# Patient Record
Sex: Male | Born: 1940 | Race: Black or African American | Hispanic: No | Smoking: Former smoker
Health system: Southern US, Community
[De-identification: ages and names within clinical notes are randomized; demographics above are authoritative.]

## PROBLEM LIST (undated history)

## (undated) DIAGNOSIS — H409 Unspecified glaucoma: Secondary | ICD-10-CM

## (undated) DIAGNOSIS — G629 Polyneuropathy, unspecified: Secondary | ICD-10-CM

## (undated) DIAGNOSIS — Z8679 Personal history of other diseases of the circulatory system: Secondary | ICD-10-CM

## (undated) DIAGNOSIS — D649 Anemia, unspecified: Secondary | ICD-10-CM

## (undated) DIAGNOSIS — I252 Old myocardial infarction: Secondary | ICD-10-CM

## (undated) DIAGNOSIS — E119 Type 2 diabetes mellitus without complications: Secondary | ICD-10-CM

## (undated) DIAGNOSIS — E785 Hyperlipidemia, unspecified: Secondary | ICD-10-CM

## (undated) DIAGNOSIS — I10 Essential (primary) hypertension: Secondary | ICD-10-CM

## (undated) DIAGNOSIS — I739 Peripheral vascular disease, unspecified: Secondary | ICD-10-CM

## (undated) DIAGNOSIS — N529 Male erectile dysfunction, unspecified: Secondary | ICD-10-CM

## (undated) DIAGNOSIS — D126 Benign neoplasm of colon, unspecified: Secondary | ICD-10-CM

## (undated) DIAGNOSIS — Z89511 Acquired absence of right leg below knee: Secondary | ICD-10-CM

## (undated) DIAGNOSIS — I679 Cerebrovascular disease, unspecified: Secondary | ICD-10-CM

## (undated) HISTORY — DX: Old myocardial infarction: I25.2

## (undated) HISTORY — DX: Male erectile dysfunction, unspecified: N52.9

## (undated) HISTORY — DX: Anemia, unspecified: D64.9

## (undated) HISTORY — DX: Essential (primary) hypertension: I10

## (undated) HISTORY — DX: Type 2 diabetes mellitus without complications: E11.9

## (undated) HISTORY — DX: Hyperlipidemia, unspecified: E78.5

## (undated) HISTORY — DX: Peripheral vascular disease, unspecified: I73.9

## (undated) HISTORY — DX: Personal history of other diseases of the circulatory system: Z86.79

## (undated) HISTORY — DX: Cerebrovascular disease, unspecified: I67.9

## (undated) HISTORY — DX: Unspecified glaucoma: H40.9

## (undated) HISTORY — PX: CHOLECYSTECTOMY: SHX55

## (undated) HISTORY — DX: Benign neoplasm of colon, unspecified: D12.6

## (undated) HISTORY — DX: Polyneuropathy, unspecified: G62.9

---

## 2000-12-16 HISTORY — PX: CORONARY ARTERY BYPASS GRAFT: SHX141

## 2001-03-11 ENCOUNTER — Encounter: Payer: Self-pay | Admitting: Cardiology

## 2001-03-11 ENCOUNTER — Inpatient Hospital Stay (HOSPITAL_COMMUNITY): Admission: RE | Admit: 2001-03-11 | Discharge: 2001-03-24 | Payer: Self-pay | Admitting: Cardiology

## 2001-03-14 ENCOUNTER — Encounter: Payer: Self-pay | Admitting: Cardiology

## 2001-03-16 ENCOUNTER — Encounter: Payer: Self-pay | Admitting: Thoracic Surgery (Cardiothoracic Vascular Surgery)

## 2001-03-17 ENCOUNTER — Encounter: Payer: Self-pay | Admitting: Thoracic Surgery (Cardiothoracic Vascular Surgery)

## 2001-03-18 ENCOUNTER — Encounter: Payer: Self-pay | Admitting: Surgery

## 2001-04-03 ENCOUNTER — Encounter: Payer: Self-pay | Admitting: Surgery

## 2001-04-03 ENCOUNTER — Inpatient Hospital Stay (HOSPITAL_COMMUNITY): Admission: AD | Admit: 2001-04-03 | Discharge: 2001-04-20 | Payer: Self-pay | Admitting: Surgery

## 2001-04-05 ENCOUNTER — Encounter: Payer: Self-pay | Admitting: Surgery

## 2001-04-06 ENCOUNTER — Encounter: Payer: Self-pay | Admitting: Surgery

## 2001-04-07 ENCOUNTER — Encounter: Payer: Self-pay | Admitting: Surgery

## 2001-04-08 ENCOUNTER — Encounter: Payer: Self-pay | Admitting: Surgery

## 2001-04-09 ENCOUNTER — Encounter: Payer: Self-pay | Admitting: Surgery

## 2001-04-10 ENCOUNTER — Encounter: Payer: Self-pay | Admitting: Surgery

## 2001-04-11 ENCOUNTER — Encounter: Payer: Self-pay | Admitting: Surgery

## 2001-04-12 ENCOUNTER — Encounter: Payer: Self-pay | Admitting: Thoracic Surgery (Cardiothoracic Vascular Surgery)

## 2001-04-13 ENCOUNTER — Encounter: Payer: Self-pay | Admitting: Thoracic Surgery (Cardiothoracic Vascular Surgery)

## 2001-04-13 ENCOUNTER — Encounter: Payer: Self-pay | Admitting: Surgery

## 2001-04-14 ENCOUNTER — Encounter: Payer: Self-pay | Admitting: Surgery

## 2001-04-15 ENCOUNTER — Encounter: Payer: Self-pay | Admitting: Surgery

## 2001-04-16 ENCOUNTER — Encounter: Payer: Self-pay | Admitting: Surgery

## 2001-04-28 ENCOUNTER — Encounter: Payer: Self-pay | Admitting: Surgery

## 2001-04-28 ENCOUNTER — Encounter: Admission: RE | Admit: 2001-04-28 | Discharge: 2001-04-28 | Payer: Self-pay | Admitting: Surgery

## 2001-05-05 ENCOUNTER — Encounter (HOSPITAL_COMMUNITY): Admission: RE | Admit: 2001-05-05 | Discharge: 2001-06-04 | Payer: Self-pay | Admitting: Cardiology

## 2001-05-31 ENCOUNTER — Encounter: Payer: Self-pay | Admitting: Internal Medicine

## 2001-05-31 ENCOUNTER — Emergency Department (HOSPITAL_COMMUNITY): Admission: EM | Admit: 2001-05-31 | Discharge: 2001-05-31 | Payer: Self-pay | Admitting: Internal Medicine

## 2001-06-05 ENCOUNTER — Encounter: Payer: Self-pay | Admitting: Surgery

## 2001-06-05 ENCOUNTER — Inpatient Hospital Stay (HOSPITAL_COMMUNITY): Admission: AD | Admit: 2001-06-05 | Discharge: 2001-06-14 | Payer: Self-pay | Admitting: Surgery

## 2002-12-16 HISTORY — PX: INGUINAL HERNIA REPAIR: SUR1180

## 2003-08-29 ENCOUNTER — Encounter: Payer: Self-pay | Admitting: Cardiology

## 2003-09-14 ENCOUNTER — Ambulatory Visit (HOSPITAL_COMMUNITY): Admission: RE | Admit: 2003-09-14 | Discharge: 2003-09-14 | Payer: Self-pay | Admitting: General Surgery

## 2003-11-17 ENCOUNTER — Ambulatory Visit (HOSPITAL_COMMUNITY): Admission: RE | Admit: 2003-11-17 | Discharge: 2003-11-18 | Payer: Self-pay | Admitting: Ophthalmology

## 2004-10-25 ENCOUNTER — Ambulatory Visit: Payer: Self-pay | Admitting: Cardiology

## 2005-01-09 ENCOUNTER — Ambulatory Visit: Payer: Self-pay | Admitting: Cardiology

## 2005-01-21 ENCOUNTER — Ambulatory Visit: Payer: Self-pay | Admitting: Cardiology

## 2005-01-24 ENCOUNTER — Ambulatory Visit: Payer: Self-pay

## 2005-01-29 ENCOUNTER — Ambulatory Visit: Payer: Self-pay | Admitting: Cardiology

## 2005-07-10 ENCOUNTER — Emergency Department (HOSPITAL_COMMUNITY): Admission: EM | Admit: 2005-07-10 | Discharge: 2005-07-10 | Payer: Self-pay | Admitting: Emergency Medicine

## 2005-09-17 ENCOUNTER — Ambulatory Visit: Payer: Self-pay | Admitting: Cardiology

## 2005-10-02 ENCOUNTER — Ambulatory Visit: Payer: Self-pay | Admitting: *Deleted

## 2005-10-28 ENCOUNTER — Ambulatory Visit: Payer: Self-pay | Admitting: Cardiology

## 2006-02-03 ENCOUNTER — Ambulatory Visit: Payer: Self-pay | Admitting: Cardiology

## 2006-04-10 ENCOUNTER — Ambulatory Visit: Payer: Self-pay | Admitting: Cardiology

## 2006-04-11 ENCOUNTER — Ambulatory Visit (HOSPITAL_COMMUNITY): Admission: RE | Admit: 2006-04-11 | Discharge: 2006-04-11 | Payer: Self-pay | Admitting: Cardiology

## 2006-04-14 ENCOUNTER — Ambulatory Visit: Payer: Self-pay | Admitting: Family Medicine

## 2006-04-28 ENCOUNTER — Ambulatory Visit: Payer: Self-pay | Admitting: Family Medicine

## 2006-05-13 ENCOUNTER — Ambulatory Visit: Payer: Self-pay | Admitting: Family Medicine

## 2006-06-10 ENCOUNTER — Ambulatory Visit: Payer: Self-pay | Admitting: Family Medicine

## 2006-06-12 ENCOUNTER — Ambulatory Visit (HOSPITAL_COMMUNITY): Admission: RE | Admit: 2006-06-12 | Discharge: 2006-06-12 | Payer: Self-pay | Admitting: General Surgery

## 2006-06-12 ENCOUNTER — Encounter (INDEPENDENT_AMBULATORY_CARE_PROVIDER_SITE_OTHER): Payer: Self-pay | Admitting: *Deleted

## 2006-07-15 ENCOUNTER — Ambulatory Visit: Payer: Self-pay | Admitting: Family Medicine

## 2006-07-31 ENCOUNTER — Ambulatory Visit: Payer: Self-pay | Admitting: Cardiology

## 2006-08-15 ENCOUNTER — Ambulatory Visit: Payer: Self-pay | Admitting: Family Medicine

## 2006-09-01 ENCOUNTER — Ambulatory Visit: Payer: Self-pay | Admitting: Cardiology

## 2006-09-12 ENCOUNTER — Ambulatory Visit: Payer: Self-pay | Admitting: Family Medicine

## 2006-12-19 DIAGNOSIS — H409 Unspecified glaucoma: Secondary | ICD-10-CM | POA: Insufficient documentation

## 2006-12-19 DIAGNOSIS — G609 Hereditary and idiopathic neuropathy, unspecified: Secondary | ICD-10-CM | POA: Insufficient documentation

## 2006-12-19 DIAGNOSIS — I10 Essential (primary) hypertension: Secondary | ICD-10-CM | POA: Insufficient documentation

## 2006-12-19 DIAGNOSIS — E785 Hyperlipidemia, unspecified: Secondary | ICD-10-CM

## 2006-12-19 DIAGNOSIS — I252 Old myocardial infarction: Secondary | ICD-10-CM

## 2006-12-19 DIAGNOSIS — I739 Peripheral vascular disease, unspecified: Secondary | ICD-10-CM

## 2007-01-12 ENCOUNTER — Ambulatory Visit: Payer: Self-pay | Admitting: Family Medicine

## 2007-02-02 ENCOUNTER — Encounter (INDEPENDENT_AMBULATORY_CARE_PROVIDER_SITE_OTHER): Payer: Self-pay | Admitting: Family Medicine

## 2007-02-03 ENCOUNTER — Telehealth (INDEPENDENT_AMBULATORY_CARE_PROVIDER_SITE_OTHER): Payer: Self-pay | Admitting: Family Medicine

## 2007-02-03 LAB — CONVERTED CEMR LAB
ALT: 15 units/L (ref 0–53)
Basophils Absolute: 0 10*3/uL (ref 0.0–0.1)
Basophils Relative: 0 % (ref 0–1)
Calcium: 9.6 mg/dL (ref 8.4–10.5)
Chloride: 103 meq/L (ref 96–112)
Cholesterol: 133 mg/dL (ref 0–200)
Creatinine, Ser: 1.08 mg/dL (ref 0.40–1.50)
Glucose, Bld: 75 mg/dL (ref 70–99)
HCT: 42.2 % (ref 39.0–52.0)
Hemoglobin: 13.2 g/dL (ref 13.0–17.0)
LDL Cholesterol: 75 mg/dL (ref 0–99)
Lymphocytes Relative: 34 % (ref 12–46)
Lymphs Abs: 2.1 10*3/uL (ref 0.7–3.3)
MCHC: 31.3 g/dL (ref 30.0–36.0)
MCV: 98.4 fL (ref 78.0–100.0)
Monocytes Absolute: 0.4 10*3/uL (ref 0.2–0.7)
Neutrophils Relative %: 53 % (ref 43–77)
Platelets: 193 10*3/uL (ref 150–400)
Total Bilirubin: 1.4 mg/dL — ABNORMAL HIGH (ref 0.3–1.2)
Triglycerides: 54 mg/dL (ref <150)
VLDL: 11 mg/dL (ref 0–40)

## 2007-02-09 ENCOUNTER — Ambulatory Visit: Payer: Self-pay | Admitting: Family Medicine

## 2007-02-19 ENCOUNTER — Encounter (INDEPENDENT_AMBULATORY_CARE_PROVIDER_SITE_OTHER): Payer: Self-pay | Admitting: Family Medicine

## 2007-03-20 ENCOUNTER — Encounter (INDEPENDENT_AMBULATORY_CARE_PROVIDER_SITE_OTHER): Payer: Self-pay | Admitting: Family Medicine

## 2007-05-20 ENCOUNTER — Encounter (INDEPENDENT_AMBULATORY_CARE_PROVIDER_SITE_OTHER): Payer: Self-pay | Admitting: Family Medicine

## 2007-06-18 ENCOUNTER — Encounter (INDEPENDENT_AMBULATORY_CARE_PROVIDER_SITE_OTHER): Payer: Self-pay | Admitting: Family Medicine

## 2007-06-22 ENCOUNTER — Encounter (INDEPENDENT_AMBULATORY_CARE_PROVIDER_SITE_OTHER): Payer: Self-pay | Admitting: Family Medicine

## 2007-08-14 ENCOUNTER — Encounter (INDEPENDENT_AMBULATORY_CARE_PROVIDER_SITE_OTHER): Payer: Self-pay | Admitting: Family Medicine

## 2007-09-17 ENCOUNTER — Encounter (INDEPENDENT_AMBULATORY_CARE_PROVIDER_SITE_OTHER): Payer: Self-pay | Admitting: Family Medicine

## 2007-09-18 ENCOUNTER — Ambulatory Visit: Payer: Self-pay | Admitting: Family Medicine

## 2007-09-18 LAB — CONVERTED CEMR LAB
Glucose, Bld: 195 mg/dL
Hgb A1c MFr Bld: 6.6 %

## 2007-09-19 ENCOUNTER — Encounter (INDEPENDENT_AMBULATORY_CARE_PROVIDER_SITE_OTHER): Payer: Self-pay | Admitting: Family Medicine

## 2007-09-21 ENCOUNTER — Encounter (INDEPENDENT_AMBULATORY_CARE_PROVIDER_SITE_OTHER): Payer: Self-pay | Admitting: Family Medicine

## 2007-09-21 LAB — CONVERTED CEMR LAB
ALT: 17 units/L (ref 0–53)
AST: 21 units/L (ref 0–37)
Albumin: 4 g/dL (ref 3.5–5.2)
BUN: 11 mg/dL (ref 6–23)
Basophils Absolute: 0 10*3/uL (ref 0.0–0.1)
Basophils Relative: 1 % (ref 0–1)
Calcium: 8.4 mg/dL (ref 8.4–10.5)
Creatinine, Ser: 1.07 mg/dL (ref 0.40–1.50)
Eosinophils Absolute: 0.4 10*3/uL (ref 0.0–0.7)
Glucose, Bld: 194 mg/dL — ABNORMAL HIGH (ref 70–99)
MCV: 94.6 fL (ref 78.0–100.0)
Microalb Creat Ratio: 3.1 mg/g (ref 0.0–30.0)
Microalb, Ur: 0.21 mg/dL (ref 0.00–1.89)
Potassium: 4.3 meq/L (ref 3.5–5.3)
RBC: 4.11 M/uL — ABNORMAL LOW (ref 4.22–5.81)
Total Bilirubin: 1 mg/dL (ref 0.3–1.2)
WBC: 6 10*3/uL (ref 4.0–10.5)

## 2007-10-09 ENCOUNTER — Ambulatory Visit: Payer: Self-pay | Admitting: Family Medicine

## 2007-10-09 DIAGNOSIS — D649 Anemia, unspecified: Secondary | ICD-10-CM | POA: Insufficient documentation

## 2007-10-13 ENCOUNTER — Encounter (INDEPENDENT_AMBULATORY_CARE_PROVIDER_SITE_OTHER): Payer: Self-pay | Admitting: Family Medicine

## 2007-10-13 ENCOUNTER — Telehealth (INDEPENDENT_AMBULATORY_CARE_PROVIDER_SITE_OTHER): Payer: Self-pay | Admitting: *Deleted

## 2007-10-16 ENCOUNTER — Ambulatory Visit (HOSPITAL_COMMUNITY): Admission: RE | Admit: 2007-10-16 | Discharge: 2007-10-16 | Payer: Self-pay | Admitting: Family Medicine

## 2007-10-19 ENCOUNTER — Telehealth (INDEPENDENT_AMBULATORY_CARE_PROVIDER_SITE_OTHER): Payer: Self-pay | Admitting: *Deleted

## 2007-10-19 ENCOUNTER — Ambulatory Visit: Payer: Self-pay | Admitting: Family Medicine

## 2007-10-19 ENCOUNTER — Ambulatory Visit (HOSPITAL_COMMUNITY): Admission: RE | Admit: 2007-10-19 | Discharge: 2007-10-19 | Payer: Self-pay | Admitting: Family Medicine

## 2007-10-19 DIAGNOSIS — K4090 Unilateral inguinal hernia, without obstruction or gangrene, not specified as recurrent: Secondary | ICD-10-CM | POA: Insufficient documentation

## 2007-10-20 ENCOUNTER — Telehealth (INDEPENDENT_AMBULATORY_CARE_PROVIDER_SITE_OTHER): Payer: Self-pay | Admitting: *Deleted

## 2007-11-02 ENCOUNTER — Ambulatory Visit: Payer: Self-pay | Admitting: Family Medicine

## 2007-11-16 ENCOUNTER — Ambulatory Visit: Payer: Self-pay | Admitting: Surgery

## 2007-11-16 ENCOUNTER — Encounter (INDEPENDENT_AMBULATORY_CARE_PROVIDER_SITE_OTHER): Payer: Self-pay | Admitting: Family Medicine

## 2008-01-15 ENCOUNTER — Encounter (INDEPENDENT_AMBULATORY_CARE_PROVIDER_SITE_OTHER): Payer: Self-pay | Admitting: Family Medicine

## 2008-01-21 ENCOUNTER — Encounter (INDEPENDENT_AMBULATORY_CARE_PROVIDER_SITE_OTHER): Payer: Self-pay | Admitting: Family Medicine

## 2008-02-02 ENCOUNTER — Encounter (INDEPENDENT_AMBULATORY_CARE_PROVIDER_SITE_OTHER): Payer: Self-pay | Admitting: Family Medicine

## 2008-02-16 ENCOUNTER — Encounter (INDEPENDENT_AMBULATORY_CARE_PROVIDER_SITE_OTHER): Payer: Self-pay | Admitting: Family Medicine

## 2008-03-13 ENCOUNTER — Emergency Department (HOSPITAL_COMMUNITY): Admission: EM | Admit: 2008-03-13 | Discharge: 2008-03-13 | Payer: Self-pay | Admitting: Emergency Medicine

## 2008-03-16 ENCOUNTER — Encounter (INDEPENDENT_AMBULATORY_CARE_PROVIDER_SITE_OTHER): Payer: Self-pay | Admitting: Family Medicine

## 2008-04-15 ENCOUNTER — Ambulatory Visit: Payer: Self-pay | Admitting: Family Medicine

## 2008-04-15 LAB — CONVERTED CEMR LAB
Blood in Urine, dipstick: NEGATIVE
Hgb A1c MFr Bld: 8 %
WBC Urine, dipstick: NEGATIVE

## 2008-04-25 ENCOUNTER — Encounter (INDEPENDENT_AMBULATORY_CARE_PROVIDER_SITE_OTHER): Payer: Self-pay | Admitting: Family Medicine

## 2008-04-26 LAB — CONVERTED CEMR LAB
ALT: 14 units/L (ref 0–53)
AST: 18 units/L (ref 0–37)
Basophils Relative: 0 % (ref 0–1)
CO2: 26 meq/L (ref 19–32)
Cholesterol: 120 mg/dL (ref 0–200)
Creatinine, Ser: 1.03 mg/dL (ref 0.40–1.50)
Creatinine, Urine: 247.9 mg/dL
Eosinophils Relative: 6 % — ABNORMAL HIGH (ref 0–5)
HCT: 37.4 % — ABNORMAL LOW (ref 39.0–52.0)
Lymphocytes Relative: 36 % (ref 12–46)
Lymphs Abs: 1.9 10*3/uL (ref 0.7–4.0)
MCHC: 31.8 g/dL (ref 30.0–36.0)
MCV: 94.4 fL (ref 78.0–100.0)
Microalb Creat Ratio: 4.2 mg/g (ref 0.0–30.0)
Monocytes Absolute: 0.5 10*3/uL (ref 0.1–1.0)
Monocytes Relative: 9 % (ref 3–12)
Neutro Abs: 2.6 10*3/uL (ref 1.7–7.7)
Neutrophils Relative %: 49 % (ref 43–77)
Platelets: 179 10*3/uL (ref 150–400)
RDW: 13 % (ref 11.5–15.5)
Total CHOL/HDL Ratio: 3
Total Protein: 6.3 g/dL (ref 6.0–8.3)
VLDL: 13 mg/dL (ref 0–40)

## 2008-04-29 ENCOUNTER — Ambulatory Visit: Payer: Self-pay | Admitting: Family Medicine

## 2008-04-29 LAB — CONVERTED CEMR LAB: Glucose, Bld: 216 mg/dL

## 2008-05-27 ENCOUNTER — Ambulatory Visit: Payer: Self-pay | Admitting: Family Medicine

## 2008-05-27 DIAGNOSIS — F528 Other sexual dysfunction not due to a substance or known physiological condition: Secondary | ICD-10-CM | POA: Insufficient documentation

## 2008-05-31 LAB — CONVERTED CEMR LAB
Eosinophils Absolute: 0.2 10*3/uL (ref 0.0–0.7)
Eosinophils Relative: 4 % (ref 0–5)
Hemoglobin: 13.1 g/dL (ref 13.0–17.0)
Lymphs Abs: 1.8 10*3/uL (ref 0.7–4.0)
MCV: 94.4 fL (ref 78.0–100.0)
Monocytes Absolute: 0.5 10*3/uL (ref 0.1–1.0)
Neutrophils Relative %: 57 % (ref 43–77)
Platelets: 197 10*3/uL (ref 150–400)
Retic Ct Pct: 1.5 % (ref 0.4–3.1)
Saturation Ratios: 30 % (ref 20–55)
WBC: 5.7 10*3/uL (ref 4.0–10.5)

## 2008-06-01 ENCOUNTER — Encounter (INDEPENDENT_AMBULATORY_CARE_PROVIDER_SITE_OTHER): Payer: Self-pay | Admitting: Family Medicine

## 2008-08-02 ENCOUNTER — Ambulatory Visit: Payer: Self-pay | Admitting: Family Medicine

## 2008-08-16 ENCOUNTER — Ambulatory Visit: Payer: Self-pay | Admitting: Family Medicine

## 2008-08-30 ENCOUNTER — Encounter (INDEPENDENT_AMBULATORY_CARE_PROVIDER_SITE_OTHER): Payer: Self-pay | Admitting: Family Medicine

## 2008-09-09 ENCOUNTER — Encounter (INDEPENDENT_AMBULATORY_CARE_PROVIDER_SITE_OTHER): Payer: Self-pay | Admitting: Family Medicine

## 2008-11-22 ENCOUNTER — Ambulatory Visit: Payer: Self-pay | Admitting: Family Medicine

## 2008-11-23 ENCOUNTER — Encounter (INDEPENDENT_AMBULATORY_CARE_PROVIDER_SITE_OTHER): Payer: Self-pay | Admitting: Family Medicine

## 2008-11-24 LAB — CONVERTED CEMR LAB
ALT: 53 units/L (ref 0–53)
AST: 46 units/L — ABNORMAL HIGH (ref 0–37)
Albumin: 4.1 g/dL (ref 3.5–5.2)
Alkaline Phosphatase: 94 units/L (ref 39–117)
CO2: 24 meq/L (ref 19–32)
Creatinine, Ser: 0.95 mg/dL (ref 0.40–1.50)
Glucose, Bld: 132 mg/dL — ABNORMAL HIGH (ref 70–99)
PSA: 0.74 ng/mL (ref 0.10–4.00)
Total Bilirubin: 0.9 mg/dL (ref 0.3–1.2)

## 2009-01-11 ENCOUNTER — Encounter (INDEPENDENT_AMBULATORY_CARE_PROVIDER_SITE_OTHER): Payer: Self-pay | Admitting: Family Medicine

## 2009-03-22 ENCOUNTER — Encounter (INDEPENDENT_AMBULATORY_CARE_PROVIDER_SITE_OTHER): Payer: Self-pay | Admitting: Family Medicine

## 2009-03-23 ENCOUNTER — Ambulatory Visit: Payer: Self-pay | Admitting: Family Medicine

## 2009-03-23 LAB — CONVERTED CEMR LAB
Glucose, Bld: 271 mg/dL
Hgb A1c MFr Bld: 7 %

## 2009-04-04 ENCOUNTER — Encounter (INDEPENDENT_AMBULATORY_CARE_PROVIDER_SITE_OTHER): Payer: Self-pay | Admitting: Family Medicine

## 2009-04-05 ENCOUNTER — Encounter (INDEPENDENT_AMBULATORY_CARE_PROVIDER_SITE_OTHER): Payer: Self-pay | Admitting: *Deleted

## 2009-04-05 LAB — CONVERTED CEMR LAB
ALT: 12 units/L (ref 0–53)
AST: 19 units/L (ref 0–37)
BUN: 13 mg/dL (ref 6–23)
CO2: 29 meq/L (ref 19–32)
Calcium: 9.4 mg/dL (ref 8.4–10.5)
Chloride: 102 meq/L (ref 96–112)
Cholesterol: 133 mg/dL (ref 0–200)
Glucose, Bld: 53 mg/dL — ABNORMAL LOW (ref 70–99)
HCT: 40.8 % (ref 39.0–52.0)
HDL: 42 mg/dL (ref >39)
Lymphs Abs: 2.7 10*3/uL (ref 0.7–4.0)
Microalb, Ur: 1.71 mg/dL (ref 0.00–1.89)
Monocytes Absolute: 0.6 10*3/uL (ref 0.1–1.0)
RBC: 4.48 M/uL (ref 4.22–5.81)
RDW: 13.6 % (ref 11.5–15.5)
TSH: 5.253 microintl units/mL — ABNORMAL HIGH (ref 0.350–4.500)
Total CHOL/HDL Ratio: 3.2
VLDL: 12 mg/dL (ref 0–40)
WBC: 6.8 10*3/uL (ref 4.0–10.5)

## 2009-05-02 ENCOUNTER — Encounter (INDEPENDENT_AMBULATORY_CARE_PROVIDER_SITE_OTHER): Payer: Self-pay | Admitting: *Deleted

## 2009-06-23 ENCOUNTER — Ambulatory Visit: Payer: Self-pay | Admitting: Family Medicine

## 2009-06-23 DIAGNOSIS — D126 Benign neoplasm of colon, unspecified: Secondary | ICD-10-CM

## 2009-06-23 LAB — CONVERTED CEMR LAB: Hgb A1c MFr Bld: 6.9 %

## 2009-06-26 LAB — CONVERTED CEMR LAB
Albumin: 4.1 g/dL (ref 3.5–5.2)
Basophils Relative: 0 % (ref 0–1)
CO2: 25 meq/L (ref 19–32)
Chloride: 102 meq/L (ref 96–112)
Eosinophils Absolute: 0.3 10*3/uL (ref 0.0–0.7)
Eosinophils Relative: 4 % (ref 0–5)
Glucose, Bld: 240 mg/dL — ABNORMAL HIGH (ref 70–99)
HCT: 36.9 % — ABNORMAL LOW (ref 39.0–52.0)
Hemoglobin: 12.2 g/dL — ABNORMAL LOW (ref 13.0–17.0)
Monocytes Absolute: 0.5 10*3/uL (ref 0.1–1.0)
Neutrophils Relative %: 62 % (ref 43–77)
RBC: 4.09 M/uL — ABNORMAL LOW (ref 4.22–5.81)
RDW: 12.8 % (ref 11.5–15.5)
Total Bilirubin: 0.9 mg/dL (ref 0.3–1.2)
Total Protein: 6.6 g/dL (ref 6.0–8.3)

## 2009-07-18 ENCOUNTER — Encounter (INDEPENDENT_AMBULATORY_CARE_PROVIDER_SITE_OTHER): Payer: Self-pay | Admitting: Family Medicine

## 2009-07-22 DIAGNOSIS — E119 Type 2 diabetes mellitus without complications: Secondary | ICD-10-CM

## 2009-07-22 DIAGNOSIS — I2510 Atherosclerotic heart disease of native coronary artery without angina pectoris: Secondary | ICD-10-CM | POA: Insufficient documentation

## 2009-08-22 ENCOUNTER — Ambulatory Visit: Payer: Self-pay | Admitting: Cardiology

## 2009-08-22 ENCOUNTER — Encounter (INDEPENDENT_AMBULATORY_CARE_PROVIDER_SITE_OTHER): Payer: Self-pay | Admitting: *Deleted

## 2009-08-22 DIAGNOSIS — I059 Rheumatic mitral valve disease, unspecified: Secondary | ICD-10-CM | POA: Insufficient documentation

## 2009-08-28 ENCOUNTER — Encounter: Payer: Self-pay | Admitting: Cardiology

## 2009-08-28 ENCOUNTER — Ambulatory Visit: Payer: Self-pay | Admitting: Cardiology

## 2009-08-28 ENCOUNTER — Ambulatory Visit (HOSPITAL_COMMUNITY): Admission: RE | Admit: 2009-08-28 | Discharge: 2009-08-28 | Payer: Self-pay | Admitting: Cardiology

## 2009-09-15 ENCOUNTER — Encounter: Payer: Self-pay | Admitting: Cardiology

## 2009-10-04 LAB — CONVERTED CEMR LAB
Basophils Absolute: 0 10*3/uL (ref 0.0–0.1)
Calcium: 9.1 mg/dL (ref 8.4–10.5)
Chloride: 103 meq/L (ref 96–112)
Cholesterol: 186 mg/dL (ref 0–200)
Eosinophils Absolute: 0.4 10*3/uL (ref 0.0–0.7)
Eosinophils Relative: 6 % — ABNORMAL HIGH (ref 0–5)
Glucose, Bld: 50 mg/dL — ABNORMAL LOW (ref 70–99)
HDL: 38 mg/dL — ABNORMAL LOW (ref >39)
Hemoglobin: 12.5 g/dL — ABNORMAL LOW (ref 13.0–17.0)
LDL Cholesterol: 132 mg/dL — ABNORMAL HIGH (ref 0–99)
Lymphocytes Relative: 37 % (ref 12–46)
Lymphs Abs: 2.6 10*3/uL (ref 0.7–4.0)
MCHC: 31.3 g/dL (ref 30.0–36.0)
Neutro Abs: 3.5 10*3/uL (ref 1.7–7.7)
Neutrophils Relative %: 49 % (ref 43–77)
Platelets: 202 10*3/uL (ref 150–400)
Potassium: 3.9 meq/L (ref 3.5–5.3)
Sodium: 142 meq/L (ref 135–145)
Total CHOL/HDL Ratio: 4.9
Triglycerides: 78 mg/dL (ref <150)

## 2009-10-24 ENCOUNTER — Encounter (INDEPENDENT_AMBULATORY_CARE_PROVIDER_SITE_OTHER): Payer: Self-pay | Admitting: *Deleted

## 2009-10-24 ENCOUNTER — Ambulatory Visit: Payer: Self-pay | Admitting: Cardiology

## 2009-11-14 ENCOUNTER — Encounter (INDEPENDENT_AMBULATORY_CARE_PROVIDER_SITE_OTHER): Payer: Self-pay | Admitting: *Deleted

## 2009-11-14 LAB — CONVERTED CEMR LAB
CO2: 28 meq/L
CO2: 28 meq/L (ref 19–32)
Calcium: 9.1 mg/dL (ref 8.4–10.5)
Chloride: 97 meq/L
Chloride: 97 meq/L (ref 96–112)
Glucose, Bld: 302 mg/dL
Potassium: 4.8 meq/L (ref 3.5–5.3)
Sodium: 137 meq/L (ref 135–145)

## 2009-12-20 ENCOUNTER — Ambulatory Visit: Payer: Self-pay | Admitting: Cardiology

## 2009-12-20 ENCOUNTER — Encounter (INDEPENDENT_AMBULATORY_CARE_PROVIDER_SITE_OTHER): Payer: Self-pay | Admitting: Family Medicine

## 2009-12-20 ENCOUNTER — Ambulatory Visit (HOSPITAL_COMMUNITY): Admission: RE | Admit: 2009-12-20 | Discharge: 2009-12-20 | Payer: Self-pay | Admitting: Family Medicine

## 2010-02-02 ENCOUNTER — Encounter (INDEPENDENT_AMBULATORY_CARE_PROVIDER_SITE_OTHER): Payer: Self-pay | Admitting: *Deleted

## 2010-02-02 LAB — CONVERTED CEMR LAB
AST: 18 units/L
Alkaline Phosphatase: 62 units/L
BUN: 13 mg/dL
Bilirubin, Direct: 0.1 mg/dL
Chloride: 103 meq/L
Cholesterol: 141 mg/dL
Creatinine, Ser: 0.89 mg/dL
Hemoglobin: 12.6 g/dL
Sodium: 142 meq/L
Triglycerides: 55 mg/dL

## 2010-02-05 ENCOUNTER — Encounter (INDEPENDENT_AMBULATORY_CARE_PROVIDER_SITE_OTHER): Payer: Self-pay | Admitting: *Deleted

## 2010-06-12 ENCOUNTER — Encounter (INDEPENDENT_AMBULATORY_CARE_PROVIDER_SITE_OTHER): Payer: Self-pay | Admitting: *Deleted

## 2010-07-11 ENCOUNTER — Encounter (INDEPENDENT_AMBULATORY_CARE_PROVIDER_SITE_OTHER): Payer: Self-pay | Admitting: *Deleted

## 2010-07-16 ENCOUNTER — Ambulatory Visit: Payer: Self-pay | Admitting: Cardiology

## 2010-07-16 DIAGNOSIS — R0989 Other specified symptoms and signs involving the circulatory and respiratory systems: Secondary | ICD-10-CM | POA: Insufficient documentation

## 2010-07-19 ENCOUNTER — Ambulatory Visit (HOSPITAL_COMMUNITY): Admission: RE | Admit: 2010-07-19 | Discharge: 2010-07-19 | Payer: Self-pay | Admitting: Cardiology

## 2010-07-19 ENCOUNTER — Encounter: Payer: Self-pay | Admitting: Cardiology

## 2010-07-19 ENCOUNTER — Ambulatory Visit: Payer: Self-pay | Admitting: Cardiology

## 2010-07-23 ENCOUNTER — Ambulatory Visit: Payer: Self-pay | Admitting: Cardiology

## 2010-07-30 ENCOUNTER — Encounter (INDEPENDENT_AMBULATORY_CARE_PROVIDER_SITE_OTHER): Payer: Self-pay | Admitting: *Deleted

## 2010-08-22 LAB — CONVERTED CEMR LAB
HDL: 43 mg/dL (ref >39)
LDL Cholesterol: 82 mg/dL (ref 0–99)

## 2010-08-27 ENCOUNTER — Encounter (INDEPENDENT_AMBULATORY_CARE_PROVIDER_SITE_OTHER): Payer: Self-pay | Admitting: *Deleted

## 2011-01-17 NOTE — Miscellaneous (Signed)
Summary: cbc,cmp,liver 02/02/2010  Clinical Lists Changes  Observations: Added new observation of CALCIUM: 9.3 mg/dL (40/98/1191 47:82) Added new observation of ALBUMIN: 4.1 g/dL (95/62/1308 65:78) Added new observation of PROTEIN, TOT: 6.5 g/dL (46/96/2952 84:13) Added new observation of SGPT (ALT): 9 units/L (02/02/2010 13:38) Added new observation of SGOT (AST): 18 units/L (02/02/2010 13:38) Added new observation of ALK PHOS: 62 units/L (02/02/2010 13:38) Added new observation of BILI DIRECT: 0.1 mg/dL (24/40/1027 25:36) Added new observation of CREATININE: 0.89 mg/dL (64/40/3474 25:95) Added new observation of BUN: 13 mg/dL (63/87/5643 32:95) Added new observation of BG RANDOM: 49 mg/dL (18/84/1660 63:01) Added new observation of CO2 PLSM/SER: 28 meq/L (02/02/2010 13:38) Added new observation of CL SERUM: 103 meq/L (02/02/2010 13:38) Added new observation of K SERUM: 3.8 meq/L (02/02/2010 13:38) Added new observation of NA: 142 meq/L (02/02/2010 13:38) Added new observation of LDL: 82 mg/dL (60/09/9322 55:73) Added new observation of HDL: 48 mg/dL (22/01/5426 06:23) Added new observation of TRIGLYC TOT: 55 mg/dL (76/28/3151 76:16) Added new observation of CHOLESTEROL: 141 mg/dL (07/37/1062 69:48) Added new observation of PLATELETK/UL: 217 K/uL (02/02/2010 13:38) Added new observation of MCV: 94.8 fL (02/02/2010 13:38) Added new observation of HCT: 40.3 % (02/02/2010 13:38) Added new observation of HGB: 12.6 g/dL (54/62/7035 00:93) Added new observation of WBC COUNT: 6.2 10*3/microliter (02/02/2010 13:38) Added new observation of HGBA1C: 6.8 % (02/02/2010 13:38)

## 2011-01-17 NOTE — Letter (Signed)
Summary: Appointment - Missed  [facility] [place]  [address] [place] [address]   Phone: [phone]  Fax: [phone]     July 30, 2010 MRN: [id number]   Marcus Hendrix [address] [place], [place]  [address]   Dear Mr. Spangler,  Our records indicate you missed your appointment on          07/30/10 NURSE VISIT                            It is very important that we reach you to reschedule this appointment. We look forward to participating in your health care needs. Please contact us at the number listed above at your earliest convenience to reschedule this appointment.     Sincerely,    Glass blower/designer

## 2011-01-17 NOTE — Miscellaneous (Signed)
Summary: LABS BMP 11/14/2009  Clinical Lists Changes  Observations: Added new observation of CALCIUM: 9.1 mg/dL (21/30/8657 84:69) Added new observation of CREATININE: 1.00 mg/dL (62/95/2841 32:44) Added new observation of BUN: 14 mg/dL (12/18/7251 66:44) Added new observation of BG RANDOM: 302 mg/dL (03/47/4259 56:38) Added new observation of CO2 PLSM/SER: 28 meq/L (11/14/2009 17:37) Added new observation of CL SERUM: 97 meq/L (11/14/2009 17:37) Added new observation of K SERUM: 4.8 meq/L (11/14/2009 17:37) Added new observation of NA: 137 meq/L (11/14/2009 17:37)

## 2011-01-17 NOTE — Letter (Signed)
Summary: [place] [facility] Work Engineer, agricultural at [facility]  [address] [place] [address]   Phone: [phone]  Fax: [phone]     July 30, 2010 MRN: [id number]   Marcus Hendrix [address] [place], [place]  [address]      YOUR LAB WORK IS DUE   August 30, 2010  Please go to [facility], located across the street from [facility] on the second floor.  Hours are Monday - Friday 7am until 7:30pm         Saturday 8am until 12noon    _X_  DO NOT EAT OR DRINK AFTER MIDNIGHT EVENING PRIOR TO LABWORK  __ YOUR LABWORK IS NOT FASTING --YOU MAY EAT PRIOR TO LABWORK

## 2011-01-17 NOTE — Letter (Signed)
Summary: Appointment - Reminder 2  [facility] [place]. [address] [place] [address]   Phone: [phone]  Fax: [phone]     June 12, 2010 MRN: [id number]   Marcus Hendrix [address] [place], [place]  [address]   Dear Mr. Mode,  Our records indicate that it is time to schedule a follow-up appointment.  Dr.  Dietrich Pates        recommended that you follow up with [place] in  MAY 2011          . It is very important that we reach you to schedule this appointment. We look forward to participating in your health care needs. Please contact us at the number listed above at your earliest convenience to schedule your appointment.  If you are unable to make an appointment at this time, give [place] a call so we can update our records.     Sincerely,   Glass blower/designer

## 2011-01-17 NOTE — Assessment & Plan Note (Signed)
Summary: Reassessment of Htn  Nurse Visit   Vital Signs:  Patient profile:   70 year old male Height:      69 inches Weight:      199 pounds O2 Sat:      95 % on Room air Temp:     97.1 degrees F oral Pulse rate:   66 / minute Resp:     66 per minute BP sitting:   141 / 66  (left arm)  Vitals Entered By: Teressa Lower RN (July 23, 2010 8:30am  O2 Flow:  Room air   Visit Type:  1 week nurse visit Primary Malcolm Hetz:  [name]   History of Present Illness: S: 1 week nurse visit B: ov 07/16/10, started doxazosin2mg  will increase to 4mg  07/24/10, had a little weakness the first day but no further problems, beware that pt is on simvastatin 80mg  and amlodipine 10mg  daily A: no c/o R:  07/29/10  Continue plan re: HTN. D/C simvastatin. Pravastatin 80 mg once daily FLP in 1 month.  [place] Bing, M.D.  I discussed medication changes with pt, and labwork. Teressa Lower RN  July 30, 2010 11:52 AM   Current Medications (verified): 1)  Lantus 100 Unit/ml  Soln (Insulin Glargine) .... 35 Units in Am and 35 Units At Bedtime 2)  Lasix 40 Mg Tabs (Furosemide) .[name].. 1 By Mouth Once Daily 3)  Baby Aspirin 81 Mg Chew (Aspirin) .[name].. 1 By Mouth Once Daily 4)  Lopressor 100 Mg Tabs (Metoprolol Tartrate) .[name].. 1 By Mouth Two Times A Day 5)  Simvastatin 80 Mg Tabs (Simvastatin) .... Take One Tablet By Mouth Daily At Bedtime 6)  Klor-Con 20 Meq Pack (Potassium Chloride) .[name].. 1 By Mouth Once Daily 7)  Timoptic 0.25 % Soln (Timolol Maleate) .... Once Daily 8)  Norvasc 10 Mg Tabs (Amlodipine Besylate) .[name].. 1 By Mouth Once Daily 9)  Metformin Hcl 1000 Mg  Tabs (Metformin Hcl) .... Two Times A Day 10)  Neurontin 300 Mg Caps (Gabapentin) .... One By Mouth Twice Daily 11)  Dm Shoes .... Pvd and Dm 12)  One Touch Ultra 2 .... Pt Has Glucometer and Needs Testing Supplies Including Needles, Etoh Swabs, Test Strips. Dx: 250.7. Check Fsbs Once Daily 13)  Lisinopril 20 Mg Tabs (Lisinopril) ....  Take One Tablet By Mouth Daily 14)  Doxazosin Mesylate 4 Mg Tabs (Doxazosin Mesylate) .... Take 1 Tablet By Mouth Once A Day  Allergies (verified): 1)  ! * Clonidine  Orders Added: 1)  T-Lipid Profile [[id number]] Prescriptions: PRAVASTATIN SODIUM 80 MG TABS (PRAVASTATIN SODIUM) Take one tablet by mouth daily at bedtime  #30 x 6   Entered by:   Teressa Lower RN   Authorized by:   Kathlen Brunswick, MD, [facility]   Signed by:   Teressa Lower RN on 07/30/2010   Method used:   Electronically to        [facility] (retail)       [address]       [place], [place]  [address]       Ph: [phone]       Fax: [phone]   RxID:   [id number]

## 2011-01-17 NOTE — Letter (Signed)
Summary: [place] Results Engineer, agricultural at [facility]  [address], [place] [address]   Phone: [phone]  Fax: [phone]      February 05, 2010 MRN: [id number]   Marcus Hendrix [address] [place], [place]  [address]   Dear Mr. Lozoya,  Your test ordered by Selena Batten has been reviewed by your physician (or physician assistant) and was found to be normal or stable. Your physician (or physician assistant) felt no changes were needed at this time.  __x__ Echocardiogram  ____ Cardiac Stress Test  ____ Lab Work  ____ Peripheral vascular study of arms, legs or neck  ____ CT scan or X-ray  ____ Lung or Breathing test  ____ Other:  No change in medical treatment at this time, per [name].   Thank you, Tasheika Kitzmiller Allyne Gee RN    [place] Bing, MD, Lenise Arena.C.Gaylord Shih, MD, F.A.C.C Lewayne Bunting, MD, F.A.C.C Nona Dell, MD, F.A.C.C Charlton Haws, MD, Lenise Arena.C.C

## 2011-01-17 NOTE — Letter (Signed)
Summary: [place] Results Engineer, agricultural at [facility]  [address], [place] [address]   Phone: [phone]  Fax: [phone]      August 27, 2010 MRN: [id number]   Marcus Hendrix [address] [place], [place]  [address]   Dear Mr. Goldwater,  Your test ordered by Selena Batten has been reviewed by your physician (or physician assistant) and was found to be normal or stable. Your physician (or physician assistant) felt no changes were needed at this time.  __x__ Echocardiogram  ____ Cardiac Stress Test  _x___ Lab Work  __x__ Peripheral vascular study of arms, legs or neck  ____ CT scan or X-ray  ____ Lung or Breathing test  ____ Other:  No change in medical treatment at this time, per [name].  Enclosed is a copy of your labwork for your records.  Thank you, Tammy Allyne Gee RN    [place] Bing, MD, Lenise Arena.C.Gaylord Shih, MD, F.A.C.C Lewayne Bunting, MD, F.A.C.C Nona Dell, MD, F.A.C.C Charlton Haws, MD, Lenise Arena.C.C

## 2011-01-17 NOTE — Assessment & Plan Note (Signed)
Summary: past due for 6 mth f/u per pt phone call/tg   Primary Provider:  [name]   History of Present Illness: Marcus Hendrix has done well since his last visit 9 months ago.  He denies chest discomfort, dyspnea on exertion, orthopnea, or pedal edema; however, he reports symptoms compatible with PND.  On occasion, he awakens at night with a sense of panic, palpitations and a subjective tachycardia and dyspnea without impressive air hunger.  He sits up and relaxes for a few minutes resulting in relief of symptoms.  Episodes typically occur once every month or 2.  Patient reports that he has developed fluid retention in the past when he has not taken furosemide on a daily basis.  Current Medications (verified): 1)  Lantus 100 Unit/ml  Soln (Insulin Glargine) .... 35 Units in Am and 35 Units At Bedtime 2)  Lasix 40 Mg Tabs (Furosemide) .[name].. 1 By Mouth Once Daily 3)  Baby Aspirin 81 Mg Chew (Aspirin) .[name].. 1 By Mouth Once Daily 4)  Lopressor 100 Mg Tabs (Metoprolol Tartrate) .[name].. 1 By Mouth Two Times A Day 5)  Simvastatin 80 Mg Tabs (Simvastatin) .... Take One Tablet By Mouth Daily At Bedtime 6)  Klor-Con 20 Meq Pack (Potassium Chloride) .[name].. 1 By Mouth Once Daily 7)  Timoptic 0.25 % Soln (Timolol Maleate) .... Once Daily 8)  Norvasc 10 Mg Tabs (Amlodipine Besylate) .[name].. 1 By Mouth Once Daily 9)  Metformin Hcl 1000 Mg  Tabs (Metformin Hcl) .... Two Times A Day 10)  Neurontin 300 Mg Caps (Gabapentin) .... One By Mouth Twice Daily 11)  Dm Shoes .... Pvd and Dm 12)  One Touch Ultra 2 .... Pt Has Glucometer and Needs Testing Supplies Including Needles, Etoh Swabs, Test Strips. Dx: 250.7. Check Fsbs Once Daily 13)  Lisinopril 20 Mg Tabs (Lisinopril) .... Take One Tablet By Mouth Daily 14)  Doxazosin Mesylate 4 Mg Tabs (Doxazosin Mesylate) .... Take 1/2 Tablet Daily X1 Week Then Increase To 1 Tablet Daily  Allergies (verified): 1)  ! * Clonidine  Past History:  PMH, FH, and Social  History reviewed and updated.  Review of Systems       See history of present illness.  Vital Signs:  Patient profile:   70 year old male Height:      69 inches Weight:      195 pounds BMI:     28.90 O2 Sat:      97 % on Room air Pulse rate:   69 / minute BP sitting:   172 / 78  (left arm)  Vitals Entered By: Dreama Saa, CNA (July 16, 2010 2:19 PM)  O2 Flow:  Room air  Physical Exam  General:  Proportionate weight and height; well-developed; no acute distress;    Neck: No JVD; bilateral carotid bruits;  Lungs: No tachypnea, clear without rales, rhonchi or wheezes;  Cardiovascular: normal PMI; normal S1 and S2; grade 3/6 holosystolic murmur at the left sternal border and apex; no systolic click appreciated Abdomen: BS normal; soft and non-tender without masses or organomegaly; no bruits Musculoskeletal: No deformities, no cyanosis or clubbing;    Neurologic: Normal cranial nerves; symmetric strength and tone;  Skin:  Warm, no significant lesions;  Extremities: No edema; 1-2+ distal pulses.     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  Problem # 2:  MITRAL VALVE PROLAPSE (ICD-424.0) Murmur is prominent.  Cardiac structure and function has not been evaluated since echocardiography in 2007.  A repeat study will be performed.  Problem # 3:  ATHEROSCLEROTIC CARDIOVASCULAR DISEASE (ICD-429.2) Mr. Situ has done very well since recovering from postoperative complications following CABG including a sternal infection.  None of his current symptoms really suggest recurrent myocardial ischemia.  Problem # 4:  HYPERTENSION (ICD-401.9) Blood pressure control is not good despite use of 5 antihypertensive agents.  Doxazosin will be introduced at low dose and titrated upwards until blood pressure is controlled or maximal dose reached.  Patient will followup with  the cardiology nurses for adjustment of these medications.  I will see him again in one year.  Other Orders: 2-D Echocardiogram (2D Echo) Carotid Duplex (Carotid Duplex)  Patient Instructions: 1)  Your physician recommends that you schedule a follow-up appointment in: 6 months 2)  Your physician has recommended you make the following change in your medication: doxazosin 4mg   1/2 tablet by mouth daily x 1 week then increase to 1 tablet daily 3)  You have been referred to nurse visit in 1 week 4)  Your physician has requested that you have a carotid duplex. This test is an ultrasound of the carotid arteries in your neck. It looks at blood flow through these arteries that supply the brain with blood. Allow one hour for this exam. There are no restrictions or special instructions. 5)  Your physician has requested that you have an echocardiogram.  Echocardiography is a painless test that uses sound waves to create images of your heart. It provides your doctor with information about the size and shape of your heart and how well your heart's chambers and valves are working.  This procedure takes approximately one hour. There are no restrictions for this procedure. Prescriptions: DOXAZOSIN MESYLATE 4 MG TABS (DOXAZOSIN MESYLATE) take 1/2 tablet daily x1 week then increase to 1 tablet daily  #30 x 6   Entered by:   Teressa Lower RN   Authorized by:   Kathlen Brunswick, MD, [facility]   Signed by:   Teressa Lower RN on 07/16/2010   Method used:   Electronically to        [facility] (retail)       [address]       [place], [place]  [address]       Ph: [phone]       Fax: [phone]   RxID:   [id number]    Prevention & Chronic Care Immunizations   Influenza vaccine: Advised HD as we are out  (11/22/2008)   Influenza vaccine due: 11/2009    Tetanus booster: Not documented    Pneumococcal vaccine: Hx of  (09/15/2006)    H. zoster vaccine: Not  documented  Colorectal Screening   Hemoccult: Not documented    Colonoscopy: abnormal - 3 polyps  (05/29/2006)   Colonoscopy due: 05/2009  Other Screening   PSA: 0.74  (11/23/2008)   Smoking status: quit  (03/23/2009)    Screening comments: quit in 1987  Diabetes Mellitus   HgbA1C: 6.8  (02/02/2010)    Eye exam: Advised  (08/02/2008)    Foot exam: yes  (06/23/2009)   High risk foot: Not documented   Foot care education: Not documented    Urine microalbumin/creatinine ratio: 16.6  (04/04/2009)  Lipids   Total Cholesterol: 141  (02/02/2010)   LDL: 82  (02/02/2010)   LDL Direct: Not documented   HDL: 48  (  02/02/2010)   Triglycerides: 55  (02/02/2010)    SGOT (AST): 18  (02/02/2010)   SGPT (ALT): 9  (02/02/2010)   Alkaline phosphatase: 62  (02/02/2010)   Total bilirubin: 0.7  (10/04/2009)  Hypertension   Last Blood Pressure: 172 / 78  (07/16/2010)   Serum creatinine: 0.89  (02/02/2010)   Serum potassium 3.8  (02/02/2010)  Self-Management Support :    Diabetes self-management support: Not documented    Hypertension self-management support: Not documented    Lipid self-management support: Not documented

## 2011-03-14 ENCOUNTER — Other Ambulatory Visit: Payer: Self-pay | Admitting: Cardiology

## 2011-04-13 ENCOUNTER — Other Ambulatory Visit: Payer: Self-pay | Admitting: Cardiology

## 2011-04-15 ENCOUNTER — Other Ambulatory Visit: Payer: Self-pay

## 2011-04-15 MED ORDER — PRAVASTATIN SODIUM 80 MG PO TABS: 80.0000 mg | ORAL_TABLET | Freq: Every day | ORAL | Status: DC

## 2011-04-30 NOTE — Assessment & Plan Note (Signed)
OFFICE VISIT   Marcus Hendrix, Marcus Hendrix  DOB:  02/07/1940                                       11/16/2007  CHART#:[id number]   REASON FOR CONSULT:  Bilateral claudication.   HISTORY:  This is a 70 year old gentleman I am seeing at the request of  Dr. Erby Pian for evaluation of bilateral lower extremity claudication.  The patient states that he experiences cramping pain and a feeling of  his legs giving out and in his buttocks ranging down to his calf  beginning at about 100 feet.  He denies having any rest pain.  He denies  having any ulcers.  He has had a scab on his left leg that has been  there for approximately 1 month but has slowly been healing.  Patient  also has a significant cardiac history.  He had a myocardial infarction  approximately 5 years ago and at that time underwent coronary artery  bypass graft x6.  Since that time, he has not had any episodes of chest  pain.  Patient is also diabetic, taking Metformin as well as Humulin.  He says that his glucoses are under good control, but when further  questioned he says that he does not check because he is afraid of what  they might show.  However, he says that he notes he becomes symptomatic  when they get high or low and he has not had any episodes.   REVIEW OF SYSTEMS:  GENERAL:  No weight gain.  No weight loss.  CARDIAC:  Negative.  PULMONARY:  Negative.  GI:  Negative.  GU:  Negative.  VASCULAR:  As above.  NEURO:  Negative.  ORTHO:  Negative.  PSYCHE:  Negative.  ENT:  Negative.  HEMATOLOGIC:  Negative.   PAST MEDICAL HISTORY:  1. Diabetes x25 years.  2. Hypertension.  3. History of MI.  4. Hypercholesterolemia.  5. History of pneumonia.   PAST SURGICAL HISTORY:  1. CABG x6.  2. Cholecystectomy.   FAMILY HISTORY:  History of MI in his mother.   SOCIAL HISTORY:  He is married with children.  He does not currently  smoke.  He has a history of smoking but quit 10-15 years ago.  He  does  not drink alcohol on a regular basis.   MEDICATIONS:  Include Metoprolol, Amlodipine, Gabapentin, Lasix,  Lipitor, Metformin, Klor-Con, Humulin and Catapres.   ALLERGIES:  None.   PHYSICAL EXAMINATION:  Vital Signs:  Heart rate 66.  Blood pressure  169/83.  Oxygen saturation 99%.  General:  He is well-appearing, in no  acute distress.  HEENT:  He is normocephalic, atraumatic.  His sclerae  are anicteric.  Neck:  There is no JVD.  There are no carotid bruits.  There are no masses.  Cardiovascular:  Regular rate and rhythm with a  systolic ejection murmur.  Pulmonary:  His lungs are clear to  auscultation bilaterally.  There are no crackles and no wheezes.  Abdomen:  Soft, nontender.  No hepatosplenomegaly.  No pulsatile mass.  Incisions are well-healed.  Extremities:  Warm and well-perfused.  There  is no clubbing, cyanosis or edema.  He has a 2+ right femoral and 1+  left femoral pulse.  No pulses were palpable in his popliteal or pedal  vessels.  Psyche:  He is alert and oriented x3.  Neuro:  Cranial nerves  II-XII are grossly intact.  Skin:  There are no rashes.   DIAGNOSTIC STUDIES:  I reviewed the patient's CT angiogram today.  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  Duplex study was performed today which shows ankobrachial  indices of 0.49 on the right and 0.67 on the left.   ASSESSMENT:  Bilateral claudication.   PLAN:  I had an extensive conversation with the patient outlining our  treatment options at this time.  We discussed surgical versus  percutaneous options as well as the potential risks and benefits.  At  this point in time, patient wishes to proceed with an intervention,  given the significant lifestyle changes he is having from his  claudication.  We have decided to proceed with lower extremity angiogram  with a possibility of intervening.  On CT scan, the patient has  bilateral in-flow of disease in addition to  his bilateral SFA  occlusions.  There is a severe narrowing at the origin of the left  common iliac as well as stenosis in the right common iliac with a small  focal dissection.  Bilateral and superficial femoral arteries are  occluded.  My plan at this time is to proceed with correction of his in-  flow vessels.  If feasible, access will be from the left side given the  dissection on the right.  He will likely need bilateral access to  correct his in-flow.  This will be scheduled within the next 2-3 weeks.   Jorge Ny, MD  Electronically Signed   VWB/MEDQ  D:  11/16/2007  T:  11/17/2007  Job:  [id number]   cc:   Franchot Heidelberg, M.D.

## 2011-05-03 NOTE — Op Note (Signed)
[place]  Patient:    Marcus Hendrix, Marcus Hendrix                    MRN: [id number] Adm. Date:  [id number] Attending:  Cleatrice Burke                           Operative Report  PREOPERATIVE DIAGNOSIS:  Sternal wound infection.  POSTOPERATIVE DIAGNOSIS:  Sternal wound infection.  PROCEDURE:  Debridement of sternal wound infection.  SURGEON:  Alleen Borne, M.D.  ANESTHESIA:  General endotracheal.  CLINICAL HISTORY:  The patient is a 70 year old diabetic gentleman who is approximately three weeks status post coronary artery bypass graft surgery. The patient presented with drainage of purulent-appearing fluid from the lower portion of his chest incision.  He had a several-day history of chills and sweating prior to this.  He was noted to have a white blood cell count of 19,000 on admission.  CT scan of the chest was obtained, which did not show any evidence of subcutaneous or peristernal fluid collections or gas.  The sternum appeared intact.  There were no pleural or pericardial effusions.  A Gram stain of the wound drainage showed gram-positive cocci.  The patient was treated with intravenous vancomycin and Cipro, and wound dressings were started.  Within 24 hours, the drainage continued and the wound started to open up more.  I was concerned that there may be a deeper infection despite the CT scan findings.  I felt the best treatment was to take the patient to the operating room for further exploration of the chest wound under general anesthesia.  I discussed the operative procedure with the patient and his wife and son, including possibility of completely opening the chest wound and possible sternal debridement depending on the operative findings.  I discussed alternatives, benefits, and risks, including bleeding, possible blood transfusion, nonhealing of the wound, and they understood and agreed to proceed.  DESCRIPTION OF PROCEDURE:  The  patient was taken to the operating room and placed on the table in supine position.  After induction of general endotracheal anesthesia, the neck, chest, and abdomen were prepped with Betadine soap and solution and draped in the usual sterile manner.  The chest incision was opened along its entire extent.  There was a large amount of pus present in the subcutaneous tissues just above the sternum.  The superficial subcutaneous tissue appeared to be healing.  The sternum was intact.  The bone appeared viable.  There was some pus coming from above the sternal notch, and therefore the sternal wires were removed.  The sternal edges were slightly separated when the wires were removed.  There was pus underneath the sternum also.  This was completely removed.  The wound was then irrigated with 3 L of saline solution using the Hydrojet irrigator.  The tissues all appeared viable.  The wound was packed with normal saline wet-to-dry Kerlix gauze and covered with a dry dressing.  The sponge, needle, and instrument counts were correct according to the scrub nurse.  The patient was then transported to the postanesthesia care unit intubated, in satisfactory and stable condition. DD:  04/06/01 TD:  04/07/01 Job: [id number]

## 2011-05-03 NOTE — H&P (Signed)
NAME:  Marcus Hendrix, Marcus Hendrix                       ACCOUNT NO.:  [id number]   MEDICAL RECORD NO.:  [id number]                   PATIENT TYPE:  AMB   LOCATION:  DAY                                  FACILITY:  [facility]   PHYSICIAN:  Dalia Heading, M.D.               DATE OF BIRTH:  02/07/1940   DATE OF ADMISSION:  DATE OF DISCHARGE:                                HISTORY & PHYSICAL   CHIEF COMPLAINT:  Left inguinal hernia.   HISTORY OF PRESENT ILLNESS:  The patient is a 70 year old black male who was  referred for evaluation and treatment of a left inguinal hernia.  It has  been present for many months, but recently has increased in size and is  causing him discomfort.  No nausea or vomiting have been noted.  It is made  worse with straining.   PAST MEDICAL HISTORY:  1. Coronary artery disease.  2. Hypertension.  3. Insulin-dependent diabetes mellitus.   PAST SURGICAL HISTORY:  CABG two years ago.   CURRENT MEDICATIONS:  1. Norvasc 10 mg p.o. daily.  2. Lipitor 10 mg p.o. daily.  3. __________ 3 mg p.o. daily.  4. A fluid pill 20 mg p.o. daily.  5. Potassium supplements 10 mEq p.o. daily.  6. Insulin 40 units.  7. Humulin L daily.   ALLERGIES:  No known drug allergies.   REVIEW OF SYSTEMS:  The patient does have a history of smoking.  He denies  any recent chest pain, MI, shortness of breath, CVA, or bleeding disorder.   PHYSICAL EXAMINATION:  GENERAL:  The patient is a well-developed, well-  nourished, black male in no acute distress.  VITAL SIGNS:  He is afebrile and vital signs are stable.  LUNGS:  Clear to auscultation with equal breath sounds bilaterally.  HEART:  A regular rate and rhythm with a 2/6 systolic ejection murmur.  No  S3 or S4 are noted.  ABDOMEN:  Soft, nontender, and nondistended.  No hepatosplenomegaly or  masses are noted.  A reducible left inguinal hernia is noted.  No right  inguinal hernia is noted.  GENITOURINARY:  Within normal limits.   IMPRESSION:  Left inguinal hernia.   PLAN:  The patient is scheduled for left inguinal herniorrhaphy on September 14, 2003.  The risks and benefits of the procedure, including bleeding,  infection, and the recurrence of the hernia, were fully explained to the  patient who gave informed consent.  Vicodin has been prescribed  preoperatively.     ___________________________________________                                         Dalia Heading, M.D.   MAJ/MEDQ  D:  09/13/2003  T:  09/13/2003  Job:  [id number]   cc:   Ramon Dredge L. Juanetta Gosling, M.D.  [phone]  [address]  [place]  [place] [address]  Fax: [phone]

## 2011-05-03 NOTE — Consult Note (Signed)
[place]. [facility]  Patient:    KASEM, MOZER                    MRN: [id number] Proc. Date: 04/06/01 Adm. Date:  [id number] Attending:  Cleatrice Burke CC:         Alleen Borne, M.D.   Consultation Report  CHIEF COMPLAINT:  Sternal wound dehiscence and infection.  HISTORY OF PRESENT ILLNESS:  This 70 year old man underwent a CABG a month ago.  He apparently has a history of diabetes type 1, insulin dependent, with a significant smoking history.  He had CABG x 6 and this was secondary to severe three-vessel coronary artery disease.  He did well initially but presented at the second office visit with sternal tenderness and appeared to have some drainage.  He was hospitalized and started on IV vancomycin and underwent a debridement yesterday.  There was a significant amount of purulence found within the wound and the bone has separated.  He has had fevers both preoperatively and postoperatively but apparently those have improved today.  Dressing changes also started today.  PAST MEDICAL HISTORY:  Positive for coronary artery disease, as stated, also diabetes mellitus type 1.  No other history available and he is unable to provide any further history, since he is intubated and sedated today.  MEDICATIONS:  1. Digoxin.  2. Lopressor.  3. Lasix.  4. K-Dur.  5. Aspirin.  6. Mavik.  7. Norvasc.  8. Insulin.  9. Humulin. 10. Timolol.  PAST SURGICAL HISTORY:  CABG x 6, also had a cholecystectomy.  Apparently, this was laparoscopic.  PHYSICAL EXAMINATION:  CHEST:  Physical exam limited to chest.  There is an open wound on the sternum.  The sternum is separated.  The wound appears to be fairly clean.  No evidence of any purulence.  RECOMMENDATIONS:  The recommendation is for a sternal debridement and bilateral pectoralis muscle flaps.  Today, I have talked to his wife extensively over the phone.  We have gone over the nature of the  procedure, the risks, and the possible complications including death, loss of the muscle flaps, wound healing problems, infection, among others.  She understands this is a high-risk operation and that it may not be successful.  She wishes to proceed with the surgery, which will be scheduled in coordination with Dr. Garen Grams schedule for two days from now. DD:  04/06/01 TD:  04/07/01 Job: [id number]

## 2011-05-03 NOTE — Op Note (Signed)
NAME:  Marcus Hendrix, Marcus Hendrix NO.:  [id number]   MEDICAL RECORD NO.:  [id number]                   PATIENT TYPE:  OIB   LOCATION:  5714                                 FACILITY:  [facility]   PHYSICIAN:  Beulah Gandy. Ashley Royalty, M.D.              DATE OF BIRTH:  02/07/1940   DATE OF PROCEDURE:  11/17/2003  DATE OF DISCHARGE:  11/18/2003                                 OPERATIVE REPORT   PREOPERATIVE DIAGNOSES:  Proliferative diabetic retinopathy, vitreous  hemorrhage left eye and glaucoma, left eye.   POSTOPERATIVE DIAGNOSIS:  Proliferative diabetic retinopathy, vitreous  hemorrhage left eye and glaucoma, left eye.   SURGEON:  Beulah Gandy. Ashley Royalty, M.D.   PROCEDURES:  1. Pars plana vitrectomy, left eye.  2. Retinal photocoagulation, left eye.  3. Membrane peel, left eye.   SURGEON:  Beulah Gandy. Ashley Royalty, M.D.   ASSISTANT:  Merian Capron, M.A.   ANESTHESIA:  General.   DESCRIPTION OF PROCEDURE:  Usual prep and drape. Peritomies at 10, 2 and 4  o'clock. The 4-mm angled infusion port was anchored in place at 4 o'clock.  The lighted pick and the cutter were placed at 10 and 2 o'clock  respectively. The __________ was moved into place and Provisc was placed on  the corneal surface.   The pars plana vitrectomy was begun just behind the crystalline lens. A 2+  nuclear sclerotic cataract was seen. Blood and vitreous were prominent in  the vitreous cavity. Old white blood and very little fresh blood was seen.  The old white blood was attached to the macular region and the disk with the  vitreous membranes. The lighted pick was used to engage this ring of  membranes and lift it free from its attachments to the disk and the macular  region. It came away cleanly and clearly.   The vitrectomy was carried to the arcades and then to the equator without  complications. Scleral depression was used to remove all blood down to the  vitreous base for 360 degrees. Once this was  accomplished, the endolaser was  positioned in the eye and 1188 burns  were placed around the retinal  periphery. The power was 460 milliwatts, 1000 microns each and 0.1seconds  each. A washout procedure was performed with a [place] ophthalmics brush and  blood was vacuumed from the retinal surface.   The instruments were removed from the eye and 9-0 nylon was used to close  the sclerotomy sites. They were tested and found to be tight with a Wecksell  sponge test. The conjunctiva was closed with wet field cautery. Polymixin  and gentamicin were irrigated in the tenon space. Atropine solution was  applied. Decadron 10 mg was injected into the lower subconjunctival space.  The closing tensio was 15 with a Behr keratometer.   There were no complications. Duration of the procedure was 1 hour. The  patient was awakened and taken to the recovery  room in satisfactory  condition.                                               Beulah Gandy. Ashley Royalty, M.D.    JDM/MEDQ  D:  11/17/2003  T:  11/18/2003  Job:  [id number]

## 2011-05-03 NOTE — Op Note (Signed)
NAME:  Marcus Hendrix, Marcus Hendrix                       ACCOUNT NO.:  [id number]   MEDICAL RECORD NO.:  [id number]                   PATIENT TYPE:  AMB   LOCATION:  DAY                                  FACILITY:  [facility]   PHYSICIAN:  Dalia Heading, M.D.               DATE OF BIRTH:  02/07/1940   DATE OF PROCEDURE:  09/14/2003  DATE OF DISCHARGE:                                 OPERATIVE REPORT   PREOPERATIVE DIAGNOSIS:  Left inguinal hernia.   POSTOPERATIVE DIAGNOSIS:  Left inguinal hernia, indirect.   PROCEDURE:  Left inguinal herniorrhaphy.   SURGEON:  Dalia Heading, M.D.   ANESTHESIA:  Spinal.   INDICATIONS FOR PROCEDURE:  The patient is a 70 year old black male who  presents with symptomatic left inguinal hernia.  The risks and benefits of  the procedure, including bleeding, infection, and recurrence of the hernia  were fully explained to the patient who gave informed consent.   DESCRIPTION OF PROCEDURE:  The patient was placed in the supine position  after spinal anesthesia was administered.  The left groin region was prepped  and draped using the usual sterile technique with Betadine.  Surgical site  confirmation was performed.   A transverse incision was made in the left groin region down to the external  oblique aponeurosis.  The aponeurosis was incised to the external ring.  The  ilioinguinal nerve was identified and retracted inferiorly from the  operative field.  A Penrose drain was placed around the spermatic cord.  An  indirect hernia sac was found.  This was freed away from the spermatic cord  and inverted.  A medium-size Marlex mesh plug was then placed in this  region.  Next, an onlay mesh patch was then placed along the floor of the  inguinal canal and secured superiorly to the conjoined tendon and inferiorly  to the shelving edge of Poupart's ligament using a 2-0 Novofil interrupted  suture.  The internal ring was recreated using a 2-0 Novofil suture.  The  external oblique aponeurosis was reapproximated using a 2-0 Vicryl running  suture.  The subcutaneous layer was reapproximated using a 3-0 Vicryl  interrupted suture.  The skin was closed using a 4-0 Vicryl subcuticular  suture.  Sensorcaine 0.5% was instilled into the surrounding wound, and the  wound was covered with collodion.   All tape and needle counts were correct at the end of the procedure.  The  patient was transferred to the PACU in stable condition.   COMPLICATIONS:  None.    SPECIMENS:  None.   ESTIMATED BLOOD LOSS:  Minimal.      ___________________________________________                                            Dalia Heading, M.D.   MAJ/MEDQ  D:  09/14/2003  T:  09/14/2003  Job:  [id number]   cc:   Ramon Dredge L. Juanetta Gosling, M.D.  [address]  [place]  [place] [address]  Fax: [phone]

## 2011-05-03 NOTE — Op Note (Signed)
[place]. [facility]  Patient:    Marcus Hendrix, Marcus Hendrix                    MRN: [id number] Proc. Date: 06/13/01 Adm. Date:  [id number] Attending:  Cleatrice Burke                           Operative Report  PREOPERATIVE DIAGNOSIS: Sternal wound infection, status post sternal debridement and muscle flap closure.  POSTOPERATIVE DIAGNOSIS: Sternal wound infection, status post sternal debridement and muscle flap closure.  OPERATION/PROCEDURE: Sternal debridement.  SURGEON: Teena Irani. Odis Luster, M.D.  ANESTHESIA: General.  ESTIMATED BLOOD LOSS: Minimal.  INDICATIONS FOR PROCEDURE: This 69 year old man had a coronary artery bypass graft procedure and subsequently developed a sternal wound infection that required sternal debridement and underwent muscle flap closure.  He has done well for almost two months but approximately one week ago he developed some redness and swelling at the inferior third of his sternal incision.  This had a small abscess and this was drained by Dr. Donata Clay.  CT scan was negative. He still has a tiny bit of drainage from the area and was felt this is probably tracking in the direction of a residual costal cartilage, and it is felt that this area should be explored and debrided as needed.  This was discussed with him in detail including the possibility of further surgeries, and he understood and wished to proceed.  DESCRIPTION OF PROCEDURE: The patient was taken to the operating room and placed supine.  After successful induction of general anesthesia he was prepped with Betadine and draped with sterile drapes.  A small opening was then lengthened for a couple of cm inferiorly and superiorly.  Dissection was carried down to the muscle flap.  A Q-tip was then passed and the direction of the sinus tract was identified.  Dissection was then continued in that direction, which was tracking over toward the right hand side.  Underlying costal  cartilage was identified.  It appeared to be fairly healthy but since the tract extended down to the costal cartilage it was felt that this area should be removed.  It was removed with a rongeur and a curet, with great care being taken to avoid damage to any underlying pleural space or sternal space. Thorough irrigation with saline.  Good hemostasis was noted.  All the cartilage that was visible within this area was removed.  This was then inspected with Dr. Laneta Simmers, who came to the operating room to take a look as well.  It was felt that this was all that was needed in terms of the debridement at this time.  Good hemostasis had been confirmed and the wound was packed with a saline moistened one inch plain gauze and covered with a dry sterile dressing.  He was transported to the recovery room. DD:  06/13/01 TD:  06/13/01 Job: [id number]

## 2011-05-03 NOTE — H&P (Signed)
[place]. [facility]  Patient:    UCHENNA, RAPPAPORT                    MRN: [id number] Adm. Date:  [id number] Attending:  Cleatrice Burke Dictator:   Loura Pardon, P.A.                         History and Physical  DATE OF BIRTH:  February 07, 1940  MEDICATIONS:  1. Mavik 2 mg daily.  2. Lopressor one half tab p.o. in a.m., one half tab p.o. in p.m.  3. Humulin N 40 units subcu in the morning, and Humulin N 10 units subcu     in the evening.  4. Lipitor 10 mg p.o. q.h.s.  5. Norvasc 5 mg daily.  6. Darvocet-N 100.  7. Colace 100 mg daily.  8. Enteric-coated aspirin 325 mg daily.  9. Digoxin 0.25 mg daily. 10. Lasix 40 mg daily. 11. Potassium chloride 20 mEq daily. DD:  06/05/01 TD:  06/07/01 Job: [id number]

## 2011-05-03 NOTE — Discharge Summary (Signed)
[place]. [facility]  Patient:    Marcus Hendrix, Marcus Hendrix                    MRN: [id number] Adm. Date:  [id number] Disc. Date: 04/20/01 Attending:  Cleatrice Burke Dictator:   Adair Patter, P.A. CC:         Alleen Borne, M.D.  Teena Irani. Odis Luster, M.D.  Arturo Morton. Riley Kill, M.D. [facility]   Discharge Summary  DATE OF BIRTH:  February 07, 1940  ADMITTING DIAGNOSIS:  Sternal wound infection.  SECONDARY DIAGNOSES: 1. Insulin-dependent diabetes mellitus. 2. Pneumonia. 3. Mediastinitis. 4. Pleural effusion.  DISCHARGE DIAGNOSIS:  Sternal wound infection.  WOUND PROCEDURES: 1. Debridement of sternal wound. 2. Second sternal debridement with left pectoralis muscle flap and right    chest fascial cutaneous flap.  HOSPITAL COURSE:  [name] was admitted to [facility] on April 03, 2001 secondary to sternal wound infection.  Because of this Dr. Laneta Simmers took patient to operating room for debridement of sternal wound. Patient was left intubated for several days so that dressing changes could be performed on patients open sternal wound.  Despite aggressive wound care and antibiotics patient had persistent fever.  Infectious disease consult was obtained and antibiotics were switched appropriately.  Aggressive wound was continued.  The patients hospital course was complicated by pneumonia, which again was treated by antibiotics; and, he also had a hospital course complicated by pleural effusion, which was drained with a chest tube.  After the patients infections started to clear he was taken back to the OR for a second sternal debridement and muscle flap as stated in the hospital procedures.   After the second sternal debridement the patient began to progress well postoperatively.  He had several drains in his mediastinal wound, two of which were discontinued before the time of discharge.  The patient continued to make good progress and was discharged home  in stable condition on Apr 20, 2001.  MEDICATIONS AT THE TIME OF DISCHARGE:  1. Tylox 2 tablets every 4-6 hours as needed for pain.  2. Keflex 500 mg 1 tablet every six hours for five days.  3. Timoptic 1 drop each eye daily.  4. Mavik 1 mg daily.  5. Norvasc 5 mg 1 tablet daily.  6. Lasix 40 mg 1 tablet daily.  7. Digoxin 0.25 mg 1 tablet daily.  8. Lopressor 50 mg 1 tablet every 12 hours.  9. Potassium chloride 20 mEq 1 tablet daily. 10. Humulin N insulin 10 units daily.  DISCHARGE ACTIVITY:  Patient told to avoid driving, strenuous activity and no lifting objects over 10 pounds.  He was also told not to raise his arms above his head.  DISCHARGE DIET:  Eighteen-calorie ADA diet.  WOUND CARE:  Patient was told to keep his incision dry and not to shower until approved by Dr. Odis Luster.  DISPOSITION:  Home.  SPECIAL INSTRUCTIONS:  Patient was told to use his incentive spirometer five times daily while at home.  He was told to empty the remaining drains in his sternal incision two to three times a day and record the amounts.  He will have visiting nurse once a day for drain care.  FOLLOW-UP:  Patient told to follow up with Dr. Laneta Simmers in two weeks.  He awas told to go to the [facility] one hour before his appointment with Dr. Laneta Simmers for chest x-ray.  He was also told to call Dr. Odis Luster office for an  appointment on Thursday.  He was given Dr. Odis Luster office number. DD:  04/19/01 TD:  04/20/01 Job: [id number]

## 2011-05-03 NOTE — Cardiovascular Report (Signed)
[place]. [facility]  Patient:    Marcus Hendrix, Marcus Hendrix                    MRN: [id number] Proc. Date: 03/11/01 Adm. Date:  [id number] Attending:  Ronaldo Miyamoto CC:         Kari Baars, M.D.  Thomas C. Wall, M.D. [facility]  CV Laboratory   Cardiac Catheterization  INDICATIONS:  Mr. Strojny is an elderly-appearing, 70 year old male, who has a recent history of unstable angina.  He was seen by Dr. Juanetta Gosling and subsequently referred for further diagnostic catheterization.  PROCEDURES: 1. Left heart catheterization. 2. Selective coronary arteriography. 3. Selective left ventriculography. 4. Subclavian angiography.  DESCRIPTION OF PROCEDURE:  The procedure was performed from the right femoral artery using 6 French catheters.  He tolerated the procedure without complication.  He was taken to the holding area in satisfactory clinical condition.  HEMODYNAMICS:  The central aortic pressure was 177/91. LV pressure 164/12. There was about a 5 mm gradient on pullback across the aortic valve.  ANGIOGRAPHIC DATA:  On plain fluoroscopy there was extensive calcification of the aortic root.  There was also subclavian calcification and coronary calcification.  The left main coronary artery was short and free of critical disease, although heavily calcified.  The LAD demonstrates an approximate 80% proximal stenosis.  This is best seen in terms of grading on an LAO caudle view.  In the mid vessel there is 80 and 90% narrowing over a segmental area.  The first diagonal has a 50% proximal and 80% mid stenosis and the second diagonal has about an 80% stenosis.  The distal vessel in the LAD is suitable for grafting.  The circumflex has about 80% narrowing in the proximal segmental area, followed by the origin of a large marginal branch that has at least three sub-branches.  There is diffuse disease of 80 and 70% across the first two sub-branches and subtotal  occlusion leading into the distal branches.  The right coronary artery is subtotally occluded with slow flow distally and fills by retrograde collaterals.  The subclavian is diseased as well.  There does not appear to be flow-limiting obstruction but there appears to be a dissection of the main lumen.  There is also some suggestion of narrowing of the mammary at the ostium of about 50%.  VENTRICULOGRAPHY:  Ventriculography done in the RAO projection reveals hypokinesis of the inferobasal segment.  Overall LV function is reduced and will be estimated at 45%.  CONCLUSIONS: 1. Moderate reduction in global left ventricular function with an inferobasal    wall motion abnormality. 2. Severe three-vessel coronary artery disease. 3. Moderate disease of the subclavian.  DISPOSITION:  Surgical consult will be obtained. DD:  03/11/01 TD:  03/12/01 Job: [id number]

## 2011-05-03 NOTE — H&P (Signed)
NAME:  Marcus Hendrix, KOTLYAR             ACCOUNT NO.:  [id number]   MEDICAL RECORD NO.:  [id number]          PATIENT TYPE:  AMB   LOCATION:  DAY                           FACILITY:  [facility]   PHYSICIAN:  Leroy C. Katrinka Blazing, M.D.   DATE OF BIRTH:  02/07/1940   DATE OF ADMISSION:  DATE OF DISCHARGE:  LH                                HISTORY & PHYSICAL   HISTORY OF PRESENT ILLNESS:  This is a 70 year old male referred by Dr.  Pryor Montes for evaluation for colonoscopy.  The patient has a history of  constipation.  He denies abdominal pain.  He has no nausea and vomiting, no  known family history of colon cancer.  He has had occasional blood with  bowel movements.  He will have a screening colonoscopy.   PAST MEDICAL HISTORY:  He has hypertension, diabetes mellitus, peripheral  vascular disease, and chronic anemia.   PAST SURGICAL HISTORY:  Six vessel coronary artery bypass graft 4 years ago.  Laparoscopic cholecystectomy.   MEDICATIONS:  1.  Klor-Con 20 one daily.  2.  Furosemide 40 mg daily.  3.  Cilostazol 100 mg twice daily.  4.  Metoprolol 50 mg twice daily.  5.  Metformin 500 mg twice daily.  6.  Norvasc 10 mg daily.  7.  Lipitor 40 mg daily.  8.  Aspirin 81 mg daily.   IMPRESSION:  1.  Guaiac positive stool with history of rectal bleeding.  2.  Diabetes mellitus.  3.  Hypertension.   PLAN:  The patient will have a screening colonoscopy.      Dirk Dress. Katrinka Blazing, M.D.  Electronically Signed     LCS/MEDQ  D:  06/11/2006  T:  06/12/2006  Job:  [id number]

## 2011-05-03 NOTE — Op Note (Signed)
[place]. [facility]  Patient:    Marcus Hendrix, Marcus Hendrix                    MRN: [id number] Proc. Date: 04/15/01 Adm. Date:  [id number] Attending:  Cleatrice Burke                           Operative Report  PREOPERATIVE DIAGNOSIS:  Sternal dehiscence, status post sternal infection.  POSTOPERATIVE DIAGNOSIS:  Sternal dehiscence, status post sternal infection.  OPERATION:  1. Sternal debridement. 2. Left pectoralis muscle flap. 3. Right chest fasciocutaneous flap.  SURGEON:  Teena Irani. Odis Luster, M.D.  ASSISTANT:  Alfredia Ferguson, M.D.  SECOND ASSISTANT:  Alleen Borne, M.D.  ANESTHESIA:  General  ESTIMATED BLOOD LOSS:  750 cc  DRAINS:  Four Blakes left.  CLINICAL NOTE:  This 70 year old, man underwent CABG a little over a month ago.  He subsequently developed a drain from his sternum and he was noted to have a sternal wound infection.  This was opened up and cleaned out by Dr. Laneta Simmers over a week ago.  He did develop a pneumonia before he could have a definitive debridement and muscle flap coverage.  Now that he was recovered from his pneumonia, he was cleared for surgery and the nature of the procedure and the risks and possible complications were discussed with him in great detail.  He understood the nature of the procedure and wished to proceed.  DESCRIPTION OF PROCEDURE:  The patient was taken to the operating room and placed supine.  After successful induction of general anesthesia, he was prepped with Betadine and draped with sterile drapes.  The debridement was performed debriding the bone back to good bleeding all around the periphery with great care being taken to avoid damage to the heart.  The wound edges were then excised including skin and subcutaneous tissues so that there was entirely fresh wound edge from skin edge down to bone.  The base of the wound was curetted.  The wound was irrigated with the pulse irrigation, a total of 3 liters  of saline.  Meticulous hemostasis was achieved using the electrocautery and the wound was packed with moist saline laps.  Left chest skin was then elevated off the underlying pectoralis muscle.  This was continued down laterally to the axilla.  The pectoralis muscle was then mobilized in the deltoid pectoral groove and the pectoralis muscle divided from its insertion using electrocautery under direct vision.  The dissection was then carried deep to the pectoralis muscle and it was elevated up off of the ribs.  The muscle was freed from the clavicular head.  The thoracoacromial vessels were identified under direct vision so that they would be preserved. The pectoralis muscle was then dissected free from its origin distally and raising the pectoralis muscle the thoracoacromial vessels were kept under direct vision.  Minor accessory vessels to the lateral portal of the pectoralis muscle were divided between Ligaclips.  The muscle was found to reach very nicely to the wound having been thus mobilized.  The muscle appeared to fill the wound very nicely by itself from superior aspect to the inferior aspect without the need for the right pectoralis turn over flap.  In order to close the skin a right fasciocutaneous chest flap was elevated. This facilitated the closure of the wound over the muscle flap.  Thorough irrigation with saline and meticulous hemostasis with electrocautery.  The  pectoralis muscle was then in set using 3-0 Vicryl simple interrupted sutures and interrupted horizontal mattress sutures, imbricating the muscle end down deep into the wound.  This obliterated the space.  This was performed after a Blake drain had been positioned in the depth of the wound and brought out through a separate stab wound inferiorly.  Once the muscle in set had been completed, the muscle was noted to have excellent color and was certainly viable.  The thorough irrigation again with saline and  meticulous hemostasis again with electrocautery.  Drains were positioned, three more drains.  One in right chest, one in left chest and one on top of the muscle flap and brought out through separate stab wounds inferiorly and secured with 3-0 Prolene sutures.  The closure of the skin edges with 2-0 Vicryl interrupted inverted deep sutures and skin staples.  He was transported back to the intensive care unit in stable condition, having tolerated the procedure well. DD:  04/15/01 TD:  04/16/01 Job: [id number]

## 2011-05-03 NOTE — Letter (Signed)
July 31, 2006     Dr. Genevie Ann  [address]  [place], [place]  [address]   RE:  HELMUTH, RECUPERO  MRN:  [id number]  /  DOB:  02/07/1940   Dear Remi Haggard:   Mr. Goldammer returns to the office for continued assessment and treatment of  cardiovascular disease, cerebrovascular disease and multiple vascular risk  factors.  Since I last saw him two months ago, he has felt quite well.  He  brings in medications which are different from the medications he was taking  when last I evaluated him.  He does not have Zetia with him, but claims to  be taking it.  He suggested you changed his lisinopril/HCTZ to lisinopril  alone.   EXAM:  A pleasant gentleman in no acute distress.  The weight is 172, 6  pounds more than in April.  Blood pressure 155/70, heart rate 85 and  regular, respirations 16.  NECK:  Right carotid bruit.  LUNGS:  Clear.  CARDIAC:  Normal first and second heart sounds; prominent fourth heart  sound; grade 3/6 holosystolic murmur at the left sternal border and apex.  ABDOMEN:  Soft and nontender; no masses.  EXTREMITIES:  No edema.   Recent laboratory is good with recovery of his hemoglobin to 12.1, continued  normal electrolytes and renal function, and markedly decreased lipids with a  total cholesterol now less than 100 and an LDL of 35.   IMPRESSION:  Mr. Begley is doing generally well.  Blood pressure control  remains suboptimal.  We will attempt to clarify his medications with your  office.  If I cannot do so, I  believe modification of his antihypertensive regimen to your discretion.  I  will reassess this nice gentleman again in nine months.  Vaccinations are up  to date.  He is reminded to receive influenza vaccine in your office when  available.    Sincerely,      Gerrit Friends. Dietrich Pates, MD, [facility]   RMR/MedQ  DD:  07/31/2006  DT:  07/31/2006  Job #:  [id number]

## 2011-05-03 NOTE — Letter (Signed)
July 31, 2006     Genevie Ann, M.D.  [address]  [place], [place]  [address]   RE:  DOMANIK, RAINVILLE  MRN:  [id number]  /  DOB:  02/07/1940   Dear Remi Haggard:   It was kind of your office to provide me with your recent notes.  I see that  Mr. Aldea has required ever increasing amounts of antihypertensive  medication but still does not have optimal control.  I have suggested to him  that he start transdermal clonidine at level 2.  He will collect additional  blood pressure values and return to see my nurse in one month for  reassessment of control.  I will plan to see him again in nine months as  previously scheduled.  Thanks so much for allowing me to share in the care  of this very nice gentleman.    Sincerely,      Gerrit Friends. Dietrich Pates, MD, [facility]   RMR/MedQ  DD:  07/31/2006  DT:  07/31/2006  Job #:  [id number]

## 2011-05-03 NOTE — H&P (Signed)
[place]. [facility]  Patient:    ISABELLA, IDA                    MRN: [id number] Adm. Date:  [id number] Attending:  Cleatrice Burke Dictator:   Durenda Age, P.A.-C. CC:         Arturo Morton. Riley Kill, M.D. [facility]   History and Physical  DATE OF BIRTH:  February 07, 1940  CHIEF COMPLAINT:  Sternal wound infection.  HISTORY OF PRESENT ILLNESS:  Mr. Thede is a pleasant 70 year old black male, with a recent admission to the hospital secondary to CABG x 6 on March 16, 2001, by Dr. Laneta Simmers, for severe three-vessel CAD, unstable angina and left ventricular dysfunction with an ejection fraction of 29%.  Upon discharge, the patient was having a stable recovery.  However, overnight, the patient woke up with fever and chills, nausea and diaphoresis.  Because of the patient was scheduled for staple removal on April 02, 2001, he presented to [facility], where he was evaluated by Dr. Donata Clay.  The left lower extremity was healing accordingly.  However, the sternal wound incision, showed an area of drainage, with surrounding erythema, in the lower portion of the sternal incision.  This area appeared to be infected, and because of the patients symptoms, and the signs of infection, complaining of sternal tenderness, it was recommended that the patient be admitted to [facility] for IV vancomycin treatment, dressing changes, and a CT of the chest with contrast, in order to rule out infection.  Dr. Laneta Simmers has been informed of the patients admission, and his condition will be followed up.  PAST MEDICAL HISTORY:  1. CAD, status post CABG.  2. Previous history of unstable angina, resolved.  3. Decreased left ventricular function.  4. Hypercholesterolemia.  5. Hypertension.  6. Strong family history of cardiac disease.  7. PVOD - claudication.  8. History of tobacco abuse.  9. History of right renal disease. 10. Previous history of ______ disease. 11.  IDDM.  PAST SURGICAL HISTORY:  1. Status post CABG x 6 with LIMA to the LAD, SVG to the diagonal #2 of the     left anterior descending, sequential SVG to the first, second and third     marginal branches of the left circumflex coronary artery, and a     saphenous vein graft to the posterior descending branch of the     right coronary artery.  Dr. Laneta Simmers on March 16, 2001.  2. Status post cholecystectomy, remote.  3. Status post cardiac catheterization on March 11, 2001, Dr. Riley Kill.  4. Status post cardiac catheterization x 3 with two PTCAs by Dr. Katrinka Blazing;     no records.  MEDICATIONS:  1. Digoxin 0.25 mg p.o. q.d.  2. Lopressor 25 mg p.o. b.i.d.  3. K-Dur 20 mEq p.o. q.d.  4. Lasix 40 mg p.o. q.d.  5. Humulin N insulin 2 units a.m., 5 units p.m.  6. Mavik 1 mg p.o. q.d.  7. Norvasc 5 mg p.o. q.d.  8. Aspirin 325 mg p.o. q.d.  ALLERGIES:  NKDA.  REVIEW OF SYSTEMS:  See HPI and past medical history for significant positives.  FAMILY HISTORY:  Mother deceased, 69 of MI.  Father deceased of unknown causes.  SOCIAL HISTORY:  Married, retired.  He had a 25-pack-year of tobacco.  He now chews tobacco for the past 3-4 years.  He denies any alcohol intake.  PHYSICAL EXAMINATION:  GENERAL:  A thin, 70 year old, black  male in significant discomfort secondary to the fever, with chills and nausea, alert and oriented x 3.  VITAL SIGNS:  Blood pressure 170/68, pulse 96, temperature 100, respirations 20.  NECK:  Supple.  No JVD.  ______ bruits audible.  No thyromegaly or lymphadenopathy.  CHEST:  Symmetrical on inspirations with no wheezes, rhonchi, or rales.  No axillary lymphadenopathy.  No supraclavicular lymphadenopathy.  The respirations are morbidly labored secondary to the discomfort.  He has an area, as mentioned in HPI, of purulent pocket, with drainage, at the bottom of the chest incision.  CARDIOVASCULAR:  Regular rate and rhythm, 1/6 systolic murmur with no rubs  or gallops.  ABDOMEN:  Soft, nontender.  Bowel sounds x 4.  No hepatosplenomegaly, masses palpable or abdominal bruits.  GU/RECTAL:  Deferred.  EXTREMITIES:  No clubbing or cyanosis.  Trace of edema in the left lower extremity with a well-healed venectomy site.  SKIN:  Shows warm temperature.  PULSES:  Peripheral pulses, carotids 2+ bilaterally.  Femoral 2+ bilaterally. Popliteal 1+ bilaterally.  Dorsalis pedis and posterior tibialis nonpalpable.  NEUROLOGICAL:  Grossly normal.  The patient is at bed rest.  DTRs not tested. Muscle strength debilitated at this time.  ASSESSMENT AND PLAN:  A 70 year old, black male with a history of coronary artery disease, status post coronary artery bypass graft with a possible sternal wound infection, admitted for intravenous vancomycin treatment, computed tomographic scan of the chest with intravenous contrast and dressing changes.  Dr. Donata Clay, for Dr. Laneta Simmers, has seen and evaluated this patient prior to admission.  He has explained the risks and benefits involving the procedure and the patient has agreed to continue. DD:  04/03/01 TD:  04/03/01 Job: [id number]

## 2011-05-03 NOTE — Op Note (Signed)
[place]. [facility]  Patient:    Marcus Hendrix, Marcus Hendrix                    MRN: [id number] Proc. Date: 03/16/01 Adm. Date:  [id number] Attending:  Cleatrice Burke CC:         Jesse Sans. Wall, M.D. [facility]  Cath lab   Operative Report  PREOPERATIVE DIAGNOSIS:  Severe three-vessel coronary artery disease with mild left ventricular dysfunction and unstable angina.  POSTOPERATIVE DIAGNOSIS:  Severe three-vessel coronary artery disease with mild left ventricular dysfunction and unstable angina.  OPERATION PERFORMED:  Median sternotomy, extracorporeal circulation, coronary artery bypass graft surgery x 6 using left internal mammary artery graft to the left anterior descending coronary artery, with a saphenous vein graft to the second diagonal branch of the left anterior descending, a sequential saphenous vein graft to the first, second and third obtuse marginal branches of the left circumflex coronary artery and a saphenous vein graft to the posterior descending branch of the right coronary artery.  SURGEON:  Alleen Borne, M.D.  ASSISTANT:  Lissa Hoard, P.A.  ANESTHESIA:  General endotracheal.  INDICATIONS FOR PROCEDURE:  The patient is a 70 year old gentleman with a history of coronary disease and is status post angioplasty in the past who has not had cardiology follow-up in seven to eight years.  He now presents with unstable anginal symptoms.  Cardiac catheterization by Dr. Riley Kill on March 11, 2001 showed severe three-vessel disease with mild left ventricular dysfunction.  The left main was calcified.  There was about 80% proximal LAD stenosis and 80 and 90% mid-LAD stenoses between the first and second diagonal branches.  The first diagonal branch had a 50% proximal and 80% mid disease. The second diagonal branch had 80% proximal stenosis.  The left circumflex had long 80% proximal stenosis.  There is a large branching marginal system. There was about  80% stenosis of the first marginal branch and then 70% stenosis between the first and second marginals.  After the second marginal branch, the left circumflex was subtotally occluded with filling of the third marginal.  The right coronary artery had 95% midvessel stenosis with faint filling of the distal vessel.  The posterior descending also filled by left to right collaterals.  There was no significant gradient across the aortic valve. Also noted was about 50% stenosis of the left subclavian artery just before the take-off of the left internal mammary artery branch.  There also appeared to be some dissection in the wall of the left subclavian artery in this area. The ostium of the left internal mammary artery graft had about 50% stenosis. After review of these angiograms and examination of the patient it was felt that the best treatment was to proceed with coronary artery bypass grafting surgery.  I felt that this would require use of a free left internal mammary artery graft.  Lower extremity Doppler exam was performed and showed evidence of significant iliac and superficial femoral artery disease.  He had monophasic wave forms bilaterally with an ABI of 0.63 on the right and  0.68 on the left.  His carotid Doppler showed a 40 to 60%  right internal carotid artery stenosis.  I discussed the operative procedure with the patient including alternatives to surgery, benefits, and risks including bleeding, possible blood transfusion, infection, stroke, myocardial infarction, and death.  He understood and agreed to proceed with surgery.  DESCRIPTION OF PROCEDURE:  The patient was taken to the operating room  and placed on the table in supine position.  After induction of general endotracheal anesthesia, a Foley catheter was placed in the bladder using sterile technique.  Then the chest, abdomen and both lower extremities were prepped and draped in the usual sterile manner.  The chest was entered  through a median sternotomy incision and the pericardium opened in the midline. Examination of the heart showed good ventricular contractility.  The ascending aorta had no palpable plaques in it.  Then the left internal mammary artery was harvested from the chest wall as a pedicle graft.  This was a medium caliber vessel with excellent blood flow through it.  At the same time a segment of greater saphenous vein was harvested from the left leg and this vein was of large caliber but good quality.  Then the patient was heparinized and when an adequate activated clotting time was achieved, the distal ascending aorta was cannulated using a 20 French aortic cannula for arterial inflow.  Venous outflow was achieved using a two-stage venous cannula through the right atrial appendage.  An antegrade cardioplegia and vent cannula was inserted into the aortic root.  The patient was placed on cardiopulmonary bypass and the distal coronary arteries were identified.  The patient had severe diffuse disease.  The LAD was graftable.  The first diagonal branch was a small diffusely diseased, nongraftable vessel.  The second diagonal branch was a medium sized graftable vessel that was diffusely diseased.  The left circumflex gave off a medium sized first marginal that was graftable.  The second marginal was intramyocardial and was smaller but suitable for grafting.  The third marginal was a medium sized graftable vessel.  There was a small fourth marginal that was not graftable.  There was evidence of previous lateral infarction with patchy scar present.  The right coronary artery gave off a small to medium-sized posterior descending coronary artery that was diffusely diseased and there were few tiny posterolateral branches.  Then the aorta was cross-clamped and 500 cc of cold blood antegrade cardioplegia was administered in the aortic root with quick arrest of the heart.  Systemic hypothermia to 20  degrees centigrade and topical hypothermia  with iced saline was used.  A temperature probe was placed in the septum and an insulating pad in the pericardium.  The first distal anastomosis was performed to the first marginal branch.  The internal diameter was 1.5 mm.  The conduit used was a segment of greater saphenous vein.  Anastomosis was performed in side-to-side manner using continuous 7-0 Prolene suture.  The flow was measured through the graft and was excellent.  The second distal anastomosis was performed to second marginal branch.  The internal diameter was 1.5 mm.  The conduit used was the same segment of greater saphenous vein.  The anastomosis was performed in a continuous side-to-side manner using continuous 7-0 Prolene suture.  The flow was measured through the graft and was excellent.  The third distal anastomosis was performed to the third marginal branch.  The internal diameter in this area was 1.6 mm.  The conduit used was the same segment of greater saphenous vein.  Anastomosis was performed in a sequential end-to-side manner using continuous 6-0 Prolene suture.  The flow was measured through the graft and was excellent.  Then another dose of cardioplegia was given down the vein grafts and into the aortic root.  The fourth distal anastomosis was performed to the second diagonal branch. The internal diameter was 1.6 mm.  The conduit  used was a second segment of greater saphenous vein.  The anastomosis was performed in an end-to-side manner using continuous 7-0 Prolene suture.  The flow was measured through the graft and was excellent.  The fifth distal anastomosis was performed to the posterior descending coronary artery.  The internal diameter at its midportion was about 1.5 mm. The conduit used was a third segment of greater saphenous vein.  The anastomosis was performed in an end-to-side manner using continuous 7-0 Prolene suture.  The flow was measured through  the graft and was excellent.  Then the sixth distal anastomosis was performed to the midportion of the left anterior descending coronary artery.  The internal diameter was about 2 mm. The conduit used was the left internal mammary artery graft and this was used as a free graft.  It was detached from the subclavian artery proximally and the stump was suture ligated with 2-0 silk.  Then the distal end of the mammary artery was anastomosed to the LAD in an end-to-side manner using continuous 8-0 Prolene suture.  The flow was measured through the graft and the anastomosis appeared hemostatic.  The pedicle was tacked to the epicardium with 6-0 Prolene sutures.  Then the patient was rewarmed to 37 degrees and the clamp removed from the aorta with a time of 83 minutes.  There was spontaneous return of sinus rhythm.  Then a partial occlusion clamp was placed on the aortic root and the three proximal vein graft anastomoses were performed in end-to-side manner using continuous 6-0 Prolene suture.  The proximal anastomosis of the internal mammary artery graft was performed to the hood of the diagonal vein graft in end-to-side manner using continuous 7-0 Prolene suture.  The clamps were removed, the vein grafts de-aired and the clamps removed from them.  The proximal and distal anastomoses appeared hemostatic and the line of the grafts satisfactory.  Graft markers were placed around the proximal anastomoses.  Two temporary right ventricular and right atrial pacing wires were placed and brought out through the skin.  When the patient had rewarmed to 37 degrees centigrade, he was weaned from cardiopulmonary bypass on no inotropic agents.  Total bypass time was 137 minutes.  Cardiac function appeared excellent with a cardiac output of 5L per minute.  Protamine was given and the venous and aortic cannulas were removed without difficulty.  Hemostasis was achieved.  Three chest tubes were placed with a  tube in the posterior pericardium and one in the left pleural space and one in the anterior mediastinum.  The pericardium was reapproximated over the heart.  The sternum was closed with #6 stainless steel wires.  The fascia was closed with continuous #1 Vicryl suture.  The subcutaneous tissues were closed using continuous 2-0 Vicryl and the skin with 3-0 Vicryl subcuticular closure. The lower extremity vein harvest site was closed in layers in a similar manner.  The sponge, needle and instrument counts were correct according to the scrub nurse.  A dry sterile dressing was applied over the incisions and around the chest tubes which were hooked to Pleur-Evac suction.  The patient remained hemodynamically stable and was transported to the SICU in guarded but stable condition. DD:  03/16/01 TD:  03/16/01 Job: [id number]

## 2011-05-03 NOTE — Discharge Summary (Signed)
[place]. [facility]  Patient:    Marcus Hendrix, Marcus Hendrix                    MRN: [id number] Adm. Date:  [id number] Disc. Date: [id number] Attending:  Cleatrice Burke Dictator:   Adair Patter, P.A. CC:         Jesse Sans. Wall, M.D. [facility]   Discharge Summary  DATE OF BIRTH:  February 07, 1940  ADMISSION DIAGNOSIS:  Coronary artery disease.  SECONDARY DIAGNOSIS: 1. Hypertension. 2. Hyperlipidemia. 3. History of tobacco use. 4. Postoperative atrial fibrillation.  DISCHARGE DIAGNOSIS:  Coronary artery disease.  PROCEDURES: 1. Cardiac catheterization. 2. Bilateral carotid duplex. 3. Bilateral upper and lower extremity Dopplers. 4. Coronary bypass graft x 6.  HOSPITAL COURSE:  Mr. Trahan was admitted to [facility] on March 11, 2001, at which time he underwent elective cardiac catheterization. Results of his catheterization revealed the patient had significant coronary artery disease.  Because of this Dr. Evelene Croon was consulted for possible surgical intervention.  On March 16, 2001, the patient underwent a coronary bypass graft x 6 with a free left internal mammary artery anastomosed to left anterior descending artery, a saphenous vein graft to the posterior descending artery, saphenous vein graft to the second diagonal artery, and a sequential saphenous vein graft to obtuse marginals 1, 2, and 3.  The procedure was done by Dr. Laneta Simmers under general endotracheal anesthesia and cardiopulmonary bypass.  No complication was noted during the procedure.  The patient had his postoperative course complicated by new onset atrial fibrillation.  This was controlled with digoxin and Lopressor.  The patient eventually converted back to normal sinus rhythm without any further intervention.  The patient also had a postoperative course complicated by fluid overload.  This was treated with aggressive loop diuretics.  The remainder of the patients  postoperative course remained uneventful.  He was subsequently discharged home in stable condition on postoperative day #7.  MEDICATIONS AT TIME OF DISCHARGE:  1. Tylox one to two tablets q.4-6h. p.r.n. pain.  2. Digoxin 0.25 mg q.d.  3. Aspirin 325 mg one tablet q.d.  4. Lopressor 50 mg one-half tablet q.12h.  5. Norvasc 5 mg q.d.  6. Humulin N insulin 20 units in the a.m., 5 units in the p.m.  7. Timolol 0.5% eye drops one drop each eye q.d.  8. Mavik 1 mg one tablet q.d.  9. Lasix 40 mg one tablet q.d. 10. Potassium chloride 20 mEq one tablet q.d. 11. Home oxygen to be used as directed.  DISCHARGE INSTRUCTIONS:  Activity:  The patient is told to avoid driving, strenuous activity, and lifting objects over 10 pounds.  Diet:  An 1800-calorie ADA diet.  Wound care:  The patient is told he could clean his incisions with soap and water and that he may shower.  DISPOSITION:  Home.  DISCHARGE FOLLOWUP:  The patient is told to follow with his cardiologist, Dr. Daleen Squibb, on April 26 at 11:45 a.m.  He is told he will have a chest x-ray taken, and he should bring this to Dr. Garen Grams office with him.  He is also told to follow up with Dr. Laneta Simmers at the [facility] office on Tuesday, April 30 at 73 ocloCK He was also told to report to the [facility] on Friday, April 19 at 10 oclock for staple removal. DD:  04/22/01 TD:  04/23/01 Job: [id number]

## 2011-05-03 NOTE — Discharge Summary (Signed)
[place]. [facility]  Patient:    Marcus, Hendrix                    MRN: [id number] Adm. Date:  [id number] Disc. Date: 06/14/01 Attending:  Cleatrice Burke Dictator:   Carlye Grippe. CC:         Arturo Morton. Riley Kill, M.D. [facility]   Discharge Summary  HISTORY OF PRESENT ILLNESS:  This is a 70 year old male well-known to CVTS having undergone previous coronary artery bypass grafting in April 2002, by Dr. Laneta Simmers with subsequent readmission for sternal wound infection and muscle flap closure by Dr. Odis Luster.  He was seen in the [facility]TS office on the patients own initiative on June 05, 2001, with a two-day history of swelling and pain in the lower portion of the sternal wound.  He had the wound locally explored by Dr. Donata Clay and there was appreciated a small abscess at the left fifth rib level to felt possibly to involve the costal cartilage.  The wound was incised, drained and packed and the patient was admitted for intravenous antibiotics.  A CT scan was obtained on June 05, 2001, with the impression of marked improvement in the fluid and swelling associated with the anterior sternum.  There appears to be a small amount of air associated with the anterior inferior aspect of the soft tissues anterior to the sternum.  There is also marked improvement in bilateral effusions in the lung atelectasis with some atelectasis remaining on the right.  The patient was initially placed on intravenous vancomycin and ciprofloxacin.  Subsequently, this was changed to oral Cipro and then IV Ancef.  Cultures revealed Staphylococcus aureus with multiple drug sensitivities.  HOSPITAL COURSE:  On June 13, 2001, the patient was taken to the operating room by Dr. Odis Luster where he underwent sternal debridement.  The patient tolerated this procedure well.  He is felt to be stable for discharge in the morning of June 14, 2001, pending morning round reevaluation of the wound by the  plastic surgeon and home dressing changes will be arranged.  DISCHARGE MEDICATIONS: 1. It is noted that the patients insulin dosage is somewhat different than    preoperatively with Humulin N 30 units in the morning and 10 units in the    p.m.  This may require further adjustments as the patient feels better and    is tolerating more of his usual diet. 2. Darvocet-N 100 one or two every four to six hours as needed for pain.  SPECIAL INSTRUCTIONS:  The patient will receive written instructions in regard to medications, activity, diet, wound care and followup.  FOLLOWUP:  Follow up with Dr. Odis Luster as he requires.  Additionally, Dr. Laneta Simmers in two weeks and the office will call with this appointment.  CONDITION ON DISCHARGE:  Stable and improved.  DISCHARGE DIAGNOSES:  1. Sternal wound infection, recurrent with a history of previous bilateral     pectoralis muscle flaps after coronary artery bypass graft in April 2002.  2. Coronary artery disease.  3. Decreased left ventricular function.  4. Hypercholesterolemia.  5. Hypertension.  6. Strong family history of cardiac disease.  7. Peripheral vascular occlusive disease with claudication.  8. History of tobacco abuse.  9. History of renal artery stenosis. 10. History of insulin-dependent diabetes mellitus 11. Coronary artery bypass graft with subsequent debridement and muscle flaps. 12. History of remote cholecystectomy.  ALLERGIES:  No known drug allergies. DD:  06/13/01 TD:  06/13/01  Job: [id number]

## 2011-09-09 LAB — DIFFERENTIAL
Basophils Absolute: 0
Basophils Relative: 0
Eosinophils Absolute: 0.3
Eosinophils Relative: 4
Lymphocytes Relative: 17
Lymphs Abs: 1.3
Monocytes Absolute: 0.4
Monocytes Relative: 6
Neutro Abs: 5.4
Neutrophils Relative %: 73

## 2011-09-09 LAB — CBC
HCT: 38.8 — ABNORMAL LOW
Hemoglobin: 13.1
MCHC: 33.8
MCV: 91.9
Platelets: 170
RBC: 4.22
RDW: 13.4
WBC: 7.4

## 2011-09-09 LAB — CK TOTAL AND CKMB (NOT AT ARMC)
CK, MB: 2
Relative Index: 2
Total CK: 102

## 2011-09-09 LAB — POCT CARDIAC MARKERS
CKMB, poc: 1.5
CKMB, poc: 1.8
Myoglobin, poc: 55.6
Myoglobin, poc: 73.9
Operator id: 213671
Operator id: 213671
Troponin i, poc: 0.06 — ABNORMAL HIGH
Troponin i, poc: <0.05

## 2011-09-09 LAB — BASIC METABOLIC PANEL
CO2: 30
Calcium: 9
GFR calc Af Amer: >60
GFR calc non Af Amer: >60
Potassium: 4.4

## 2011-09-09 LAB — BASIC METABOLIC PANEL WITH GFR
BUN: 13
Chloride: 102
Creatinine, Ser: 0.99
Glucose, Bld: 69 — ABNORMAL LOW
Sodium: 138

## 2011-09-09 LAB — TROPONIN I: Troponin I: 0.03

## 2011-10-25 ENCOUNTER — Ambulatory Visit (HOSPITAL_COMMUNITY)
Admission: RE | Admit: 2011-10-25 | Discharge: 2011-10-25 | Disposition: A | Discharge status: Home / Self Care | Payer: Medicare Other | Source: Ambulatory Visit | Attending: Family Medicine | Admitting: Family Medicine

## 2011-10-25 DIAGNOSIS — I059 Rheumatic mitral valve disease, unspecified: Secondary | ICD-10-CM

## 2011-10-25 DIAGNOSIS — R011 Cardiac murmur, unspecified: Secondary | ICD-10-CM | POA: Insufficient documentation

## 2011-10-25 DIAGNOSIS — I10 Essential (primary) hypertension: Secondary | ICD-10-CM | POA: Insufficient documentation

## 2011-10-25 DIAGNOSIS — E119 Type 2 diabetes mellitus without complications: Secondary | ICD-10-CM | POA: Insufficient documentation

## 2011-10-25 DIAGNOSIS — E785 Hyperlipidemia, unspecified: Secondary | ICD-10-CM | POA: Insufficient documentation

## 2011-10-25 NOTE — Progress Notes (Signed)
*  PRELIMINARY RESULTS* Echocardiogram 2D Echocardiogram has been performed.  Marcus Hendrix 10/25/2011, 8:14 AM

## 2011-12-05 ENCOUNTER — Encounter: Payer: Self-pay | Admitting: Cardiology

## 2012-02-26 ENCOUNTER — Encounter: Payer: Self-pay | Admitting: Physician Assistant

## 2012-02-26 ENCOUNTER — Ambulatory Visit (INDEPENDENT_AMBULATORY_CARE_PROVIDER_SITE_OTHER): Payer: Medicare Other | Admitting: Physician Assistant

## 2012-02-26 VITALS — BP 145/75 | HR 60 | Resp 16 | Ht 66.0 in | Wt 202.0 lb

## 2012-02-26 DIAGNOSIS — E785 Hyperlipidemia, unspecified: Secondary | ICD-10-CM

## 2012-02-26 DIAGNOSIS — R0989 Other specified symptoms and signs involving the circulatory and respiratory systems: Secondary | ICD-10-CM

## 2012-02-26 DIAGNOSIS — I059 Rheumatic mitral valve disease, unspecified: Secondary | ICD-10-CM

## 2012-02-26 DIAGNOSIS — I2510 Atherosclerotic heart disease of native coronary artery without angina pectoris: Secondary | ICD-10-CM

## 2012-02-26 DIAGNOSIS — I10 Essential (primary) hypertension: Secondary | ICD-10-CM

## 2012-02-26 NOTE — Assessment & Plan Note (Signed)
Patient has mild to moderate MR on echo in November 2012. No need to repeat this. Patient is asymptomatic.

## 2012-02-26 NOTE — Assessment & Plan Note (Signed)
Patient had prior bypass surgery and is doing well without any cardiac symptoms.

## 2012-02-26 NOTE — Assessment & Plan Note (Signed)
Patient on pravastatin and followed by [name].

## 2012-02-26 NOTE — Patient Instructions (Signed)
Your physician recommends that you schedule a follow-up appointment in: 1 year  Your physician has requested that you have a carotid duplex. This test is an ultrasound of the carotid arteries in your neck. It looks at blood flow through these arteries that supply the brain with blood. Allow one hour for this exam. There are no restrictions or special instructions.  2 gram sodium diet (information provided)

## 2012-02-26 NOTE — Progress Notes (Signed)
HPI:  This is a 71 year old African American male patient who is here for one year followup. He has history of coronary artery disease status post prior CABG complicated by sternal infection. He also has arteriosclerotic peripheral vascular disease with bilateral carotid bruits and lower extremity claudication symptoms. Stents have been recommended to him in the past but he has declined.  The patient is getting along pretty well. He denies any chest pain, palpitations, dyspnea, dizziness, or presyncope. He does admit to significant leg pain if he goes for a walk around the block. He still would not like to proceed with any treatment. He is followed closely by [name] for his diabetes, hyperlipidemia, and hypertension.  The patient does admit to eating extra salt.  No Known Allergies  Current Outpatient Prescriptions on File Prior to Visit  Medication Sig Dispense Refill  . amLODipine (NORVASC) 10 MG tablet Take 10 mg by mouth daily.        [name] aspirin 81 MG tablet Take 81 mg by mouth daily.        [name] doxazosin (CARDURA) 4 MG tablet Take 4 mg by mouth at bedtime.        . furosemide (LASIX) 40 MG tablet Take 40 mg by mouth daily.        [name] gabapentin (NEURONTIN) 300 MG capsule Take 300 mg by mouth 2 (two) times daily.        [name] glucose blood test strip 1 each by Other route as directed. Use as instructed       . insulin glargine (LANTUS) 100 UNIT/ML injection Inject 35 Units into the skin 2 (two) times daily.        [name] lisinopril (PRINIVIL,ZESTRIL) 20 MG tablet Take 20 mg by mouth daily.        . metFORMIN (GLUCOPHAGE) 1000 MG tablet Take 1,000 mg by mouth 2 (two) times daily with a meal.        . metoprolol (LOPRESSOR) 100 MG tablet Take 100 mg by mouth 2 (two) times daily.        . potassium chloride (KLOR-CON) 20 MEQ packet Take 20 mEq by mouth daily.        . pravastatin (PRAVACHOL) 80 MG tablet Take 1 tablet (80 mg total) by mouth daily. Pt. must have OV to continue refills, he is 3 months  overdue.  30 tablet  0  . timolol (TIMOPTIC) 0.25 % ophthalmic solution 1 drop 2 (two) times daily.          Past Medical History  Diagnosis Date  . History of atherosclerotic cardiovascular disease   . History of myocardial infarction   . PVD (peripheral vascular disease)   . Hypertension   . Hyperlipidemia   . Diabetes mellitus   . Cerebrovascular disease   . Tubulovillous adenoma of colon   . ED (erectile dysfunction)   . Inguinal hernia     Right  . Normocytic anemia   . Glaucoma, left eye   . Peripheral neuropathy     Past Surgical History  Procedure Date  . Coronary artery bypass graft 2002    x6  . Cholecystectomy   . Inguinal hernia repair 2004    Left    Family History  Problem Relation Age of Onset  . Heart attack Mother   . Stroke Father   . Diabetes Sister   . HIV Brother   . Alcohol abuse Brother   . Diabetes Sister   . Heart attack Son  History   Social History  . Marital Status: Married    Spouse Name: [name]    Number of Children: [name]  . Years of Education: [name]   Occupational History  . Retired Sales executive at [employer]    Social History Main Topics  . Smoking status: Former Games developer  . Smokeless tobacco: Not on file  . Alcohol Use: No  . Drug Use: No  . Sexually Active: Not on file   Other Topics Concern  . Not on file   Social History Narrative   Lives with wife [name] and dog JackFinished 4th grade, can read and write some    ROS:see history of present illness otherwise negative   PHYSICAL EXAM: Well-nournished, in no acute distress. Neck: bilateral carotid bruits left greater than right, No JVD, HJR,  or thyroid enlargement Lungs: No tachypnea, clear without wheezing, rales, or rhonchi Cardiovascular: RRR, 2-3/6 systolic murmur at the left sternal border and apex, no gallops, bruit, thrill, or heave. Abdomen: BS normal. Soft without organomegaly, masses, lesions or tenderness. Extremities: without cyanosis, clubbing or  edema. decreased distal pulses bilateral SKin: Warm, no lesions or rashes  Musculoskeletal: No deformities Neuro: no focal signs  BP 145/75  Pulse 60  Resp 16  Ht 5\' 6"  (1.676 m)  Wt 202 lb (91.627 kg)  BMI 32.60 kg/m2   JYN:WGNFAO sinus rhythm with nonspecific T-wave changes  2Decho 10/25/11 Study Conclusions  - Left ventricle: The cavity size was normal. There was   moderate concentric hypertrophy. Systolic function was   normal. The estimated ejection fraction was in the range   of 60% to 65%. Wall motion was normal; there were no   regional wall motion abnormalities. - Aortic valve: Mildly calcified annulus. Trileaflet; normal   thickness leaflets. - Mitral valve: Prolapse. Mild prolapse, involving the   posterior leaflet. Mild to moderate regurgitation directed   eccentrically and toward the septum. - Left atrium: The atrium was mildly to moderately dilated. - Right atrium: The atrium was mildly to moderately dilated. - Atrial septum: No defect or patent foramen ovale was   identified. Impressions:  - Compared to the prior study performed 07/19/10, there has   been no significant interval change; LVH slightly more   impressive.   Carotid dopplers 07/2010 IMPRESSION: Extensive atherosclerotic plaque formation throughout carotid systems bilaterally, greater on left. Significant turbulence and increased velocity flow in left ECA compatible with stenosis, with evidence of increase in peak systolic velocity in the left ECA since previous exam. No additional hemodynamically significant stenoses identified.

## 2012-02-26 NOTE — Assessment & Plan Note (Signed)
Patient is overdue for carotid Dopplers, we will order.

## 2012-08-13 ENCOUNTER — Other Ambulatory Visit: Payer: Self-pay | Admitting: Cardiology

## 2013-05-19 ENCOUNTER — Ambulatory Visit (HOSPITAL_COMMUNITY)
Admission: RE | Admit: 2013-05-19 | Discharge: 2013-05-19 | Disposition: A | Discharge status: Home / Self Care | Payer: Medicare Other | Source: Ambulatory Visit | Attending: Family Medicine | Admitting: Family Medicine

## 2013-05-19 ENCOUNTER — Other Ambulatory Visit (HOSPITAL_COMMUNITY): Payer: Self-pay | Admitting: Family Medicine

## 2013-05-19 DIAGNOSIS — M7730 Calcaneal spur, unspecified foot: Secondary | ICD-10-CM | POA: Insufficient documentation

## 2013-05-19 DIAGNOSIS — M25579 Pain in unspecified ankle and joints of unspecified foot: Secondary | ICD-10-CM | POA: Insufficient documentation

## 2013-05-19 DIAGNOSIS — R52 Pain, unspecified: Secondary | ICD-10-CM

## 2013-05-21 ENCOUNTER — Telehealth: Payer: Self-pay | Admitting: *Deleted

## 2013-05-21 NOTE — Telephone Encounter (Signed)
Spoke with Larita Fife to request pt recent labs per noted in PCP office this week and advised he had labs with one of his other Dr's but not sure, advised pt is overdue for OV and labs with our office since 02-25-13, per was advised on 02-26-12 to f/u in a year, will contact pt to advise he is overdue for apt. Sent message to [facility] to call pt to advise.

## 2013-05-28 NOTE — Telephone Encounter (Signed)
F/u apt scheduled Wednesday 06/23/13.. Patient is aware

## 2013-06-23 ENCOUNTER — Ambulatory Visit: Payer: Medicare Other | Admitting: Physician Assistant

## 2013-06-25 ENCOUNTER — Telehealth: Payer: Self-pay | Admitting: *Deleted

## 2013-06-25 ENCOUNTER — Encounter: Payer: Self-pay | Admitting: Adult Health

## 2013-06-25 ENCOUNTER — Emergency Department (HOSPITAL_COMMUNITY)
Admission: EM | Admit: 2013-06-25 | Discharge: 2013-06-26 | Disposition: A | Discharge status: Home / Self Care | Payer: Medicare Other | Attending: Emergency Medicine | Admitting: Emergency Medicine

## 2013-06-25 ENCOUNTER — Encounter (HOSPITAL_COMMUNITY): Payer: Self-pay

## 2013-06-25 ENCOUNTER — Emergency Department (HOSPITAL_COMMUNITY): Payer: Medicare Other

## 2013-06-25 ENCOUNTER — Ambulatory Visit (INDEPENDENT_AMBULATORY_CARE_PROVIDER_SITE_OTHER): Payer: Medicare Other | Admitting: Adult Health

## 2013-06-25 ENCOUNTER — Encounter (HOSPITAL_COMMUNITY): Payer: Self-pay | Admitting: *Deleted

## 2013-06-25 ENCOUNTER — Ambulatory Visit (HOSPITAL_COMMUNITY): Admit: 2013-06-25 | Payer: Self-pay | Admitting: Cardiovascular Disease

## 2013-06-25 VITALS — BP 197/81 | HR 65 | Ht 66.0 in | Wt 200.2 lb

## 2013-06-25 DIAGNOSIS — Z860100 Personal history of colon polyps, unspecified: Secondary | ICD-10-CM | POA: Insufficient documentation

## 2013-06-25 DIAGNOSIS — E785 Hyperlipidemia, unspecified: Secondary | ICD-10-CM | POA: Insufficient documentation

## 2013-06-25 DIAGNOSIS — D649 Anemia, unspecified: Secondary | ICD-10-CM | POA: Insufficient documentation

## 2013-06-25 DIAGNOSIS — Z951 Presence of aortocoronary bypass graft: Secondary | ICD-10-CM | POA: Insufficient documentation

## 2013-06-25 DIAGNOSIS — I059 Rheumatic mitral valve disease, unspecified: Secondary | ICD-10-CM

## 2013-06-25 DIAGNOSIS — I252 Old myocardial infarction: Secondary | ICD-10-CM | POA: Insufficient documentation

## 2013-06-25 DIAGNOSIS — I10 Essential (primary) hypertension: Secondary | ICD-10-CM

## 2013-06-25 DIAGNOSIS — Z79899 Other long term (current) drug therapy: Secondary | ICD-10-CM | POA: Insufficient documentation

## 2013-06-25 DIAGNOSIS — E119 Type 2 diabetes mellitus without complications: Secondary | ICD-10-CM | POA: Insufficient documentation

## 2013-06-25 DIAGNOSIS — I2510 Atherosclerotic heart disease of native coronary artery without angina pectoris: Secondary | ICD-10-CM | POA: Insufficient documentation

## 2013-06-25 DIAGNOSIS — H409 Unspecified glaucoma: Secondary | ICD-10-CM | POA: Insufficient documentation

## 2013-06-25 DIAGNOSIS — R531 Weakness: Secondary | ICD-10-CM

## 2013-06-25 DIAGNOSIS — I739 Peripheral vascular disease, unspecified: Secondary | ICD-10-CM

## 2013-06-25 DIAGNOSIS — R5381 Other malaise: Secondary | ICD-10-CM | POA: Insufficient documentation

## 2013-06-25 DIAGNOSIS — R55 Syncope and collapse: Secondary | ICD-10-CM | POA: Insufficient documentation

## 2013-06-25 DIAGNOSIS — R0989 Other specified symptoms and signs involving the circulatory and respiratory systems: Secondary | ICD-10-CM

## 2013-06-25 DIAGNOSIS — Z7982 Long term (current) use of aspirin: Secondary | ICD-10-CM | POA: Insufficient documentation

## 2013-06-25 DIAGNOSIS — G609 Hereditary and idiopathic neuropathy, unspecified: Secondary | ICD-10-CM | POA: Insufficient documentation

## 2013-06-25 DIAGNOSIS — Z8679 Personal history of other diseases of the circulatory system: Secondary | ICD-10-CM | POA: Insufficient documentation

## 2013-06-25 DIAGNOSIS — Z87448 Personal history of other diseases of urinary system: Secondary | ICD-10-CM | POA: Insufficient documentation

## 2013-06-25 DIAGNOSIS — Z794 Long term (current) use of insulin: Secondary | ICD-10-CM | POA: Insufficient documentation

## 2013-06-25 DIAGNOSIS — Z8719 Personal history of other diseases of the digestive system: Secondary | ICD-10-CM | POA: Insufficient documentation

## 2013-06-25 LAB — BASIC METABOLIC PANEL
GFR calc Af Amer: 70 mL/min — ABNORMAL LOW (ref >90)
GFR calc non Af Amer: 60 mL/min — ABNORMAL LOW (ref >90)
Potassium: 4.6 mEq/L (ref 3.5–5.1)
Sodium: 133 mEq/L — ABNORMAL LOW (ref 135–145)

## 2013-06-25 LAB — CBC WITH DIFFERENTIAL/PLATELET
Basophils Absolute: 0 10*3/uL (ref 0.0–0.1)
Basophils Relative: 0 % (ref 0–1)
Eosinophils Absolute: 0.3 10*3/uL (ref 0.0–0.7)
MCH: 30.4 pg (ref 26.0–34.0)
MCHC: 34.3 g/dL (ref 30.0–36.0)
Neutrophils Relative %: 75 % (ref 43–77)
Platelets: 161 10*3/uL (ref 150–400)
RBC: 4.25 MIL/uL (ref 4.22–5.81)

## 2013-06-25 LAB — URINALYSIS, ROUTINE W REFLEX MICROSCOPIC
Leukocytes, UA: NEGATIVE
Protein, ur: NEGATIVE mg/dL
Urobilinogen, UA: 1 mg/dL (ref 0.0–1.0)

## 2013-06-25 LAB — URINE MICROSCOPIC-ADD ON

## 2013-06-25 SURGERY — LEFT HEART CATHETERIZATION WITH CORONARY/GRAFT ANGIOGRAM
Anesthesia: LOCAL

## 2013-06-25 NOTE — Patient Instructions (Addendum)
Your physician recommends that you schedule a follow-up appointment in: 6 MONTHS You will receive a reminder letter in the mail in about 4 months reminding you to call and schedule your appointment. If you don't receive this letter, please contact our office.  Your physician has requested that you have an echocardiogram. Echocardiography is a painless test that uses sound waves to create images of your heart. It provides your doctor with information about the size and shape of your heart and how well your heart's chambers and valves are working. This procedure takes approximately one hour. There are no restrictions for this procedure.

## 2013-06-25 NOTE — ED Notes (Signed)
Patient transported to x-ray. ?

## 2013-06-25 NOTE — Assessment & Plan Note (Signed)
He has bilateral carotid artery disease with extensive atherosclerotic plaque formation throughout carotid systems bilaterally, greater on left.He refuses any intervention at this time. I discussed with him the possibility of having a CVA associated with his carotid artery disease. He verbalizes understanding but is very reluctant to undergo any type of operation. I will continue to try and convince him on followup appointments to at least be seen by a vascular surgeon for evaluation. At this time he refuses.

## 2013-06-25 NOTE — Assessment & Plan Note (Signed)
The patient has had echocardiogram in 2012. He has a significant systolic murmur and aortic valve area. I will repeat his echocardiogram for reevaluation for progression of calcifications noted on his aortic valve annulus in 2012.

## 2013-06-25 NOTE — Assessment & Plan Note (Signed)
He offers no cardiac complaints. He denies chest pain, dyspnea on exertion, fatigue. He remains medically compliant. He will be continued on aspirin, metoprolol. Continue risk modification, avoiding salty foods, cholesterol rich foods, and increase his exercise. Walking has become difficult for him with peripheral vascular disease and symptoms of intermittent claudication however.

## 2013-06-25 NOTE — Progress Notes (Deleted)
Name: Marcus NOHR Sr.    DOB: 27-Nov-1941  Age: 72 y.o.  MR#: [id number]       PCP:  [name], MD      Insurance: Payor: BLUE CROSS BLUE [employer] OF [place] MEDICARE / Plan: BLUE MEDICARE / Product Type: *No Product type* /   CC:    Chief Complaint  Patient presents with  . Coronary Artery Disease    VS Filed Vitals:   06/25/13 1135  BP: 197/81  Pulse: 65  Height: 5\' 6"  (1.676 m)  Weight: 200 lb 4 oz (90.833 kg)    Weights Current Weight  06/25/13 200 lb 4 oz (90.833 kg)  02/26/12 202 lb (91.627 kg)  07/23/10 199 lb (90.266 kg)    Blood Pressure  BP Readings from Last 3 Encounters:  06/25/13 197/81  02/26/12 145/75  07/23/10 141/66     Admit date:  (Not on file) Last encounter with RMR:  Visit date not found   Allergy Review of patient's allergies indicates no known allergies.  Current Outpatient Prescriptions  Medication Sig Dispense Refill  . amLODipine (NORVASC) 10 MG tablet Take 10 mg by mouth daily.        [name] aspirin 81 MG tablet Take 81 mg by mouth daily.        [name] doxazosin (CARDURA) 4 MG tablet TAKE 1 TABLET ONCE DAILY.  30 tablet  6  . furosemide (LASIX) 40 MG tablet Take 40 mg by mouth daily.        [name] gabapentin (NEURONTIN) 300 MG capsule Take 300 mg by mouth 2 (two) times daily.        [name] glucose blood test strip 1 each by Other route as directed. Use as instructed       . insulin glargine (LANTUS) 100 UNIT/ML injection Inject 35 Units into the skin 2 (two) times daily.        [name] lisinopril (PRINIVIL,ZESTRIL) 20 MG tablet Take 20 mg by mouth daily.        . metFORMIN (GLUCOPHAGE) 1000 MG tablet Take 1,000 mg by mouth 2 (two) times daily with a meal.        . metoprolol (LOPRESSOR) 100 MG tablet Take 100 mg by mouth 2 (two) times daily.        . potassium chloride (KLOR-CON) 20 MEQ packet Take 20 mEq by mouth daily.        . pravastatin (PRAVACHOL) 80 MG tablet Take 1 tablet (80 mg total) by mouth daily. Pt. must have OV to continue refills, he is 3  months overdue.  30 tablet  0  . timolol (TIMOPTIC) 0.25 % ophthalmic solution 1 drop 2 (two) times daily.         No current facility-administered medications for this visit.    Discontinued Meds:    Medications Discontinued During This Encounter  Medication Reason  . potassium chloride SA (K-DUR,KLOR-CON) 20 MEQ tablet Error    Patient Active Problem List   Diagnosis Date Noted  . CAROTID BRUITS, BILATERAL 07/16/2010  . MITRAL VALVE PROLAPSE 08/22/2009  . DM 07/22/2009  . ATHEROSCLEROTIC CARDIOVASCULAR DISEASE 07/22/2009  . 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 ERECTILE DYSFUNCTION 05/27/2008  . INGUINAL HERNIA, RIGHT 10/19/2007  . ANEMIA, NORMOCYTIC 10/09/2007  . HYPERLIPIDEMIA 12/19/2006  . PERIPHERAL NEUROPATHY 12/19/2006  . GLAUCOMA, LEFT EYE 12/19/2006  . HYPERTENSION 12/19/2006  . MYOCARDIAL INFARCTION, HX OF 12/19/2006  . PERIPHERAL VASCULAR DISEASE 12/19/2006    LABS    Component Value Date/Time  NA 142 02/02/2010 1338   NA 137 11/14/2009 2212   NA 137 11/14/2009   K 3.8 02/02/2010 1338   K 4.8 11/14/2009 2212   K 4.8 11/14/2009   CL 103 02/02/2010 1338   CL 97 11/14/2009 2212   CL 97 11/14/2009   CO2 28 02/02/2010 1338   CO2 28 11/14/2009 2212   CO2 28 11/14/2009   GLUCOSE 49 02/02/2010 1338   GLUCOSE 302* 11/14/2009 2212   GLUCOSE 302 11/14/2009   BUN 13 02/02/2010 1338   BUN 14 11/14/2009 2212   BUN 14 11/14/2009   CREATININE 0.89 02/02/2010 1338   CREATININE 1.00 11/14/2009 2212   CREATININE 1.00 11/14/2009   CALCIUM 9.3 02/02/2010 1338   CALCIUM 9.1 11/14/2009 2212   CALCIUM 9.1 11/14/2009   GFRNONAA >60 03/13/2008 1433   GFRAA  Value: >60        The eGFR has been calculated using the MDRD equation. This calculation has not been validated in all clinical 03/13/2008 1433   CMP     Component Value Date/Time   NA 142 02/02/2010 1338   K 3.8 02/02/2010 1338   CL 103 02/02/2010 1338   CO2 28 02/02/2010 1338   GLUCOSE 49 02/02/2010 1338   BUN 13  02/02/2010 1338   CREATININE 0.89 02/02/2010 1338   CALCIUM 9.3 02/02/2010 1338   PROT 6.5 02/02/2010 1338   ALBUMIN 4.1 02/02/2010 1338   AST 18 02/02/2010 1338   ALT 9 02/02/2010 1338   ALKPHOS 62 02/02/2010 1338   BILITOT 0.7 10/04/2009 2014   GFRNONAA >60 03/13/2008 1433   GFRAA  Value: >60        The eGFR has been calculated using the MDRD equation. This calculation has not been validated in all clinical 03/13/2008 1433       Component Value Date/Time   WBC 6.2 02/02/2010 1338   WBC 7.1 10/04/2009 2014   WBC 7.5 06/23/2009 2335   HGB 12.6 02/02/2010 1338   HGB 12.5* 10/04/2009 2014   HGB 12.2* 06/23/2009 2335   HCT 40.3 02/02/2010 1338   HCT 40.0 10/04/2009 2014   HCT 36.9* 06/23/2009 2335   MCV 94.8 02/02/2010 1338   MCV 94.8 10/04/2009 2014   MCV 90.2 06/23/2009 2335    Lipid Panel     Component Value Date/Time   CHOL 136 08/22/2010 2039   TRIG 53 08/22/2010 2039   HDL 43 08/22/2010 2039   CHOLHDL 3.2 Ratio 08/22/2010 2039   VLDL 11 08/22/2010 2039   LDLCALC 82 08/22/2010 2039    ABG No results found for this basename: phart, pco2, pco2art, po2, po2art, hco3, tco2, acidbasedef, o2sat     Lab Results  Component Value Date   TSH 5.253* 04/04/2009   BNP (last 3 results) No results found for this basename: PROBNP,  in the last 8760 hours Cardiac Panel (last 3 results) No results found for this basename: CKTOTAL, CKMB, TROPONINI, RELINDX,  in the last 72 hours  Iron/TIBC/Ferritin    Component Value Date/Time   IRON 86 05/27/2008 2341   TIBC 288 05/27/2008 2341     EKG Orders placed in visit on 02/26/12  . EKG 12-LEAD     Prior Assessment and Plan Problem List as of 06/25/2013   TUBULOVILLOUS ADENOMA, COLON   DM   HYPERLIPIDEMIA   Last Assessment & Plan   02/26/2012 Office Visit Written 02/26/2012  2:39 PM by Dyann Kief, PA     Patient on pravastatin and followed  by [name].    ANEMIA, NORMOCYTIC   ERECTILE DYSFUNCTION   PERIPHERAL NEUROPATHY   GLAUCOMA, LEFT EYE    HYPERTENSION   MYOCARDIAL INFARCTION, HX OF   MITRAL VALVE PROLAPSE   Last Assessment & Plan   02/26/2012 Office Visit Written 02/26/2012  2:38 PM by Dyann Kief, PA     Patient has mild to moderate MR on echo in November 2012. No need to repeat this. Patient is asymptomatic.    ATHEROSCLEROTIC CARDIOVASCULAR DISEASE   Last Assessment & Plan   02/26/2012 Office Visit Written 02/26/2012  2:36 PM by Dyann Kief, PA     Patient had prior bypass surgery and is doing well without any cardiac symptoms.    PERIPHERAL VASCULAR DISEASE   INGUINAL HERNIA, RIGHT   CAROTID BRUITS, BILATERAL   Last Assessment & Plan   02/26/2012 Office Visit Written 02/26/2012  2:38 PM by Dyann Kief, PA     Patient is overdue for carotid Dopplers, we will order.        Imaging: No results found.

## 2013-06-25 NOTE — Progress Notes (Signed)
HPI: [name] is a 72 year old patient of [name] we are following for ongoing assessment and management of CAD, status post CABG, with peripheral vascular disease, bilateral carotid bruits, and lower chin the claudication symptoms. The patient was most recently seen in the office in March of 2013 is followed by [name] for diabetes hypertension and hyperlipidemia. Most recent carotid Dopplers in August of 2011 revealing extensive atherosclerotic plaque formation throughout the carotid systems bilaterally greater on the left. The patient has refused intervention in the past.    He comes today for annual evaluation. He is without complaints today with the exception of chronic lower extremity pain intermittent claudication symptoms. He has not been hospitalized, and to the emergency room, had any surgeries, or been diagnosed with any new illnesses since being seen last. He denies symptoms of chest pain, dizziness, or dyspnea on exertion.  No Known Allergies  Current Outpatient Prescriptions  Medication Sig Dispense Refill  . amLODipine (NORVASC) 10 MG tablet Take 10 mg by mouth daily.        [name] aspirin 81 MG tablet Take 81 mg by mouth daily.        [name] doxazosin (CARDURA) 4 MG tablet TAKE 1 TABLET ONCE DAILY.  30 tablet  6  . furosemide (LASIX) 40 MG tablet Take 40 mg by mouth daily.        [name] gabapentin (NEURONTIN) 300 MG capsule Take 300 mg by mouth 2 (two) times daily.        [name] glucose blood test strip 1 each by Other route as directed. Use as instructed       . insulin glargine (LANTUS) 100 UNIT/ML injection Inject 35 Units into the skin 2 (two) times daily.        [name] lisinopril (PRINIVIL,ZESTRIL) 20 MG tablet Take 20 mg by mouth daily.        . metFORMIN (GLUCOPHAGE) 1000 MG tablet Take 1,000 mg by mouth 2 (two) times daily with a meal.        . metoprolol (LOPRESSOR) 100 MG tablet Take 100 mg by mouth 2 (two) times daily.        . potassium chloride (KLOR-CON) 20 MEQ packet Take 20  mEq by mouth daily.        . pravastatin (PRAVACHOL) 80 MG tablet Take 1 tablet (80 mg total) by mouth daily. Pt. must have OV to continue refills, he is 3 months overdue.  30 tablet  0  . timolol (TIMOPTIC) 0.25 % ophthalmic solution 1 drop 2 (two) times daily.         No current facility-administered medications for this visit.    Past Medical History  Diagnosis Date  . History of atherosclerotic cardiovascular disease   . History of myocardial infarction   . PVD (peripheral vascular disease)   . Hypertension   . Hyperlipidemia   . Diabetes mellitus   . Cerebrovascular disease   . Tubulovillous adenoma of colon   . ED (erectile dysfunction)   . Inguinal hernia     Right  . Normocytic anemia   . Glaucoma, left eye   . Peripheral neuropathy     Past Surgical History  Procedure Laterality Date  . Coronary artery bypass graft  2002    x6  . Cholecystectomy    . Inguinal hernia repair  2004    Left    ZOX:WRUEAV of systems complete and found to be negative unless listed above PHYSICAL EXAM BP 197/81  Pulse 65  Ht 5\' 6"  (1.676 m)  Wt 200 lb 4 oz (90.833 kg)  BMI 32.34 kg/m2  General: Well developed, well nourished, in no acute distress Head: Eyes PERRLA, No xanthomas.   Normal cephalic and atramatic  Lungs: Clear bilaterally to auscultation and percussion. Heart: HRRR S1 S2, 2/6 systolic murmur heard best at the RSB,  Pulses are 2+ & equal.            Bilateral  carotid bruits L>R. No JVD.  No abdominal bruits.Positive bilateral femoral bruits. Abdomen: Bowel sounds are positive, abdomen soft and non-tender without masses or                  Hernia's noted. Msk:  Back normal, normal gait. Normal strength and tone for age. Extremities: No clubbing, cyanosis or edema.  DP diminished Neuro: Alert and oriented X 3. Psych:  Good affect, responds appropriately    ASSESSMENT AND PLAN

## 2013-06-25 NOTE — ED Notes (Signed)
Pt arrived by [employer]. Wife called ems for possible diabetes issue, pt was altered on ems arrival, only responsive to pain. Had elevation on ekg, but code stemi cancelled after pt arrival. Pt is responsive to verbal stimuli, diaphoretic, denies any cp or sob at this time.

## 2013-06-25 NOTE — Telephone Encounter (Signed)
Called pt and advised him to pick up samples of Bidil one tablet TID at front desk and to have a BP check in 2 weeks per verbal instructions from KL,NP. Pt understood.

## 2013-06-25 NOTE — Assessment & Plan Note (Addendum)
Blood pressure was elevated on this visit initially. After assessment in the exam room I have repeated his blood pressure by manual cough, be 160/70. Still not optimal for a patient with diabetes and peripheral disease. He states that he has been eating a lot of pork over the last several weeks. Review of past blood pressures demonstrated elevated pressures at 145 and 141 systolically.  He is advised to decrease his salt intake. We will consider adding hydralazine or Bidil on next visit

## 2013-06-25 NOTE — ED Notes (Signed)
Cancelled CODE STEMI per Dr. Sanjuana Kava

## 2013-06-25 NOTE — Assessment & Plan Note (Signed)
He continues to have intermittent claudication symptoms. He is aware that he has peripheral vascular disease and will probably need intervention. He is very reluctant to proceed with any type of surgery or intervention at this time. We will see him again in 6 months. If symptoms persist Will begin Pletal, and will try and talk with him more about intervention.

## 2013-06-26 NOTE — ED Provider Notes (Signed)
History    CSN: [id number] Arrival date & time 06/25/13  1718  First MD Initiated Contact with Patient 06/25/13 1738     Chief Complaint  Patient presents with  . Altered Mental Status    The history is provided by the patient.   patient was started on Imdur by his cardiologist today and he states that 30 minutes after taking the pill he became sweaty and felt weak.  His wife thought that his blood sugar was dropping and try to give him food and then he became even more weak and EMS was called.  On EMS arrival his blood sugar was in the 300s.  He was brought to the emergency department as a possible code STEMI they're concerned about some subtle inferior ST changes.  The patient was seen on arrival by the cardiologist and the code STEMI was canceled.  The patient reports no chest pain or chest tightness.  He states he feels much better at this time.  EMS started to get some IV fluids in route and it sounds as though the patient improved in route.  The patient has absolutely no complaints at this time.  No recent nausea vomiting or diarrhea.  No fevers or chills.  No shortness of breath.  He does have a history of coronary artery bypass grafting.  He states compliance with his medications. Past Medical History  Diagnosis Date  . History of atherosclerotic cardiovascular disease   . History of myocardial infarction   . PVD (peripheral vascular disease)   . Hypertension   . Hyperlipidemia   . Diabetes mellitus   . Cerebrovascular disease   . Tubulovillous adenoma of colon   . ED (erectile dysfunction)   . Inguinal hernia     Right  . Normocytic anemia   . Glaucoma, left eye   . Peripheral neuropathy    Past Surgical History  Procedure Laterality Date  . Coronary artery bypass graft  2002    x6  . Cholecystectomy    . Inguinal hernia repair  2004    Left   Family History  Problem Relation Age of Onset  . Heart attack Mother   . Stroke Father   . Diabetes Sister   . HIV  Brother   . Alcohol abuse Brother   . Diabetes Sister   . Heart attack Son    History  Substance Use Topics  . Smoking status: Former Games developer  . Smokeless tobacco: Not on file  . Alcohol Use: No    Review of Systems  Psychiatric/Behavioral: Positive for altered mental status.  All other systems reviewed and are negative.    Allergies  Review of patient's allergies indicates no known allergies.  Home Medications   Current Outpatient Rx  Name  Route  Sig  Dispense  Refill  . amLODipine (NORVASC) 10 MG tablet   Oral   Take 10 mg by mouth daily.           [name] aspirin 81 MG tablet   Oral   Take 81 mg by mouth daily.           [name] doxazosin (CARDURA) 4 MG tablet   Oral   Take 4 mg by mouth every evening.         . furosemide (LASIX) 40 MG tablet   Oral   Take 40 mg by mouth daily.           [name] gabapentin (NEURONTIN) 300 MG capsule   Oral  Take 300 mg by mouth 2 (two) times daily.           . insulin glargine (LANTUS) 100 UNIT/ML injection   Subcutaneous   Inject 70 Units into the skin every morning.          . isosorbide-hydrALAZINE (BIDIL) 20-37.5 MG per tablet   Oral   Take 1 tablet by mouth 2 (two) times daily.          [name] lisinopril (PRINIVIL,ZESTRIL) 20 MG tablet   Oral   Take 20 mg by mouth daily.           . metoprolol (LOPRESSOR) 100 MG tablet   Oral   Take 100 mg by mouth 2 (two) times daily.           . potassium chloride SA (K-DUR,KLOR-CON) 20 MEQ tablet   Oral   Take 20 mEq by mouth daily.         . pravastatin (PRAVACHOL) 80 MG tablet   Oral   Take 80 mg by mouth every morning. Pt. must have OV to continue refills, he is 3 months overdue.         . timolol (TIMOPTIC) 0.25 % ophthalmic solution   Both Eyes   Place 1 drop into both eyes every morning.          . vitamin B-12 (CYANOCOBALAMIN) 1000 MCG tablet   Oral   Take 2,000 mcg by mouth daily.         [name] glucose blood test strip   Other   1 each by Other route  as directed. Use as instructed           BP 152/62  Pulse 63  Temp(Src) 97.6 F (36.4 C) (Oral)  Resp 18  SpO2 100% Physical Exam  Nursing note and vitals reviewed. Constitutional: He is oriented to person, place, and time. He appears well-developed and well-nourished.  HENT:  Head: Normocephalic and atraumatic.  Eyes: EOM are normal.  Neck: Normal range of motion.  Cardiovascular: Normal rate, regular rhythm, normal heart sounds and intact distal pulses.   Pulmonary/Chest: Effort normal and breath sounds normal. No respiratory distress.  Abdominal: Soft. He exhibits no distension. There is no tenderness.  Genitourinary: Rectum normal.  Musculoskeletal: Normal range of motion.  Neurological: He is alert and oriented to person, place, and time.  Skin: Skin is warm and dry.  Psychiatric: He has a normal mood and affect. Judgment normal.    ED Course  Procedures (including critical care time)   Date: 06/26/2013  Rate: 60  Rhythm: normal sinus rhythm  QRS Axis: normal  Intervals: normal  ST/T Wave abnormalities: normal  Conduction Disutrbances: none  Narrative Interpretation:   Old EKG Reviewed: No significant changes noted     Labs Reviewed  CBC WITH DIFFERENTIAL - Abnormal; Notable for the following:    WBC 11.7 (*)    Hemoglobin 12.9 (*)    HCT 37.6 (*)    Neutro Abs 8.8 (*)    All other components within normal limits  BASIC METABOLIC PANEL - Abnormal; Notable for the following:    Sodium 133 (*)    Glucose, Bld 313 (*)    GFR calc non Af Amer 60 (*)    GFR calc Af Amer 70 (*)    All other components within normal limits  URINALYSIS, ROUTINE W REFLEX MICROSCOPIC - Abnormal; Notable for the following:    Color, Urine AMBER (*)    Glucose, UA >1000 (*)  All other components within normal limits  URINE MICROSCOPIC-ADD ON - Abnormal; Notable for the following:    Casts HYALINE CASTS (*)    All other components within normal limits  TROPONIN I   Dg Chest  2 View  06/25/2013   *RADIOLOGY REPORT*  Clinical Data: Altered mental status  CHEST - 2 VIEW  Comparison: 03/13/2008  Findings: Cardiac enlargement with prior cardiac surgery.  Negative for heart failure.  Mild left lower lobe atelectasis.  Negative for pneumonia.  Small pleural effusions on the lateral view.  IMPRESSION: Mild left lower lobe atelectasis.  Negative for heart failure or pneumonia.   Original Report Authenticated By: Janeece Riggers, M.D.   I personally reviewed the imaging tests through PACS system  I reviewed available ER/hospitalization records through the EMR   1. Weakness   2. Near syncope     MDM  Patient was observed and monitored in the emergency department.  No arrhythmias noted.  Patient's EKG is normal sinus rhythm without concerning ST changes.  Labs urine and troponin are without abnormality.  The patient's event sound vagal possibly related to orthostatic hypotension secondary to use of the Imdur.  We'll discontinue the Imdur and followup with his physician.  Lyanne Co, MD 06/26/13 [phone]

## 2013-06-29 ENCOUNTER — Ambulatory Visit (HOSPITAL_COMMUNITY)
Admission: RE | Admit: 2013-06-29 | Discharge: 2013-06-29 | Disposition: A | Discharge status: Home / Self Care | Payer: Medicare Other | Source: Ambulatory Visit | Attending: Adult Health | Admitting: Adult Health

## 2013-06-29 ENCOUNTER — Telehealth: Payer: Self-pay | Admitting: *Deleted

## 2013-06-29 DIAGNOSIS — E119 Type 2 diabetes mellitus without complications: Secondary | ICD-10-CM | POA: Insufficient documentation

## 2013-06-29 DIAGNOSIS — I25810 Atherosclerosis of coronary artery bypass graft(s) without angina pectoris: Secondary | ICD-10-CM | POA: Insufficient documentation

## 2013-06-29 DIAGNOSIS — I2510 Atherosclerotic heart disease of native coronary artery without angina pectoris: Secondary | ICD-10-CM

## 2013-06-29 DIAGNOSIS — I10 Essential (primary) hypertension: Secondary | ICD-10-CM

## 2013-06-29 DIAGNOSIS — I059 Rheumatic mitral valve disease, unspecified: Secondary | ICD-10-CM

## 2013-06-29 NOTE — Progress Notes (Signed)
*  PRELIMINARY RESULTS* Echocardiogram 2D Echocardiogram has been performed.  Marcus Hendrix 06/29/2013, 2:30 PM

## 2013-06-30 NOTE — Telephone Encounter (Signed)
Open in error

## 2013-07-09 ENCOUNTER — Ambulatory Visit (INDEPENDENT_AMBULATORY_CARE_PROVIDER_SITE_OTHER): Payer: Medicare Other | Admitting: *Deleted

## 2013-07-09 DIAGNOSIS — I10 Essential (primary) hypertension: Secondary | ICD-10-CM

## 2013-07-09 MED ORDER — LISINOPRIL 20 MG PO TABS: 20.0000 mg | ORAL_TABLET | Freq: Two times a day (BID) | ORAL | Status: DC

## 2013-07-09 NOTE — Patient Instructions (Addendum)
Your physician recommends that you schedule a follow-up appointment in: TO BE DETERMINED   Your physician has recommended you make the following change in your medication:   1) INCREASE LISINOPRIL 20MG  TO TAKE TWICE DAILY 2) DO NOT TAKE BIDIL-THIS HAS BEEN ADDED TO YOUR ALLERGY LIST

## 2013-07-09 NOTE — Progress Notes (Signed)
Pt presented to nurse visit today per recent medication changes Pt noted he took Bidil samples given at last OV, noted he passed out and was taken to the ED, pt was advised to stop taking medication, noted elevated BP in OV today, Dr. Purvis Sheffield gave verbal instructions for pt to increase Lisinopril 20mg  BID Pt voiced no complaints today Please advise if any further instructions are neccessary   Cala Bradford A. Gaines Cartmell L.P.N.  Last OV D/C Summary and VS:  BP 197/81  Pulse 65  Ht 5\' 6"  (1.676 m)  Wt 200 lb 4 oz (90.833 kg)  BMI 32.34 kg/m2   Your physician recommends that you schedule a follow-up appointment in: 6 MONTHS You will receive a reminder letter in the mail in about 4 months reminding you to call and schedule your appointment. If you don't receive this letter, please contact our office.  Your physician has requested that you have an echocardiogram. Echocardiography is a painless test that uses sound waves to create images of your heart. It provides your doctor with information about the size and shape of your heart and how well your heart's chambers and valves are working. This procedure takes approximately one hour. There are no restrictions for this procedure.

## 2013-07-22 NOTE — Progress Notes (Signed)
Reviewed

## 2013-07-30 ENCOUNTER — Inpatient Hospital Stay (HOSPITAL_COMMUNITY)
Admission: EM | Admit: 2013-07-30 | Discharge: 2013-08-03 | DRG: 195 | Disposition: A | Discharge status: Home / Self Care | Payer: Medicare Other | Attending: Family Medicine | Admitting: Family Medicine

## 2013-07-30 ENCOUNTER — Emergency Department (HOSPITAL_COMMUNITY): Payer: Medicare Other

## 2013-07-30 ENCOUNTER — Encounter (HOSPITAL_COMMUNITY): Payer: Self-pay

## 2013-07-30 DIAGNOSIS — I10 Essential (primary) hypertension: Secondary | ICD-10-CM | POA: Diagnosis present

## 2013-07-30 DIAGNOSIS — E7800 Pure hypercholesterolemia, unspecified: Secondary | ICD-10-CM | POA: Diagnosis present

## 2013-07-30 DIAGNOSIS — Z794 Long term (current) use of insulin: Secondary | ICD-10-CM

## 2013-07-30 DIAGNOSIS — E119 Type 2 diabetes mellitus without complications: Secondary | ICD-10-CM | POA: Diagnosis present

## 2013-07-30 DIAGNOSIS — Z79899 Other long term (current) drug therapy: Secondary | ICD-10-CM

## 2013-07-30 DIAGNOSIS — Z8249 Family history of ischemic heart disease and other diseases of the circulatory system: Secondary | ICD-10-CM

## 2013-07-30 DIAGNOSIS — J189 Pneumonia, unspecified organism: Principal | ICD-10-CM

## 2013-07-30 DIAGNOSIS — E785 Hyperlipidemia, unspecified: Secondary | ICD-10-CM | POA: Diagnosis present

## 2013-07-30 DIAGNOSIS — I252 Old myocardial infarction: Secondary | ICD-10-CM

## 2013-07-30 DIAGNOSIS — Z823 Family history of stroke: Secondary | ICD-10-CM

## 2013-07-30 DIAGNOSIS — Z833 Family history of diabetes mellitus: Secondary | ICD-10-CM

## 2013-07-30 DIAGNOSIS — I739 Peripheral vascular disease, unspecified: Secondary | ICD-10-CM | POA: Diagnosis present

## 2013-07-30 DIAGNOSIS — Z87891 Personal history of nicotine dependence: Secondary | ICD-10-CM

## 2013-07-30 DIAGNOSIS — H5440 Blindness, one eye, unspecified eye: Secondary | ICD-10-CM | POA: Diagnosis present

## 2013-07-30 DIAGNOSIS — Z951 Presence of aortocoronary bypass graft: Secondary | ICD-10-CM

## 2013-07-30 DIAGNOSIS — I2510 Atherosclerotic heart disease of native coronary artery without angina pectoris: Secondary | ICD-10-CM | POA: Diagnosis present

## 2013-07-30 DIAGNOSIS — G609 Hereditary and idiopathic neuropathy, unspecified: Secondary | ICD-10-CM | POA: Diagnosis present

## 2013-07-30 DIAGNOSIS — E860 Dehydration: Secondary | ICD-10-CM | POA: Diagnosis present

## 2013-07-30 LAB — CBC WITH DIFFERENTIAL/PLATELET
Basophils Relative: 0 % (ref 0–1)
Eosinophils Absolute: 0.2 10*3/uL (ref 0.0–0.7)
HCT: 37.4 % — ABNORMAL LOW (ref 39.0–52.0)
Hemoglobin: 12.3 g/dL — ABNORMAL LOW (ref 13.0–17.0)
Lymphs Abs: 1.6 10*3/uL (ref 0.7–4.0)
MCH: 30.4 pg (ref 26.0–34.0)
MCHC: 32.9 g/dL (ref 30.0–36.0)
Monocytes Absolute: 0.4 10*3/uL (ref 0.1–1.0)
Monocytes Relative: 5 % (ref 3–12)
Neutro Abs: 7 10*3/uL (ref 1.7–7.7)

## 2013-07-30 LAB — GLUCOSE, CAPILLARY
Glucose-Capillary: 169 mg/dL — ABNORMAL HIGH (ref 70–99)
Glucose-Capillary: 51 mg/dL — ABNORMAL LOW (ref 70–99)
Glucose-Capillary: 114 mg/dL — ABNORMAL HIGH (ref 70–99)
Glucose-Capillary: 52 mg/dL — ABNORMAL LOW (ref 70–99)

## 2013-07-30 LAB — COMPREHENSIVE METABOLIC PANEL
ALT: 13 U/L (ref 0–53)
AST: 26 U/L (ref 0–37)
Calcium: 9.1 mg/dL (ref 8.4–10.5)
Sodium: 136 mEq/L (ref 135–145)
Total Protein: 7.3 g/dL (ref 6.0–8.3)

## 2013-07-30 LAB — TROPONIN I
Troponin I: <0.3 ng/mL (ref <0.30)
Troponin I: <0.3 ng/mL (ref <0.30)

## 2013-07-30 MED ORDER — INSULIN GLARGINE 100 UNIT/ML ~~LOC~~ SOLN
40.0000 [IU] | Freq: Every morning | SUBCUTANEOUS | Status: DC
  Filled 2013-07-30 (×3): qty 0.4

## 2013-07-30 MED ORDER — SODIUM CHLORIDE 0.9 % IJ SOLN
3.0000 mL | Freq: Two times a day (BID) | INTRAMUSCULAR | Status: DC
  Administered 2013-08-02 (×2): 3 mL via INTRAVENOUS

## 2013-07-30 MED ORDER — ACETAMINOPHEN 650 MG RE SUPP
650.0000 mg | Freq: Once | RECTAL | Status: AC
  Administered 2013-07-30: 650 mg via RECTAL
  Filled 2013-07-30: qty 1

## 2013-07-30 MED ORDER — DEXTROSE 50 % IV SOLN
INTRAVENOUS | Status: AC
  Administered 2013-07-30: 50 mL
  Filled 2013-07-30: qty 50

## 2013-07-30 MED ORDER — ENOXAPARIN SODIUM 40 MG/0.4ML ~~LOC~~ SOLN
40.0000 mg | SUBCUTANEOUS | Status: DC
  Administered 2013-07-30 – 2013-08-02 (×4): 40 mg via SUBCUTANEOUS
  Filled 2013-07-30 (×4): qty 0.4

## 2013-07-30 MED ORDER — ACETAMINOPHEN 650 MG RE SUPP: 650.0000 mg | Freq: Four times a day (QID) | RECTAL | Status: DC | PRN

## 2013-07-30 MED ORDER — ALBUTEROL SULFATE (5 MG/ML) 0.5% IN NEBU
2.5000 mg | INHALATION_SOLUTION | RESPIRATORY_TRACT | Status: DC | PRN
  Administered 2013-07-31: 2.5 mg via RESPIRATORY_TRACT
  Filled 2013-07-30: qty 0.5

## 2013-07-30 MED ORDER — ONDANSETRON HCL 4 MG PO TABS: 4.0000 mg | ORAL_TABLET | Freq: Four times a day (QID) | ORAL | Status: DC | PRN

## 2013-07-30 MED ORDER — GABAPENTIN 300 MG PO CAPS
300.0000 mg | ORAL_CAPSULE | Freq: Two times a day (BID) | ORAL | Status: DC
  Administered 2013-07-30 – 2013-08-03 (×8): 300 mg via ORAL
  Filled 2013-07-30 (×8): qty 1

## 2013-07-30 MED ORDER — AZITHROMYCIN 500 MG IV SOLR
500.0000 mg | Freq: Once | INTRAVENOUS | Status: AC
  Administered 2013-07-30: 500 mg via INTRAVENOUS
  Filled 2013-07-30: qty 500

## 2013-07-30 MED ORDER — INSULIN ASPART 100 UNIT/ML ~~LOC~~ SOLN
0.0000 [IU] | Freq: Three times a day (TID) | SUBCUTANEOUS | Status: DC
  Administered 2013-07-31: 5 [IU] via SUBCUTANEOUS
  Administered 2013-07-31 – 2013-08-01 (×2): 3 [IU] via SUBCUTANEOUS
  Administered 2013-08-01: 5 [IU] via SUBCUTANEOUS
  Administered 2013-08-01: 3 [IU] via SUBCUTANEOUS
  Administered 2013-08-02: 8 [IU] via SUBCUTANEOUS
  Administered 2013-08-02 (×2): 3 [IU] via SUBCUTANEOUS
  Administered 2013-08-03: 5 [IU] via SUBCUTANEOUS

## 2013-07-30 MED ORDER — TIMOLOL MALEATE 0.25 % OP SOLN
1.0000 [drp] | Freq: Every morning | OPHTHALMIC | Status: DC
  Administered 2013-07-31 – 2013-08-03 (×4): 1 [drp] via OPHTHALMIC
  Filled 2013-07-30 (×2): qty 5

## 2013-07-30 MED ORDER — GLUCOSE 40 % PO GEL
ORAL | Status: AC
  Administered 2013-07-30: 37.5 g
  Filled 2013-07-30: qty 0.83

## 2013-07-30 MED ORDER — ONDANSETRON HCL 4 MG/2ML IJ SOLN: 4.0000 mg | Freq: Four times a day (QID) | INTRAMUSCULAR | Status: DC | PRN

## 2013-07-30 MED ORDER — SODIUM CHLORIDE 0.9 % IV BOLUS (SEPSIS)
1000.0000 mL | Freq: Once | INTRAVENOUS | Status: AC
  Administered 2013-07-30: 1000 mL via INTRAVENOUS

## 2013-07-30 MED ORDER — ASPIRIN 81 MG PO CHEW
81.0000 mg | CHEWABLE_TABLET | Freq: Every morning | ORAL | Status: DC
  Administered 2013-07-31 – 2013-08-03 (×4): 81 mg via ORAL
  Filled 2013-07-30 (×4): qty 1

## 2013-07-30 MED ORDER — DEXTROSE 5 % IV SOLN
500.0000 mg | INTRAVENOUS | Status: DC
  Administered 2013-07-31: 500 mg via INTRAVENOUS
  Filled 2013-07-30 (×2): qty 500

## 2013-07-30 MED ORDER — METOPROLOL TARTRATE 50 MG PO TABS
100.0000 mg | ORAL_TABLET | Freq: Two times a day (BID) | ORAL | Status: DC
  Administered 2013-07-30 – 2013-08-03 (×8): 100 mg via ORAL
  Filled 2013-07-30 (×8): qty 2

## 2013-07-30 MED ORDER — SIMVASTATIN 20 MG PO TABS
40.0000 mg | ORAL_TABLET | Freq: Every day | ORAL | Status: DC
  Administered 2013-07-31: 40 mg via ORAL
  Filled 2013-07-30: qty 2

## 2013-07-30 MED ORDER — SODIUM CHLORIDE 0.9 % IV SOLN
INTRAVENOUS | Status: AC
  Administered 2013-07-30: 20:00:00 via INTRAVENOUS

## 2013-07-30 MED ORDER — GLUCOSE 40 % PO GEL: 1.0000 | ORAL | Status: DC | PRN

## 2013-07-30 MED ORDER — AMLODIPINE BESYLATE 5 MG PO TABS
10.0000 mg | ORAL_TABLET | Freq: Every day | ORAL | Status: DC
  Administered 2013-07-31 – 2013-08-02 (×3): 10 mg via ORAL
  Filled 2013-07-30 (×3): qty 2

## 2013-07-30 MED ORDER — DEXTROSE 5 % IV SOLN
1.0000 g | Freq: Once | INTRAVENOUS | Status: AC
  Administered 2013-07-30: 1 g via INTRAVENOUS
  Filled 2013-07-30: qty 10

## 2013-07-30 MED ORDER — ACETAMINOPHEN 325 MG PO TABS: 650.0000 mg | ORAL_TABLET | Freq: Four times a day (QID) | ORAL | Status: DC | PRN

## 2013-07-30 MED ORDER — SODIUM CHLORIDE 0.9 % IV SOLN: INTRAVENOUS | Status: DC

## 2013-07-30 MED ORDER — LISINOPRIL 10 MG PO TABS
20.0000 mg | ORAL_TABLET | Freq: Two times a day (BID) | ORAL | Status: DC
  Administered 2013-07-30 – 2013-08-03 (×8): 20 mg via ORAL
  Filled 2013-07-30 (×8): qty 2

## 2013-07-30 MED ORDER — DEXTROSE 5 % IV SOLN
1.0000 g | INTRAVENOUS | Status: DC
  Administered 2013-07-31 – 2013-08-02 (×3): 1 g via INTRAVENOUS
  Filled 2013-07-30 (×4): qty 10

## 2013-07-30 MED ORDER — DOXAZOSIN MESYLATE 2 MG PO TABS
4.0000 mg | ORAL_TABLET | Freq: Every day | ORAL | Status: DC
  Administered 2013-07-30 – 2013-08-02 (×4): 4 mg via ORAL
  Filled 2013-07-30 (×2): qty 2
  Filled 2013-07-30 (×2): qty 1
  Filled 2013-07-30: qty 2

## 2013-07-30 NOTE — H&P (Signed)
[facility] History and Physical  Marcus Hendrix ZOX:[id number] DOB: [birth year] DOA: 07/30/2013   PCP: [name], MD  Specialists: He is followed by [name], with cardiology  Chief Complaint: Weakness, and cough since yesterday  HPI: Marcus DRUMMER Sr. is a 72 y.o. male with a past medical history of diabetes, coronary artery disease, hypertension, who was in his usual state of health yesterday when he started noticing that he had a cough, which was dry. He had shortness of breath, along with the cough, and had chest pain over his entire chest with the cough, but none otherwise. He had fever and chills. He denies any nausea, vomiting. Denies any sick contacts. Denies any recent travel. And, then this morning when he woke up he felt extremely weak. He couldn't get up or walk. Denies any focal weakness, just generalized weakness. He had been a little dizzy, but denies any syncopal episodes or falls. And, the dizziness got better as the day went on. Denies any headaches. Denies any diarrhea.  Home Medications: Prior to Admission medications   Medication Sig Start Date End Date Taking? Authorizing Provider  amLODipine (NORVASC) 10 MG tablet Take 10 mg by mouth daily.     Yes Historical Provider, MD  aspirin 81 MG tablet Take 81 mg by mouth every morning.    Yes Historical Provider, MD  doxazosin (CARDURA) 4 MG tablet Take 4 mg by mouth every evening.   Yes Historical Provider, MD  furosemide (LASIX) 40 MG tablet Take 40 mg by mouth daily.     Yes Historical Provider, MD  gabapentin (NEURONTIN) 300 MG capsule Take 300 mg by mouth 2 (two) times daily.     Yes Historical Provider, MD  insulin glargine (LANTUS) 100 UNIT/ML injection Inject 40 Units into the skin every morning.    Yes Historical Provider, MD  lisinopril (PRINIVIL,ZESTRIL) 20 MG tablet Take 1 tablet (20 mg total) by mouth 2 (two) times daily. 07/09/13  Yes [name], MD  metoprolol (LOPRESSOR) 100 MG  tablet Take 100 mg by mouth 2 (two) times daily.     Yes Historical Provider, MD  potassium chloride SA (K-DUR,KLOR-CON) 20 MEQ tablet Take 20 mEq by mouth daily.   Yes Historical Provider, MD  pravastatin (PRAVACHOL) 80 MG tablet Take 80 mg by mouth every morning. Pt. must have OV to continue refills, he is 3 months overdue. 04/15/11  Yes [name], NP  timolol (TIMOPTIC) 0.25 % ophthalmic solution Place 1 drop into both eyes every morning.    Yes Historical Provider, MD  vitamin B-12 (CYANOCOBALAMIN) 1000 MCG tablet Take 2,000 mcg by mouth daily.   Yes Historical Provider, MD    Allergies:  Allergies  Allergen Reactions  . Bidil [Isosorb Dinitrate-Hydralazine]     PASSED OUT BUT NOT SURE IF THIS WAS THE CAUSE    Past Medical History: Past Medical History  Diagnosis Date  . History of atherosclerotic cardiovascular disease   . History of myocardial infarction   . PVD (peripheral vascular disease)   . Hypertension   . Hyperlipidemia   . Diabetes mellitus   . Cerebrovascular disease   . Tubulovillous adenoma of colon   . ED (erectile dysfunction)   . Inguinal hernia     Right  . Normocytic anemia   . Glaucoma, left eye   . Peripheral neuropathy     Past Surgical History  Procedure Laterality Date  . Coronary artery bypass graft  2002    x6  .  Cholecystectomy    . Inguinal hernia repair  2004    Left    Social History:  reports that he has quit smoking. His smokeless tobacco use includes Chew. He reports that he does not drink alcohol or use illicit drugs.  Living Situation: He lives with his wife Activity Level: Usually independent with daily activities   Family History:  Family History  Problem Relation Age of Onset  . Heart attack Mother   . Stroke Father   . Diabetes Sister   . HIV Brother   . Alcohol abuse Brother   . Diabetes Sister   . 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 ENT ROS: negative Allergy and Immunology ROS: negative Hematological and Lymphatic ROS: negative Endocrine ROS: negative Respiratory ROS: as in hpi Cardiovascular ROS: as in hpi Gastrointestinal ROS: no abdominal pain, change in bowel habits, or black or bloody stools Genito-Urinary ROS: no dysuria, trouble voiding, or hematuria Musculoskeletal ROS: negative Neurological ROS: no TIA or stroke symptoms Dermatological ROS: negative  Physical Examination  Filed Vitals:   07/30/13 1645 07/30/13 1730 07/30/13 1800 07/30/13 1828  BP: 175/54 118/79 163/68 161/56  Pulse: 72 69 66 65  Temp:  99.3 F (37.4 C)    TempSrc:    Oral  Resp: 16 19 18 18   SpO2: 94% 94% 94% 92%  Initial temp was 102F.  General appearance: alert, cooperative, appears stated age and no distress Head: Normocephalic, without obvious abnormality, atraumatic Eyes: conjunctivae/corneas clear. PERRL, EOM's intact.  Throat: Slightly dry Mucous membranes Neck: no adenopathy, no carotid bruit, no JVD, supple, symmetrical, trachea midline and thyroid not enlarged, symmetric, no tenderness/mass/nodules Resp: Diminished air entry at the basis without any definite crackles or wheezing. Cardio: S1-S2 is normal. Regular. Systolic murmur appreciated over the precordium. No S3, S4. No rubs or bruits. Scar from previous surgery noted on the chest. GI: soft, non-tender; bowel sounds normal; no masses,  no organomegaly Extremities: extremities normal, atraumatic, no cyanosis or edema Pulses: 2+ and symmetric Skin: Skin color, texture, turgor normal. No rashes or lesions Lymph nodes: Cervical, supraclavicular, and axillary nodes normal. Neurologic: Alert and oriented X 3, normal strength and tone. Normal symmetric reflexes. Normal coordination  Laboratory Data: Results for orders placed during the hospital encounter of 07/30/13 (from the past 48 hour(s))   GLUCOSE, CAPILLARY     Status: Abnormal   Collection Time    07/30/13  4:09 PM      Result Value Range   Glucose-Capillary 51 (*) 70 - 99 mg/dL  GLUCOSE, CAPILLARY     Status: Abnormal   Collection Time    07/30/13  4:26 PM      Result Value Range   Glucose-Capillary 169 (*) 70 - 99 mg/dL  COMPREHENSIVE METABOLIC PANEL     Status: Abnormal   Collection Time    07/30/13  5:14 PM      Result Value Range   Sodium 136  135 - 145 mEq/L   Potassium 3.6  3.5 - 5.1 mEq/L   Chloride 99  96 - 112 mEq/L   CO2 27  19 - 32 mEq/L   Glucose, Bld 114 (*) 70 - 99 mg/dL   BUN 13  6 - 23 mg/dL   Creatinine, Ser 4.09  0.50 - 1.35 mg/dL   Calcium 9.1  8.4 - 81.1 mg/dL  Total Protein 7.3  6.0 - 8.3 g/dL   Albumin 3.7  3.5 - 5.2 g/dL   AST 26  0 - 37 U/L   ALT 13  0 - 53 U/L   Alkaline Phosphatase 85  39 - 117 U/L   Total Bilirubin 0.8  0.3 - 1.2 mg/dL   GFR calc non Af Amer 64 (*) >90 mL/min   GFR calc Af Amer 74 (*) >90 mL/min   Comment: (NOTE)     The eGFR has been calculated using the CKD EPI equation.     This calculation has not been validated in all clinical situations.     eGFR's persistently <90 mL/min signify possible Chronic Kidney     Disease.  CBC WITH DIFFERENTIAL     Status: Abnormal   Collection Time    07/30/13  5:14 PM      Result Value Range   WBC 9.2  4.0 - 10.5 K/uL   RBC 4.05 (*) 4.22 - 5.81 MIL/uL   Hemoglobin 12.3 (*) 13.0 - 17.0 g/dL   HCT 16.1 (*) 09.6 - 04.5 %   MCV 92.3  78.0 - 100.0 fL   MCH 30.4  26.0 - 34.0 pg   MCHC 32.9  30.0 - 36.0 g/dL   RDW 40.9  81.1 - 91.4 %   Platelets 166  150 - 400 K/uL   Neutrophils Relative % 77  43 - 77 %   Neutro Abs 7.0  1.7 - 7.7 K/uL   Lymphocytes Relative 17  12 - 46 %   Lymphs Abs 1.6  0.7 - 4.0 K/uL   Monocytes Relative 5  3 - 12 %   Monocytes Absolute 0.4  0.1 - 1.0 K/uL   Eosinophils Relative 2  0 - 5 %   Eosinophils Absolute 0.2  0.0 - 0.7 K/uL   Basophils Relative 0  0 - 1 %   Basophils Absolute 0.0  0.0 -  0.1 K/uL    Radiology Reports: Dg Chest Port 1 View  07/30/2013   *RADIOLOGY REPORT*  Clinical Data: 72 year old male shortness of breath nonproductive cough.  Fever.  PORTABLE CHEST - 1 VIEW  Comparison: 06/25/2013 and earlier.  Findings: Portable AP upright view at 1655 hours.  Chronic cardiomegaly. Stable mediastinal contours, chronic left hilar calcified nodes.  Lower lung volumes.  Increased pulmonary vascular congestion, no overt edema.  Surgical clips in the left axilla and near the left thoracic inlet are stable. Visualized tracheal air column is within normal limits.  No pneumothorax.  No definite pleural effusion.  Increased streaky opacity at the left lung base.  IMPRESSION: 1.  Lower lung volumes.  Increased pulmonary vascular congestion. 2.  Increased streaky opacity at the left lung base, with main considerations of atelectasis or infection.   Original Report Authenticated By: Erskine Speed, M.D.    Electrocardiogram: Sinus rhythm with a first degree AV block at 75 beats per minute. Normal axis. Intervals otherwise normal. Nonspecific T-wave changes in lateral leads. No acute ST changes are noted. The T wave changes appear to be new compared to EKG from 2013.  Problem List  Principal Problem:   Community acquired pneumonia Active Problems:   MYOCARDIAL INFARCTION, HX OF   ATHEROSCLEROTIC CARDIOVASCULAR DISEASE   DM type 2 (diabetes mellitus, type 2)   HTN (hypertension), benign   Assessment: This is a 72 year old, African American male, who presents with weakness, cough, and was found to have a fever in the emergency department and was  found to have pneumonia on chest x ray. Denies any recent hospitalizations. This is most likely community-acquired pneumonia. He also had some incontinence of urine when he arrived at the ED, and a UA is still pending  Plan: #1 community-acquired pneumonia: He'll be continued on ceftriaxone and azithromycin. Oxygen will be provided as needed.  #2  generalized weakness: Does not have any focal neurological deficits. His weakness is most likely due to his infectious process. Physical and occupational therapy will be consulted. UA is pending.  #3 history of coronary artery disease: He does have some subtle EKG changes, which could be new. Denies any anginal chest pains. We will check a troponin. Monitor him on telemetry and repeat EKG in the morning. Continue with his aspirin. Continue with beta blocker and ACE inhibitors from the morning.  #4 history of diabetes mellitus, type II: Monitor CBGs. Cover him with sliding scale coverage and continue with his home regimen. HbA1c will be checked.  #5 history of hypertension: Monitor blood pressure closely. Continue with his antihypertensive regimen.  #6 mild dehydration: Will be given gentle IV hydration.   DVT Prophylaxis: Lovenox Code Status: Full code Family Communication: Discussed with the patient  Disposition Plan: Admit to telemetry. [name] will assume care in the morning.  Further management decisions will depend on results of further testing and patient's response to treatment.  [facility]  [facility] Pager [phone]  If 7PM-7AM, please contact night-coverage [email or web address] Password [facility]  07/30/2013, 8:11 PM

## 2013-07-30 NOTE — ED Notes (Signed)
Pt incontinent of urine on arrival to ED. cbg 84 by EMS

## 2013-07-30 NOTE — ED Provider Notes (Signed)
CSN: [id number]     Arrival date & time 07/30/13  1551 History  This chart was scribed for Donnetta Hutching, MD by Shari Heritage, ED Scribe. The patient was seen in room APA19/APA19. Patient's care was started at 1644.   First MD Initiated Contact with Patient 07/30/13 1644     Chief Complaint  Patient presents with  . Weakness     The history is provided by the patient. No language interpreter was used.    HPI Comments: Marcus FLECK Sr. is a 72 y.o. male with history of atherosclerotic CVD, MI, PVD, hypertension, diabetes, and hyperlipidemia who presents to the Emergency Department complaining of constant, moderate to severe, generalized weakness onset this morning upon waking. He states that he did not feel poorly before bed last night Patient says that he was unable to walk this morning after trying to get out of bed at abut 5 AM. He also reports that he developed a cough 1 week ago and has shortness of breath. He denies difficulty urinating, abdominal pain, chest pain or ear pain. He is a former smoker.  Past Medical History  Diagnosis Date  . History of atherosclerotic cardiovascular disease   . History of myocardial infarction   . PVD (peripheral vascular disease)   . Hypertension   . Hyperlipidemia   . Diabetes mellitus   . Cerebrovascular disease   . Tubulovillous adenoma of colon   . ED (erectile dysfunction)   . Inguinal hernia     Right  . Normocytic anemia   . Glaucoma, left eye   . Peripheral neuropathy    Past Surgical History  Procedure Laterality Date  . Coronary artery bypass graft  2002    x6  . Cholecystectomy    . Inguinal hernia repair  2004    Left   Family History  Problem Relation Age of Onset  . Heart attack Mother   . Stroke Father   . Diabetes Sister   . HIV Brother   . Alcohol abuse Brother   . Diabetes Sister   . Heart attack Son    History  Substance Use Topics  . Smoking status: Former Games developer  . Smokeless tobacco: Not on file  .  Alcohol Use: No    Review of Systems A complete 10 system review of systems was obtained and all systems are negative except as noted in the HPI and PMH.   Allergies  Bidil  Home Medications   Current Outpatient Rx  Name  Route  Sig  Dispense  Refill  . amLODipine (NORVASC) 10 MG tablet   Oral   Take 10 mg by mouth daily.           [name] aspirin 81 MG tablet   Oral   Take 81 mg by mouth daily.           [name] doxazosin (CARDURA) 4 MG tablet   Oral   Take 4 mg by mouth every evening.         . furosemide (LASIX) 40 MG tablet   Oral   Take 40 mg by mouth daily.           [name] gabapentin (NEURONTIN) 300 MG capsule   Oral   Take 300 mg by mouth 2 (two) times daily.           [name] glucose blood test strip   Other   1 each by Other route as directed. Use as instructed          .  insulin glargine (LANTUS) 100 UNIT/ML injection   Subcutaneous   Inject 70 Units into the skin every morning.          [name] lisinopril (PRINIVIL,ZESTRIL) 20 MG tablet   Oral   Take 1 tablet (20 mg total) by mouth 2 (two) times daily.   180 tablet   1   . metoprolol (LOPRESSOR) 100 MG tablet   Oral   Take 100 mg by mouth 2 (two) times daily.           . potassium chloride SA (K-DUR,KLOR-CON) 20 MEQ tablet   Oral   Take 20 mEq by mouth daily.         . pravastatin (PRAVACHOL) 80 MG tablet   Oral   Take 80 mg by mouth every morning. Pt. must have OV to continue refills, he is 3 months overdue.         . timolol (TIMOPTIC) 0.25 % ophthalmic solution   Both Eyes   Place 1 drop into both eyes every morning.          . vitamin B-12 (CYANOCOBALAMIN) 1000 MCG tablet   Oral   Take 2,000 mcg by mouth daily.          Triage Vitals: BP 175/54  Pulse 72  Temp(Src) 102.7 F (39.3 C) (Rectal)  Resp 16  SpO2 94% Physical Exam  Constitutional: He appears well-developed and well-nourished.  HENT:  Head: Normocephalic and atraumatic.  Eyes: Conjunctivae and EOM are normal.  Neck:  Normal range of motion. Neck supple.  Cardiovascular: Normal rate and regular rhythm.   Pulmonary/Chest: Effort normal.  Abdominal: Soft. There is no tenderness.  Neurological: He is alert.  Lucid. Responsive and answering questions appropriately.  Skin: Skin is warm and dry.  Psychiatric: He has a normal mood and affect.    ED Course   Procedures (including critical care time)  COORDINATION OF CARE: 4:44 PM- Patient presents with generalized weakness. Upon arrival, temperature was 102.7. He also reports a cough for the past week. Suspect that he has contracted pneumonia. Will likely admit patient for treatment. Patient and family (wife and grandson) informed of current plan for treatment and evaluation and agrees with plan at this time.     Labs Reviewed  GLUCOSE, CAPILLARY - Abnormal; Notable for the following:    Glucose-Capillary 51 (*)    All other components within normal limits  GLUCOSE, CAPILLARY - Abnormal; Notable for the following:    Glucose-Capillary 169 (*)    All other components within normal limits  CULTURE, BLOOD (ROUTINE X 2)  CULTURE, BLOOD (ROUTINE X 2)  COMPREHENSIVE METABOLIC PANEL  CBC WITH DIFFERENTIAL  URINALYSIS, ROUTINE W REFLEX MICROSCOPIC    Dg Chest Port 1 View  07/30/2013   *RADIOLOGY REPORT*  Clinical Data: 72 year old male shortness of breath nonproductive cough.  Fever.  PORTABLE CHEST - 1 VIEW  Comparison: 06/25/2013 and earlier.  Findings: Portable AP upright view at 1655 hours.  Chronic cardiomegaly. Stable mediastinal contours, chronic left hilar calcified nodes.  Lower lung volumes.  Increased pulmonary vascular congestion, no overt edema.  Surgical clips in the left axilla and near the left thoracic inlet are stable. Visualized tracheal air column is within normal limits.  No pneumothorax.  No definite pleural effusion.  Increased streaky opacity at the left lung base.  IMPRESSION: 1.  Lower lung volumes.  Increased pulmonary vascular  congestion. 2.  Increased streaky opacity at the left lung base, with main considerations of atelectasis or infection.  Original Report Authenticated By: Erskine Speed, M.D.   No diagnosis found.    Date: 07/30/2013  Rate: 75  Rhythm: normal sinus rhythm  QRS Axis: normal  Intervals: normal  ST/T Wave abnormalities: normal  Conduction Disutrbances: 1st degree av block;  LBBB  Narrative Interpretation: unremarkable  Dg Chest Port 1 View  07/30/2013   *RADIOLOGY REPORT*  Clinical Data: 72 year old male shortness of breath nonproductive cough.  Fever.  PORTABLE CHEST - 1 VIEW  Comparison: 06/25/2013 and earlier.  Findings: Portable AP upright view at 1655 hours.  Chronic cardiomegaly. Stable mediastinal contours, chronic left hilar calcified nodes.  Lower lung volumes.  Increased pulmonary vascular congestion, no overt edema.  Surgical clips in the left axilla and near the left thoracic inlet are stable. Visualized tracheal air column is within normal limits.  No pneumothorax.  No definite pleural effusion.  Increased streaky opacity at the left lung base.  IMPRESSION: 1.  Lower lung volumes.  Increased pulmonary vascular congestion. 2.  Increased streaky opacity at the left lung base, with main considerations of atelectasis or infection.   Original Report Authenticated By: Erskine Speed, M.D.    MDM  Patient presents with profound weakness, cough, inability to walk.   Chest x-ray shows early left basilar pneumonia.  Rx IV Rocephin, IV Zithromax. Admit to general medicine     I personally performed the services described in this documentation, which was scribed in my presence. The recorded information has been reviewed and is accurate.    Donnetta Hutching, MD 07/30/13 [phone]

## 2013-07-30 NOTE — ED Notes (Signed)
Pt states he went to bed at 2200 last night and was fine. Woke up at 0500 this morning weak and could not walk. Denies n/v/d.

## 2013-07-31 LAB — CBC
HCT: 35.5 % — ABNORMAL LOW (ref 39.0–52.0)
Hemoglobin: 11.3 g/dL — ABNORMAL LOW (ref 13.0–17.0)
MCV: 93.4 fL (ref 78.0–100.0)
RBC: 3.8 MIL/uL — ABNORMAL LOW (ref 4.22–5.81)
RDW: 12.7 % (ref 11.5–15.5)
WBC: 8 10*3/uL (ref 4.0–10.5)

## 2013-07-31 LAB — STREP PNEUMONIAE URINARY ANTIGEN: Strep Pneumo Urinary Antigen: NEGATIVE

## 2013-07-31 LAB — GLUCOSE, CAPILLARY

## 2013-07-31 LAB — COMPREHENSIVE METABOLIC PANEL
Albumin: 3.3 g/dL — ABNORMAL LOW (ref 3.5–5.2)
Alkaline Phosphatase: 74 U/L (ref 39–117)
BUN: 15 mg/dL (ref 6–23)
Potassium: 3.5 mEq/L (ref 3.5–5.1)
Total Protein: 6.7 g/dL (ref 6.0–8.3)

## 2013-07-31 LAB — URINALYSIS, ROUTINE W REFLEX MICROSCOPIC
Bilirubin Urine: NEGATIVE
Glucose, UA: NEGATIVE mg/dL
Specific Gravity, Urine: >1.03 — ABNORMAL HIGH (ref 1.005–1.030)
Urobilinogen, UA: 0.2 mg/dL (ref 0.0–1.0)

## 2013-07-31 LAB — URINE MICROSCOPIC-ADD ON

## 2013-07-31 MED ORDER — SODIUM CHLORIDE 0.9 % IV SOLN
INTRAVENOUS | Status: DC
  Administered 2013-07-31: 10:00:00 via INTRAVENOUS

## 2013-07-31 NOTE — Progress Notes (Signed)
Patient CBG low, Dr. Renard Matter notified that Lantus was held. New orders to discontinue Lantus. Notified Dr. Renard Matter of IV fluids expired, orders to have fluids run at [facility].

## 2013-07-31 NOTE — Progress Notes (Signed)
ANTIBIOTIC CONSULT NOTE - INITIAL  Pharmacy Consult for renal dose antibiotics Indication: pneumonia  Allergies  Allergen Reactions  . Bidil [Isosorb Dinitrate-Hydralazine]     PASSED OUT BUT NOT SURE IF THIS WAS THE CAUSE    Patient Measurements: Height: 5\' 6"  (167.6 cm) Weight: 201 lb 15.1 oz (91.6 kg) IBW/kg (Calculated) : 63.8  Vital Signs: Temp: 98.8 F (37.1 C) (08/16 0617) Temp src: Oral (08/16 0617) BP: 133/39 mmHg (08/16 0617) Pulse Rate: 60 (08/16 0617) Intake/Output from previous day: 08/15 0701 - 08/16 0700 In: 876.7 [I.V.:876.7] Out: -  Intake/Output from this shift:    Labs:  Recent Labs  07/30/13 1714 07/31/13 0540  WBC 9.2 8.0  HGB 12.3* 11.3*  PLT 166 165  CREATININE 1.12 1.14   Estimated Creatinine Clearance: 62.1 ml/min (by C-G formula based on Cr of 1.14). No results found for this basename: VANCOTROUGH, Leodis Binet, VANCORANDOM, GENTTROUGH, GENTPEAK, GENTRANDOM, TOBRATROUGH, TOBRAPEAK, TOBRARND, AMIKACINPEAK, AMIKACINTROU, AMIKACIN,  in the last 72 hours   Microbiology: Recent Results (from the past 720 hour(s))  CULTURE, BLOOD (ROUTINE X 2)     Status: None   Collection Time    07/30/13  5:15 PM      Result Value Range Status   Specimen Description BLOOD RIGHT HAND   Final   Special Requests BOTTLES DRAWN AEROBIC AND ANAEROBIC 6CC   Final   Culture NO GROWTH 1 DAY   Final   Report Status PENDING   Incomplete  CULTURE, BLOOD (ROUTINE X 2)     Status: None   Collection Time    07/30/13  5:20 PM      Result Value Range Status   Specimen Description BLOOD RIGHT ANTECUBITAL   Final   Special Requests BOTTLES DRAWN AEROBIC AND ANAEROBIC 6CC   Final   Culture NO GROWTH 1 DAY   Final   Report Status PENDING   Incomplete    Medical History: Past Medical History  Diagnosis Date  . History of atherosclerotic cardiovascular disease   . History of myocardial infarction   . PVD (peripheral vascular disease)   . Hypertension   .  Hyperlipidemia   . Diabetes mellitus   . Cerebrovascular disease   . Tubulovillous adenoma of colon   . ED (erectile dysfunction)   . Inguinal hernia     Right  . Normocytic anemia   . Glaucoma, left eye   . Peripheral neuropathy     Medications:  Scheduled:  . amLODipine  10 mg Oral Daily  . aspirin  81 mg Oral q morning - 10a  . azithromycin  500 mg Intravenous Q24H  . cefTRIAXone (ROCEPHIN)  IV  1 g Intravenous Q24H  . doxazosin  4 mg Oral QHS  . enoxaparin (LOVENOX) injection  40 mg Subcutaneous Q24H  . gabapentin  300 mg Oral BID  . insulin aspart  0-15 Units Subcutaneous TID WC  . insulin glargine  40 Units Subcutaneous q morning - 10a  . lisinopril  20 mg Oral BID  . metoprolol  100 mg Oral BID  . simvastatin  40 mg Oral q1800  . sodium chloride  3 mL Intravenous Q12H  . timolol  1 drop Both Eyes q morning - 10a   Assessment: 72yo male admitted with CAP.  Pt is on Rocephin and Zithromax. No renal adjustment is needed.  Goal of Therapy:  Eradicate infection.  Plan:  Continue current Rx Switch Zithromax to PO when appropriate.  Valrie Hart A 07/31/2013,8:49 AM

## 2013-07-31 NOTE — Progress Notes (Addendum)
Patient loss of IV access, multiple RNs attempt to access new IV without success. Dr. Renard Matter notified due to patient on IV antibiotic. Dr. Renard Matter wants night supervisor or next available ED nurse to attempt an IV site. Patient CBG climbing up. Dr. Renard Matter notified of Lantus being discontinued. Order to not start Lantus back, but to continue sliding scale.

## 2013-07-31 NOTE — Progress Notes (Signed)
NAMECHEO, SELVEY NO.:  [id number]  MEDICAL RECORD NO.:  [id number]  LOCATION:  A339                          FACILITY:  [facility]  PHYSICIAN:  Shana Younge G. Renard Matter, MD   DATE OF BIRTH:  [birth year]  DATE OF PROCEDURE: DATE OF DISCHARGE:                                PROGRESS NOTE   SUBJECTIVE:  This patient was admitted with community-acquired pneumonia.  He does have diabetes type 2, hypertension, previous MI.  A chest x-ray showed increased pulmonary vascular congestion, increased streaky opacity at left lung base.  The patient was admitted and started on IV antibiotics, IV Rocephin and Zithromax.  OBJECTIVE:  VITAL SIGNS:  Blood pressure 133/39, respirations 20, pulse 62, temperature 98.8. HEENT:  Eyes, PERRLA.  TM negative.  Oropharynx benign. NECK:  Supple.  No JVD or thyroid abnormalities. HEART:  Regular rhythm.  No cardiomegaly. LUNGS:  Diminished breath sounds, rales at bases. ABDOMEN:  No palpable organs or masses. EXTREMITIES:  Free of edema.  ASSESSMENT: 1. The patient was admitted with community-acquired pneumonia.  Plan     is to get continue IV Rocephin and Zithromax. 2. History of coronary artery disease. 3. Diabetes mellitus type 2. 4. Mild dehydration.  Continue current regimen.  Repeat EKG and     troponin.     Delores Thelen G. Renard Matter, MD     AGM/MEDQ  D:  07/31/2013  T:  07/31/2013  Job:  [id number]

## 2013-08-01 LAB — GLUCOSE, CAPILLARY
Glucose-Capillary: 234 mg/dL — ABNORMAL HIGH (ref 70–99)
Glucose-Capillary: 193 mg/dL — ABNORMAL HIGH (ref 70–99)

## 2013-08-01 MED ORDER — AZITHROMYCIN 250 MG PO TABS
500.0000 mg | ORAL_TABLET | Freq: Every day | ORAL | Status: DC
  Administered 2013-08-01 – 2013-08-03 (×3): 500 mg via ORAL
  Filled 2013-08-01 (×3): qty 2

## 2013-08-01 MED ORDER — ATORVASTATIN CALCIUM 20 MG PO TABS
20.0000 mg | ORAL_TABLET | Freq: Every day | ORAL | Status: DC
  Administered 2013-08-01 – 2013-08-02 (×2): 20 mg via ORAL
  Filled 2013-08-01 (×2): qty 1

## 2013-08-01 NOTE — Progress Notes (Signed)
NAMELONIE, NEWSHAM NO.:  [id number]  MEDICAL RECORD NO.:  [id number]  LOCATION:  A339                          FACILITY:  [facility]  PHYSICIAN:  Doniven Vanpatten G. Renard Matter, MD   DATE OF BIRTH:  [birth year]  DATE OF PROCEDURE: DATE OF DISCHARGE:                                PROGRESS NOTE   SUBJECTIVE:  This patient has community-acquired pneumonia, diabetes mellitus type 2, hypertension, previous MI.  He has had an increase in pulmonary vascular congestion by chest x-ray.  He feels some better but still has slight cough and chest discomfort.  OBJECTIVE:  VITAL SIGNS:  Blood pressure 126/85, respirations 20, pulse 54, temp 98.7. HEENT:  Eyes, PERRLA.  TM negative.  Oropharynx benign. NECK:  Supple.  No JVD or thyroid abnormalities. HEART:  Regular rhythm.  No murmurs.  No cardiomegaly. LUNGS:  Diminished breath sounds with occasional rales heard at the base. ABDOMEN:  No palpable organs or masses. EXTREMITIES:  Free of edema.  ASSESSMENT: 1. The patient was admitted with community-acquired pneumonia.  Plan     to continue IV Rocephin and Zithromax. 2. History of coronary artery disease. 3. Diabetes mellitus type 2. 4. Mild dehydration.  Continue current regimen.     Genevive Printup G. Renard Matter, MD     AGM/MEDQ  D:  08/01/2013  T:  08/01/2013  Job:  [id number]

## 2013-08-01 NOTE — Progress Notes (Signed)
PHARMACIST - PHYSICIAN COMMUNICATION DR:   Renard Matter CONCERNING: Antibiotic IV to Oral Route Change Policy  RECOMMENDATION: This patient is receiving Zithromax by the intravenous route.  Based on criteria approved by the Pharmacy and Therapeutics Committee, the antibiotic(s) is/are being converted to the equivalent oral dose form(s).  DESCRIPTION: These criteria include:  Patient being treated for a respiratory tract infection, urinary tract infection, cellulitis or clostridium difficile associated diarrhea if on metronidazole  The patient is not neutropenic and does not exhibit a GI malabsorption state  The patient is eating (either orally or via tube) and/or has been taking other orally administered medications for a least 24 hours  The patient is improving clinically and has a Tmax < 100.5  If you have questions about this conversion, please contact the Pharmacy Department  [x]   [phone] )  [facility] []   [phone] )  [facility]  []   [phone] )  [facility] []   [phone] )  [facility]   S. Margo Aye, PharmD

## 2013-08-02 LAB — GLUCOSE, CAPILLARY: Glucose-Capillary: 255 mg/dL — ABNORMAL HIGH (ref 70–99)

## 2013-08-02 LAB — LEGIONELLA ANTIGEN, URINE

## 2013-08-02 NOTE — Plan of Care (Signed)
Problem: Phase I Progression Outcomes Goal: Hemodynamically stable Educated pt as to what Dr. Consider stable in order to be able to DC

## 2013-08-02 NOTE — Progress Notes (Signed)
Inpatient Diabetes Program Recommendations  AACE/ADA: New Consensus Statement on Inpatient Glycemic Control (2013)  Target Ranges:  Prepandial:   less than 140 mg/dL      Peak postprandial:   less than 180 mg/dL (1-2 hours)      Critically ill patients:  140 - 180 mg/dL   Results for KNIGHT, OELKERS SR. (MRN [id number]) as of 08/02/2013 11:28  Ref. Range 08/01/2013 07:38 08/01/2013 11:31 08/01/2013 16:31 08/01/2013 21:31 08/02/2013 07:15  Glucose-Capillary Latest Range: 70-99 mg/dL 409 (H) 811 (H) 914 (H) 236 (H) 186 (H)    Inpatient Diabetes Program Recommendations Insulin - Basal: Please consider ordering low dose basal insulin; recommend starting with Lantus 5 units daily. Correction (SSI): Please consider ordering Novolog bedtime correction scale.  Note: Patient has a history of diabetes and takes Lantus 40 units QAM at home for diabetes management.  Currently, patient is ordered to receive Novolog 0-15 units AC for inpatient glycemic control.  Blood glucose has ranged from 186-236 mg/dl over the past 24 hours.  Patient has not received any basal insulin since being admitted to the hospital.  Please consider ordering low dose basal insulin; recommend starting with Lantus 5 units daily and adjust accordingly.  Also, please consider ordering Novolog bedtime correction scale. Will continue to follow.  Thanks, Orlando Penner, RN, MSN, CCRN Diabetes Coordinator Inpatient Diabetes Program [phone]

## 2013-08-02 NOTE — Progress Notes (Signed)
UR Chart Review Completed  

## 2013-08-02 NOTE — Care Management Note (Signed)
    Page 1 of 1   08/03/2013     11:02:59 AM   CARE MANAGEMENT NOTE 08/03/2013  Patient:  Marcus Hendrix, Marcus Hendrix   Account Number:  [id number]  Date Initiated:  08/02/2013  Documentation initiated by:  Rosemary Holms  Subjective/Objective Assessment:   PTA, pt lived at home with his spouse. No needs identified. Plans on returning home at DC     Action/Plan:   Anticipated DC Date:  08/03/2013   Anticipated DC Plan:  HOME/SELF CARE      DC Planning Services  CM consult      Choice offered to / List presented to:             Status of service:  Completed, signed off Medicare Important Message given?   (If response is "NO", the following Medicare IM given date fields will be blank) Date Medicare IM given:   Date Additional Medicare IM given:    Discharge Disposition:  HOME/SELF CARE  Per UR Regulation:    If discussed at Long Length of Stay Meetings, dates discussed:    Comments:  08/02/13 Rosemary Holms RN BSN CM

## 2013-08-02 NOTE — Progress Notes (Signed)
NAMEVERNOR, MONNIG NO.:  [id number]  MEDICAL RECORD NO.:  [id number]  LOCATION:  A339                          FACILITY:  [facility]  PHYSICIAN:  Mila Homer. Sudie Bailey, M.D.DATE OF BIRTH:  1941-02-25  DATE OF PROCEDURE: DATE OF DISCHARGE:                                PROGRESS NOTE   SUBJECTIVE:  The patient was admitted to the hospital with community- acquired pneumonia 3 days ago.  He was so weak that he could hardly walk at that time.  He feels a good deal better now.  OBJECTIVE:  GENERAL:  He is sitting up in a chair.  His speech is normal.  His sensorium is intact.  His affect is good. LUNGS:  Actually were clear throughout today, and he is moving air well. HEART:  Has a regular rhythm and rate of about 80. VITAL SIGNS:  Temperature is 98.9 and respiratory rate is 20.  ASSESSMENT: 1. Community-acquired pneumonia. 2. Type 2 diabetes. 3. Dehydration, cleared. 4. Generalized weakness, secondary to pneumonia. 5. Coronary artery disease, status post myocardial infarction. 6. Benign essential hypertension.  PLAN:  Continue azithromycin and ceftriaxone.  We will discontinue IV today, switch to a saline lock and see if physical therapy can ambulate him, our plan is to discharge home in 1-2 days.     Mila Homer. Sudie Bailey, M.D.     SDK/MEDQ  D:  08/02/2013  T:  08/02/2013  Job:  [id number]

## 2013-08-02 NOTE — Evaluation (Signed)
Occupational Therapy Screen Patient Details Name: Marcus VERDUN Sr. MRN: [id number] DOB: [birth year] Today's Date: 08/02/2013 Time: 4098-1191 OT Time Calculation (min): 5 min  OT Assessment / Plan / Recommendation     Clinical Impression   OT orders received. Patient's chart reviewed. Patient participated in a occupational therapy screen and is functioning at independent level. Patient states that he feels almost back to normal with the exception of a cough. At this time, patient does not require OT services; will sign off.     OT Assessment  Patient does not need any further OT services    Follow Up Recommendations  No OT follow up    Barriers to Discharge    None  Equipment Recommendations  None recommended by OT          Precautions / Restrictions Precautions Precautions: None   Pertinent Vitals/Pain No complaints.    ADL  Lower Body Dressing: Performed;Independent Where Assessed - Lower Body Dressing: Unsupported sitting Toilet Transfer: Performed;Independent Toilet Transfer Method: Stand Wellsite geologist:  (to bed then back to recliner) Transfers/Ambulation Related to ADLs: Patient transfers independently without AD.      OT Goals(Current goals can be found in the care plan section)  1 time visit. No goals.  Visit Information  Last OT Received On: 08/02/13 Assistance Needed: +1       Prior Functioning     Home Living Family/patient expects to be discharged to:: Private residence Living Arrangements: Spouse/significant other Available Help at Discharge: Family Type of Home: House Home Access: Stairs to enter Secretary/administrator of Steps: 1 Home Layout: One level Home Equipment: None Prior Function Level of Independence: Independent Communication Communication: No difficulties Dominant Hand: Right         Vision/Perception Vision - History Baseline Vision: Wears glasses all the time Patient Visual Report: No change from  baseline   Cognition  Cognition Arousal/Alertness: Awake/alert Behavior During Therapy: WFL for tasks assessed/performed Overall Cognitive Status: Within Functional Limits for tasks assessed    Extremity/Trunk Assessment Upper Extremity Assessment Upper Extremity Assessment: Overall WFL for tasks assessed Lower Extremity Assessment Lower Extremity Assessment: Defer to PT evaluation     Mobility Transfers Transfers: Sit to Stand;Stand to Sit Sit to Stand: 7: Independent;From chair/3-in-1;With upper extremity assist Stand to Sit: 7: Independent;To chair/3-in-1;With upper extremity assist           End of Session OT - End of Session Activity Tolerance: Patient tolerated treatment well Patient left: in chair;with chair alarm set;with call bell/phone within reach    [facility], OTR/L,CBIS   08/02/2013, 9:11 AM

## 2013-08-02 NOTE — Evaluation (Signed)
Physical Therapy Evaluation Patient Details Name: Marcus Hendrix. MRN: [id number] DOB: [birth year] Today's Date: 08/02/2013 Time: 1478-2956 PT Time Calculation (min): 26 min  PT Assessment / Plan / Recommendation History of Present Illness  Pt is admitted from home for tx of pneumonia with weakness.  He is normally independent at home and lives with his wife.  Clinical Impression   Pt was seen for evaluation and found to be at prior functional level.  His O2 sat was checked on RA and found to be 92% after gait.    PT Assessment  Patent does not need any further PT services    Follow Up Recommendations  No PT follow up    Does the patient have the potential to tolerate intense rehabilitation      Barriers to Discharge        Equipment Recommendations  None recommended by PT    Recommendations for Other Services     Frequency      Precautions / Restrictions Precautions Precautions: None Restrictions Weight Bearing Restrictions: No   Pertinent Vitals/Pain       Mobility  Bed Mobility Bed Mobility: Not assessed Transfers Sit to Stand: 7: Independent Stand to Sit: 7: Independent Ambulation/Gait Ambulation/Gait Assistance: 6: Modified independent (Device/Increase time) Ambulation Distance (Feet): 300 Feet Assistive device: None Gait Pattern: Within Functional Limits Gait velocity: slow but stable Stairs: No Wheelchair Mobility Wheelchair Mobility: No    Exercises     PT Diagnosis:    PT Problem List:   PT Treatment Interventions:       PT Goals(Current goals can be found in the care plan section) Acute Rehab PT Goals PT Goal Formulation: No goals set, d/c therapy  Visit Information  Last PT Received On: 08/02/13 History of Present Illness: Pt is admitted from home for tx of pneumonia with weakness.  He is normally independent at home and lives with his wife.       Prior Functioning       Cognition       Extremity/Trunk Assessment Lower  Extremity Assessment Lower Extremity Assessment: Overall WFL for tasks assessed   Balance Balance Balance Assessed: No  End of Session PT - End of Session Equipment Utilized During Treatment: Gait belt Activity Tolerance: Patient tolerated treatment well Patient left: in chair;with call bell/phone within reach  GP     Myrlene Broker L 08/02/2013, 2:38 PM

## 2013-08-03 LAB — GLUCOSE, CAPILLARY: Glucose-Capillary: 201 mg/dL — ABNORMAL HIGH (ref 70–99)

## 2013-08-03 MED ORDER — AZITHROMYCIN 250 MG PO TABS: 250.0000 mg | ORAL_TABLET | Freq: Every day | ORAL | Status: DC

## 2013-08-03 MED ORDER — CEFUROXIME AXETIL 250 MG PO TABS: 250.0000 mg | ORAL_TABLET | Freq: Two times a day (BID) | ORAL | Status: DC

## 2013-08-03 NOTE — Progress Notes (Signed)
Pt is to be discharged home today. Pt is in NAD, IV is out, all paperwork has been reviewed/discussed with patient, and there are no questions/concerns at this time. Assessment is unchanged from this morning. Pt is to be accompanied downstairs by staff and family via wheelchair.  

## 2013-08-03 NOTE — Discharge Summary (Signed)
NAMEGAUGE, WINSKI NO.:  [id number]  MEDICAL RECORD NO.:  [id number]  LOCATION:  A339                          FACILITY:  [facility]  PHYSICIAN:  Mila Homer. Sudie Bailey, M.D.DATE OF BIRTH:  1941/03/27  DATE OF ADMISSION:  07/30/2013 DATE OF DISCHARGE:  LH                              DISCHARGE SUMMARY   This 72 year old was admitted to the hospital with what turned out to be community-acquired pneumonia.  He had a benign 5 day hospitalization extending from August 15 to August 03, 2013.  Vital signs remained stable.  His admission white cell count was 9200, rechecked 8000 and his hemoglobin was 12.3, rechecked 11.3.  He had 77% neutrophils.  His admission CMP showed a glucose of 114, BUN 13, creatinine 1.12, and recheck, his sodium was 134, albumin 3.3.  Recheck glucose is 82.  HIV was nonreactive.  UA shows specific gravity greater than 1.030 with 0-2 WBCs and 0-2 RBCs per HPF and a few bacteria, it was negative for Legionella and strep pneumonia.  Blood cultures x2 were negative for anaerobic or aerobic growth after 3 days.  Chest x-ray showed increased pulmonary vascular congestion and increased streaky opacity at the left lung base, thought to be either secondary to infection or atelectasis.  His admission EKG showed a sinus rhythm.  There is a first-degree AV block, with a PR interval 260 milliseconds.  He was admitted to the hospital and treated with IV azithromycin and IV ceftriaxone.  Sugars were checked.  Blood tests were done.  He was continued on lisinopril, doxazosin, amlodipine, chewable aspirin, gabapentin, Lantus insulin, metoprolol.  He was given sliding scale insulin.  He was on IV fluids.  He gradually recovered on this regimen and by the day prior to discharge, he was up and around with physical therapy's help and was ready for discharge home his 5th hospital day.  FINAL DISCHARGE DIAGNOSES: 1. Community-acquired pneumonia. 2. Benign  essential hypertension. 3. Hypercholesterolemia. 4. Coronary artery disease, status post MI. 5. Peripheral vascular disease. 6. Peripheral neuropathy.  He is discharged home on azithromycin 250 mg daily for 5 days and cefuroxime 250 mg b.i.d. for 5 days.  He is also to continue amlodipine 10 mg daily, aspirin 81 mg daily, doxazosin 4 mg at bedtime, furosemide 40 mg daily, gabapentin 300 mg b.i.d., Lantus insulin 4 units q.a.m., lisinopril 20 mg b.i.d., metoprolol 100 mg b.i.d., potassium chloride 20 mg daily, pravastatin 80 mg daily, vitamin B12 2000 mcg daily, and timolol solution 1 drop into each eye every morning.  He is due for followup in the office within the week.     Mila Homer. Sudie Bailey, M.D.     SDK/MEDQ  D:  08/03/2013  T:  08/03/2013  Job:  [id number]

## 2013-08-04 LAB — CULTURE, BLOOD (ROUTINE X 2): Culture: NO GROWTH

## 2013-09-01 ENCOUNTER — Encounter (INDEPENDENT_AMBULATORY_CARE_PROVIDER_SITE_OTHER): Payer: Self-pay | Admitting: *Deleted

## 2013-10-04 ENCOUNTER — Ambulatory Visit (INDEPENDENT_AMBULATORY_CARE_PROVIDER_SITE_OTHER): Payer: Medicare Other | Admitting: Internal Medicine

## 2013-10-04 ENCOUNTER — Telehealth (INDEPENDENT_AMBULATORY_CARE_PROVIDER_SITE_OTHER): Payer: Self-pay | Admitting: *Deleted

## 2013-10-04 ENCOUNTER — Other Ambulatory Visit (INDEPENDENT_AMBULATORY_CARE_PROVIDER_SITE_OTHER): Payer: Self-pay | Admitting: *Deleted

## 2013-10-04 ENCOUNTER — Encounter (INDEPENDENT_AMBULATORY_CARE_PROVIDER_SITE_OTHER): Payer: Self-pay | Admitting: Internal Medicine

## 2013-10-04 VITALS — BP 132/50 | HR 60 | Temp 97.0°F | Ht 72.0 in | Wt 195.7 lb

## 2013-10-04 DIAGNOSIS — R195 Other fecal abnormalities: Secondary | ICD-10-CM

## 2013-10-04 DIAGNOSIS — Z1211 Encounter for screening for malignant neoplasm of colon: Secondary | ICD-10-CM

## 2013-10-04 DIAGNOSIS — K625 Hemorrhage of anus and rectum: Secondary | ICD-10-CM

## 2013-10-04 MED ORDER — PEG-KCL-NACL-NASULF-NA ASC-C 100 G PO SOLR: 1.0000 | Freq: Once | ORAL | Status: DC

## 2013-10-04 NOTE — Progress Notes (Signed)
Subjective:     Patient ID: Marcus Canterbury Sr., male   DOB: Jan 11, 1941, 72 y.o.   MRN: [id number]  HPI Referred to our office for 2 positive hemocult cards. Patient denies seeing blood.  Appetite is good. No weight loss. He denies melena or bright red rectal bleeding. His BMs are normal. He denies abdominal pain.  No family hx of colon cancer. States his blood sugar are usually low in the am. 70s. Recently in the hospital last month with Community acquired pneumonia. His last colonoscopy was over 20 yrs ago per patient.  Hx CAD and underwent CABG x 3 vessels in 2003 08/10/2013 H and H  12.3 and 37.8, MCV 91.3 HA1c 7.4   Married. Retired from [employer] One child deceased from a "Bad Heart". Died before he could heart transplant.   Review of Systems see hpi Current Outpatient Prescriptions  Medication Sig Dispense Refill  . amLODipine (NORVASC) 10 MG tablet Take 10 mg by mouth daily.        [name] aspirin 81 MG tablet Take 81 mg by mouth every morning.       [name] doxazosin (CARDURA) 4 MG tablet Take 4 mg by mouth every evening.      . furosemide (LASIX) 40 MG tablet Take 40 mg by mouth daily.        [name] gabapentin (NEURONTIN) 300 MG capsule Take 300 mg by mouth 2 (two) times daily.        . insulin glargine (LANTUS) 100 UNIT/ML injection Inject 80 Units into the skin every morning.       [name] lisinopril (PRINIVIL,ZESTRIL) 20 MG tablet Take 1 tablet (20 mg total) by mouth 2 (two) times daily.  180 tablet  1  . metoprolol (LOPRESSOR) 100 MG tablet Take 100 mg by mouth 2 (two) times daily.        . potassium chloride SA (K-DUR,KLOR-CON) 20 MEQ tablet Take 20 mEq by mouth daily.      . pravastatin (PRAVACHOL) 80 MG tablet Take 80 mg by mouth every morning. Pt. must have OV to continue refills, he is 3 months overdue.      . vitamin B-12 (CYANOCOBALAMIN) 1000 MCG tablet Take 2,000 mcg by mouth daily.      . timolol (TIMOPTIC) 0.25 % ophthalmic solution Place 1 drop into both eyes every morning.         No current facility-administered medications for this visit.   Past Medical History  Diagnosis Date  . History of atherosclerotic cardiovascular disease   . History of myocardial infarction   . PVD (peripheral vascular disease)   . Hypertension   . Hyperlipidemia   . Diabetes mellitus   . Cerebrovascular disease   . Tubulovillous adenoma of colon   . ED (erectile dysfunction)   . Inguinal hernia     Right  . Normocytic anemia   . Glaucoma, left eye   . Peripheral neuropathy   . Diabetes mellitus    Past Surgical History  Procedure Laterality Date  . Coronary artery bypass graft  2002    x6  . Cholecystectomy    . Inguinal hernia repair  2004    Left   Allergies  Allergen Reactions  . Bidil [Isosorb Dinitrate-Hydralazine]     PASSED OUT BUT NOT SURE IF THIS WAS THE CAUSE         Objective:   Physical Exam  Filed Vitals:   10/04/13 1052  BP: 132/50  Pulse: 60  Temp: 97 F (  36.1 C)  Height: 6' (1.829 m)  Weight: 195 lb 11.2 oz (88.769 kg)  Alert and oriented. Skin warm and dry. Oral mucosa is moist.   . Sclera anicteric, conjunctivae is pink. Thyroid not enlarged. No cervical lymphadenopathy. Lungs clear. Heart regular rate and rhythm. Loud murmur heard.   Abdomen is soft. Bowel sounds are positive. No hepatomegaly. No abdominal masses felt. No tenderness.  No edema to lower extremities.   Stool brown and guaiac negative.     Assessment:    Rectal bleeding. Colonic neoplasm needs to be ruled out. Colonic polyp AVM are also in the differential.    Plan:    Colonoscopy with Dr Karilyn Cota.The risks and benefits such as perforation, bleeding, and infection were reviewed with the patient and is agreeable.

## 2013-10-04 NOTE — Patient Instructions (Signed)
Colonoscopy with Dr. Rehman. The risks and benefits such as perforation, bleeding, and infection were reviewed with the patient and is agreeable. 

## 2013-10-04 NOTE — Telephone Encounter (Signed)
Patient needs movi prep 

## 2013-10-12 ENCOUNTER — Telehealth (INDEPENDENT_AMBULATORY_CARE_PROVIDER_SITE_OTHER): Payer: Self-pay | Admitting: *Deleted

## 2013-10-12 NOTE — Telephone Encounter (Signed)
Noted  

## 2013-10-12 NOTE — Telephone Encounter (Signed)
Patient called and canceled TCS for 10/27/13 -- stated he wasn't going through with it

## 2013-10-22 ENCOUNTER — Encounter (INDEPENDENT_AMBULATORY_CARE_PROVIDER_SITE_OTHER): Payer: Self-pay

## 2013-10-27 ENCOUNTER — Encounter (HOSPITAL_COMMUNITY): Admission: RE | Payer: Self-pay | Source: Ambulatory Visit

## 2013-10-27 ENCOUNTER — Ambulatory Visit (HOSPITAL_COMMUNITY): Admission: RE | Admit: 2013-10-27 | Payer: Medicare Other | Source: Ambulatory Visit | Admitting: Internal Medicine

## 2013-10-27 SURGERY — COLONOSCOPY
Anesthesia: Moderate Sedation

## 2014-01-18 ENCOUNTER — Other Ambulatory Visit: Payer: Self-pay | Admitting: Cardiovascular Disease

## 2014-06-19 ENCOUNTER — Encounter (HOSPITAL_COMMUNITY): Payer: Self-pay | Admitting: Emergency Medicine

## 2014-06-19 ENCOUNTER — Emergency Department (HOSPITAL_COMMUNITY)
Admission: EM | Admit: 2014-06-19 | Discharge: 2014-06-19 | Disposition: A | Discharge status: Home / Self Care | Payer: Medicare Other | Attending: Emergency Medicine | Admitting: Emergency Medicine

## 2014-06-19 DIAGNOSIS — Z862 Personal history of diseases of the blood and blood-forming organs and certain disorders involving the immune mechanism: Secondary | ICD-10-CM | POA: Insufficient documentation

## 2014-06-19 DIAGNOSIS — Z7982 Long term (current) use of aspirin: Secondary | ICD-10-CM | POA: Insufficient documentation

## 2014-06-19 DIAGNOSIS — M5430 Sciatica, unspecified side: Secondary | ICD-10-CM | POA: Insufficient documentation

## 2014-06-19 DIAGNOSIS — R011 Cardiac murmur, unspecified: Secondary | ICD-10-CM | POA: Insufficient documentation

## 2014-06-19 DIAGNOSIS — Z951 Presence of aortocoronary bypass graft: Secondary | ICD-10-CM | POA: Insufficient documentation

## 2014-06-19 DIAGNOSIS — H409 Unspecified glaucoma: Secondary | ICD-10-CM | POA: Insufficient documentation

## 2014-06-19 DIAGNOSIS — Z79899 Other long term (current) drug therapy: Secondary | ICD-10-CM | POA: Insufficient documentation

## 2014-06-19 DIAGNOSIS — M5441 Lumbago with sciatica, right side: Secondary | ICD-10-CM

## 2014-06-19 DIAGNOSIS — E119 Type 2 diabetes mellitus without complications: Secondary | ICD-10-CM | POA: Insufficient documentation

## 2014-06-19 DIAGNOSIS — Z87891 Personal history of nicotine dependence: Secondary | ICD-10-CM | POA: Insufficient documentation

## 2014-06-19 DIAGNOSIS — G609 Hereditary and idiopathic neuropathy, unspecified: Secondary | ICD-10-CM | POA: Insufficient documentation

## 2014-06-19 DIAGNOSIS — Z8673 Personal history of transient ischemic attack (TIA), and cerebral infarction without residual deficits: Secondary | ICD-10-CM | POA: Insufficient documentation

## 2014-06-19 DIAGNOSIS — Z87448 Personal history of other diseases of urinary system: Secondary | ICD-10-CM | POA: Insufficient documentation

## 2014-06-19 DIAGNOSIS — Z794 Long term (current) use of insulin: Secondary | ICD-10-CM | POA: Insufficient documentation

## 2014-06-19 DIAGNOSIS — I252 Old myocardial infarction: Secondary | ICD-10-CM | POA: Insufficient documentation

## 2014-06-19 DIAGNOSIS — K4090 Unilateral inguinal hernia, without obstruction or gangrene, not specified as recurrent: Secondary | ICD-10-CM

## 2014-06-19 DIAGNOSIS — E785 Hyperlipidemia, unspecified: Secondary | ICD-10-CM | POA: Insufficient documentation

## 2014-06-19 MED ORDER — HYDROCODONE-ACETAMINOPHEN 5-325 MG PO TABS: 2.0000 | ORAL_TABLET | Freq: Four times a day (QID) | ORAL | Status: DC | PRN

## 2014-06-19 NOTE — ED Notes (Signed)
Pt c/o right groin pain that became worse several days ago, denies any injury, states that he does have problems with a hernia in that location

## 2014-06-19 NOTE — ED Provider Notes (Signed)
CSN: [id number]     Arrival date & time 06/19/14  6503 History  This chart was scribed for Babette Relic, MD by Girtha Hake, ED Scribe. The patient was seen in [facility]14/[facility]14. The patient's care was started at 10:46 AM.   Chief Complaint  Patient presents with  . Groin Pain    Patient is a 73 y.o. male presenting with groin pain. The history is provided by the patient. No language interpreter was used.  Groin Pain   HPI Comments: Marcus WARGA Sr. is a 73 y.o. male with a history MI and DM who presents to the Emergency Department complaining of constant, right lateral lower back pain for the past few days. Patient reports that the pain radiates to his right groin and down his right leg all the way to the right knee. He denies change in bowel or bladder control, fever, CP, SOB, abdominal pain,vomting, weakness, numbness, or recent trauma.  PCP is Dr. Karie Kirks.   Past Medical History  Diagnosis Date  . History of atherosclerotic cardiovascular disease   . History of myocardial infarction   . PVD (peripheral vascular disease)   . Hypertension   . Hyperlipidemia   . Diabetes mellitus   . Cerebrovascular disease   . Tubulovillous adenoma of colon   . ED (erectile dysfunction)   . Inguinal hernia     Right  . Normocytic anemia   . Glaucoma, left eye   . Peripheral neuropathy   . Diabetes mellitus    Past Surgical History  Procedure Laterality Date  . Coronary artery bypass graft  2002    x6  . Cholecystectomy    . Inguinal hernia repair  2004    Left   Family History  Problem Relation Age of Onset  . Heart attack Mother   . Stroke Father   . Diabetes Sister   . HIV Brother   . Alcohol abuse Brother   . Diabetes Sister   . Heart attack Son    History  Substance Use Topics  . Smoking status: Former Research scientist (life sciences)  . Smokeless tobacco: Current User    Types: Chew     Comment: quit 12 yrs ago  . Alcohol Use: No    Review of Systems 10 Systems reviewed and are negative  for acute change except as noted in the HPI.    Allergies  Bidil  Home Medications   Prior to Admission medications   Medication Sig Start Date End Date Taking? Authorizing Provider  amLODipine (NORVASC) 10 MG tablet Take 10 mg by mouth daily.      Historical Provider, MD  aspirin 81 MG tablet Take 81 mg by mouth every morning.     Historical Provider, MD  doxazosin (CARDURA) 4 MG tablet Take 4 mg by mouth every evening.    Historical Provider, MD  furosemide (LASIX) 40 MG tablet Take 40 mg by mouth daily.      Historical Provider, MD  gabapentin (NEURONTIN) 300 MG capsule Take 300 mg by mouth 2 (two) times daily.      Historical Provider, MD  HYDROcodone-acetaminophen (NORCO) 5-325 MG per tablet Take 2 tablets by mouth every 6 (six) hours as needed for severe pain. 06/19/14   Babette Relic, MD  insulin glargine (LANTUS) 100 UNIT/ML injection Inject 80 Units into the skin every morning.     Historical Provider, MD  lisinopril (PRINIVIL,ZESTRIL) 20 MG tablet TAKE ONE TABLET BY MOUTH TWICE DAILY. 01/18/14   Lendon Colonel, NP  metoprolol (LOPRESSOR) 100 MG tablet Take 100 mg by mouth 2 (two) times daily.      Historical Provider, MD  peg 3350 powder (MOVIPREP) 100 G SOLR Take 1 kit (200 g total) by mouth once. 10/04/13   Butch Penny, NP  potassium chloride SA (K-DUR,KLOR-CON) 20 MEQ tablet Take 20 mEq by mouth daily.    Historical Provider, MD  pravastatin (PRAVACHOL) 80 MG tablet Take 80 mg by mouth every morning. Pt. must have OV to continue refills, he is 3 months overdue. 04/15/11   Lendon Colonel, NP  timolol (TIMOPTIC) 0.25 % ophthalmic solution Place 1 drop into both eyes every morning.     Historical Provider, MD  vitamin B-12 (CYANOCOBALAMIN) 1000 MCG tablet Take 2,000 mcg by mouth daily.    Historical Provider, MD   Triage Vitals: BP 137/67  Pulse 61  Temp(Src) 97.8 F (36.6 C) (Oral)  Resp 20  Ht '5\' 6"'  (1.676 m)  Wt 185 lb (83.915 kg)  BMI 29.87 kg/m2  SpO2  98% Physical Exam  Nursing note and vitals reviewed. Constitutional:  Awake, alert, nontoxic appearance with baseline speech.  HENT:  Head: Atraumatic.  Eyes: Pupils are equal, round, and reactive to light. Right eye exhibits no discharge. Left eye exhibits no discharge.  Neck: Neck supple.  Cardiovascular: Normal rate and regular rhythm.   Murmur heard. Pulmonary/Chest: Effort normal and breath sounds normal. No respiratory distress. He has no wheezes. He has no rales. He exhibits no tenderness.  Abdominal: Soft. Bowel sounds are normal. He exhibits no mass. There is no tenderness. There is no rebound.  Genitourinary:  Testicle non-tender.   Right chronically incarcerated inguinal hernia. Mildly tender. Non reducible.  Musculoskeletal: He exhibits tenderness.       Thoracic back: He exhibits no tenderness.       Lumbar back: He exhibits no tenderness.  Bilateral lower extremities non tender without new rashes or color change, baseline ROM; unable to palpate DP / PT pulses, CR<2 secs all digits bilaterally, sensation baseline light touch bilaterally for pt, DTR's symmetric and absent bilaterally KJ / AJ, motor symmetric bilateral 5 / 5 hip flexion, quadriceps, hamstrings, EHL, foot dorsiflexion, foot plantarflexion, gait somewhat antalgic but without apparent new ataxia.  Back no rash. Back tender right SI joint.  Normal light touch thighs and lower legs with baseline numbness in both feet.   Neurological:  Mental status baseline for patient.  Upper extremity motor strength and sensation intact and symmetric bilaterally.  Skin: No rash noted.  Psychiatric: He has a normal mood and affect.    ED Course  Procedures (including critical care time) DIAGNOSTIC STUDIES: Oxygen Saturation is 98% on room air, normal by my interpretation.    COORDINATION OF CARE: 10:58 AM - Patient / Family / Caregiver understand and agree with initial ED impression and plan with expectations set for ED  visit.    Labs Review Labs Reviewed - No data to display  Imaging Review No results found.   EKG Interpretation None      MDM   Final diagnoses:  Right-sided low back pain with right-sided sciatica  Inguinal hernia, right    I doubt any other EMC precluding discharge at this time including, but not necessarily limited to the following:AAA, cauda equina, SBI, strangulated hernia.  This chart was scribed in my presence and reviewed by me personally.   Babette Relic, MD 06/20/14 [id number]

## 2014-06-19 NOTE — Discharge Instructions (Signed)
SEEK IMMEDIATE MEDICAL ATTENTION IF: New numbness, tingling, weakness, or problem with the use of your arms or legs.  Severe back pain not relieved with medications.  Change in bowel or bladder control.  Increasing pain in any areas of the body (such as chest or abdominal pain).  Shortness of breath, dizziness or fainting.  Nausea (feeling sick to your stomach), vomiting, fever, or sweats.  

## 2014-06-22 ENCOUNTER — Other Ambulatory Visit (HOSPITAL_COMMUNITY): Payer: Self-pay | Admitting: Family Medicine

## 2014-06-22 ENCOUNTER — Ambulatory Visit (HOSPITAL_COMMUNITY)
Admission: RE | Admit: 2014-06-22 | Discharge: 2014-06-22 | Disposition: A | Discharge status: Home / Self Care | Payer: Medicare Other | Source: Ambulatory Visit | Attending: Family Medicine | Admitting: Family Medicine

## 2014-06-22 DIAGNOSIS — I708 Atherosclerosis of other arteries: Secondary | ICD-10-CM | POA: Insufficient documentation

## 2014-06-22 DIAGNOSIS — Q762 Congenital spondylolisthesis: Secondary | ICD-10-CM | POA: Insufficient documentation

## 2014-06-22 DIAGNOSIS — M5441 Lumbago with sciatica, right side: Secondary | ICD-10-CM

## 2014-06-22 DIAGNOSIS — M5431 Sciatica, right side: Secondary | ICD-10-CM

## 2014-06-28 ENCOUNTER — Other Ambulatory Visit (HOSPITAL_COMMUNITY): Payer: Self-pay | Admitting: Family Medicine

## 2014-06-28 DIAGNOSIS — M5441 Lumbago with sciatica, right side: Secondary | ICD-10-CM

## 2014-06-30 ENCOUNTER — Ambulatory Visit (HOSPITAL_COMMUNITY)
Admission: RE | Admit: 2014-06-30 | Discharge: 2014-06-30 | Disposition: A | Discharge status: Home / Self Care | Payer: Medicare Other | Source: Ambulatory Visit | Attending: Family Medicine | Admitting: Family Medicine

## 2014-06-30 DIAGNOSIS — R29898 Other symptoms and signs involving the musculoskeletal system: Secondary | ICD-10-CM | POA: Insufficient documentation

## 2014-06-30 DIAGNOSIS — M5441 Lumbago with sciatica, right side: Secondary | ICD-10-CM

## 2014-06-30 DIAGNOSIS — M545 Low back pain, unspecified: Secondary | ICD-10-CM | POA: Insufficient documentation

## 2014-06-30 DIAGNOSIS — M4310 Spondylolisthesis, site unspecified: Secondary | ICD-10-CM | POA: Insufficient documentation

## 2014-06-30 DIAGNOSIS — M48061 Spinal stenosis, lumbar region without neurogenic claudication: Secondary | ICD-10-CM | POA: Insufficient documentation

## 2014-06-30 DIAGNOSIS — M5126 Other intervertebral disc displacement, lumbar region: Secondary | ICD-10-CM | POA: Insufficient documentation

## 2014-07-06 ENCOUNTER — Other Ambulatory Visit: Payer: Self-pay | Admitting: Family Medicine

## 2014-07-06 DIAGNOSIS — M545 Low back pain: Secondary | ICD-10-CM

## 2014-07-12 ENCOUNTER — Other Ambulatory Visit (INDEPENDENT_AMBULATORY_CARE_PROVIDER_SITE_OTHER): Payer: Self-pay | Admitting: *Deleted

## 2014-07-12 ENCOUNTER — Ambulatory Visit (INDEPENDENT_AMBULATORY_CARE_PROVIDER_SITE_OTHER): Payer: Medicare Other | Admitting: Internal Medicine

## 2014-07-12 ENCOUNTER — Encounter (INDEPENDENT_AMBULATORY_CARE_PROVIDER_SITE_OTHER): Payer: Self-pay | Admitting: Internal Medicine

## 2014-07-12 ENCOUNTER — Telehealth (INDEPENDENT_AMBULATORY_CARE_PROVIDER_SITE_OTHER): Payer: Self-pay | Admitting: *Deleted

## 2014-07-12 VITALS — BP 112/40 | HR 60 | Temp 97.9°F | Ht 66.0 in | Wt 189.4 lb

## 2014-07-12 DIAGNOSIS — R634 Abnormal weight loss: Secondary | ICD-10-CM

## 2014-07-12 DIAGNOSIS — R195 Other fecal abnormalities: Secondary | ICD-10-CM

## 2014-07-12 DIAGNOSIS — Z86010 Personal history of colonic polyps: Secondary | ICD-10-CM

## 2014-07-12 DIAGNOSIS — Z1211 Encounter for screening for malignant neoplasm of colon: Secondary | ICD-10-CM

## 2014-07-12 MED ORDER — PEG 3350-KCL-NA BICARB-NACL 420 G PO SOLR: 4000.0000 mL | Freq: Once | ORAL | Status: DC

## 2014-07-12 NOTE — Telephone Encounter (Signed)
Patient needs trilyte 

## 2014-07-12 NOTE — Patient Instructions (Signed)
Colonoscopy.  The risks and benefits such as perforation, bleeding, and infection were reviewed with the patient and is agreeable. 

## 2014-07-12 NOTE — Progress Notes (Addendum)
Subjective:     Patient ID: Marcus Dills Sr., male   DOB: 02/21/1941, 73 y.o.   MRN: [id number]  HPI Referred to our office by Dr. Karie Kirks for surveillance colonoscopy/hx of colon polyps. He was actually seen in our office in October for 2 positive hemoccult cards. Scheduled for colonoscopy but patient cancelled. Last weight in October 195.11.2oz. Today his weight 189.4. He denies having rectal bleeding. He has had about a 6 pound weight loss. Appetite is good. No abdominal pain. Usually has a BM daily. No BRRB or melena.  No family hx of colon cancer.  States his blood sugar are usually low in the am 80s or lower   Hx CAD and underwent CABG x 3 vessels in 2003  08/10/2013 H and H 12.3 and 37.8, MCV 91.3  HA1c 7.4  Married.  Retired from [employer]  One child deceased from a "Bad Heart". Died before he could heart transplant.   Per Dr. Karie Kirks last colonoscopy in 2007 with polyps Carren Rang). Per patient, Dr. Tamala Julian performed the colonoscopy.  This report is unavailable in Epic. Three small irregular, pedunculated polyps in the ascending colon. All polyps were removed by snare cautery. Colonoscopy otherwise normal.  Biopsy: Tubular adenoma . No high grade dysplasia or evidence of malignancy is identified.  Review of Systems Past Medical History  Diagnosis Date  . History of atherosclerotic cardiovascular disease   . History of myocardial infarction   . PVD (peripheral vascular disease)   . Hypertension   . Hyperlipidemia   . Diabetes mellitus   . Cerebrovascular disease   . Tubulovillous adenoma of colon   . ED (erectile dysfunction)   . Inguinal hernia     Right  . Normocytic anemia   . Glaucoma, left eye   . Peripheral neuropathy   . Diabetes mellitus     Past Surgical History  Procedure Laterality Date  . Coronary artery bypass graft  2002    x6  . Cholecystectomy    . Inguinal hernia repair  2004    Left    Allergies  Allergen Reactions  . Bidil  [Isosorb Dinitrate-Hydralazine]     PASSED OUT BUT NOT SURE IF THIS WAS THE CAUSE    Current Outpatient Prescriptions on File Prior to Visit  Medication Sig Dispense Refill  . amLODipine (NORVASC) 10 MG tablet Take 10 mg by mouth daily.        [name] aspirin 81 MG tablet Take 81 mg by mouth every morning.       [name] doxazosin (CARDURA) 4 MG tablet Take 4 mg by mouth every evening.      . furosemide (LASIX) 40 MG tablet Take 40 mg by mouth daily.        [name] gabapentin (NEURONTIN) 300 MG capsule Take 300 mg by mouth. One in am and two at HS      . insulin glargine (LANTUS) 100 UNIT/ML injection Inject 60 Units into the skin every morning.       [name] lisinopril (PRINIVIL,ZESTRIL) 20 MG tablet TAKE ONE TABLET BY MOUTH TWICE DAILY.  90 tablet  3  . metoprolol (LOPRESSOR) 100 MG tablet Take 100 mg by mouth 2 (two) times daily.        . potassium chloride SA (K-DUR,KLOR-CON) 20 MEQ tablet Take 20 mEq by mouth daily.      . pravastatin (PRAVACHOL) 80 MG tablet Take 80 mg by mouth every morning. Pt. must have OV to continue refills, he is 3 months overdue.      [name]  timolol (TIMOPTIC) 0.25 % ophthalmic solution Place 1 drop into both eyes every morning.       . vitamin B-12 (CYANOCOBALAMIN) 1000 MCG tablet Take 2,000 mcg by mouth daily.       No current facility-administered medications on file prior to visit.        Objective:   Physical Exam  Filed Vitals:   07/12/14 1400  BP: 112/40  Pulse: 60  Temp: 97.9 F (36.6 C)  Height: 5\' 6"  (1.676 m)  Weight: 189 lb 6.4 oz (85.911 kg)   Alert and oriented. Skin warm and dry. Oral mucosa is moist.   . Sclera anicteric, conjunctivae is pink. Thyroid not enlarged. No cervical lymphadenopathy. Lungs clear. Heart regular rate and rhythm.  Abdomen is soft. Bowel sounds are positive. No hepatomegaly. No abdominal masses felt. No tenderness.  No edema to lower extremities. Stool brown and guaiac positive.     Assessment:     Weight loss and heme positive stool.   Colonic neoplasm, polyp, AVM, ulcer needs to be ruled out.      Plan:    Colonoscopy with Dr. Laural Golden. The risks and benefits such as perforation, bleeding, and infection were reviewed with the patient and is agreeable. I have asked Charna Busman to find colonoscopy report and to get labs from Dr. Vickey Sages office.

## 2014-07-13 ENCOUNTER — Telehealth: Payer: Self-pay

## 2014-07-13 NOTE — Telephone Encounter (Signed)
Received a referral from Dr Karie Kirks for pt to be scheduled ASAP for colonoscopy.  I checked the computer and pt saw Deberah Castle, PA at Dr. Olevia Perches yesterday and is scheduled for colonoscopy on 07/22/2014 with Dr. Laural Golden. I called and spoke with Sarah at Dr. Vickey Sages and made her aware that he has been seen and is scheduled for the colonoscopy. They are fine, just wanted to make sure he was seen right away.

## 2014-07-14 ENCOUNTER — Other Ambulatory Visit: Payer: Medicare Other

## 2014-07-15 ENCOUNTER — Encounter (HOSPITAL_COMMUNITY): Payer: Self-pay | Admitting: Pharmacy Technician

## 2014-07-22 ENCOUNTER — Encounter (HOSPITAL_COMMUNITY)
Admission: RE | Disposition: A | Discharge status: Home / Self Care | Payer: Self-pay | Source: Ambulatory Visit | Attending: Internal Medicine

## 2014-07-22 ENCOUNTER — Ambulatory Visit (HOSPITAL_COMMUNITY)
Admission: RE | Admit: 2014-07-22 | Discharge: 2014-07-22 | Disposition: A | Discharge status: Home / Self Care | Payer: Medicare Other | Source: Ambulatory Visit | Attending: Internal Medicine | Admitting: Internal Medicine

## 2014-07-22 ENCOUNTER — Encounter (HOSPITAL_COMMUNITY): Payer: Self-pay | Admitting: *Deleted

## 2014-07-22 DIAGNOSIS — Z860100 Personal history of colon polyps, unspecified: Secondary | ICD-10-CM

## 2014-07-22 DIAGNOSIS — E119 Type 2 diabetes mellitus without complications: Secondary | ICD-10-CM | POA: Insufficient documentation

## 2014-07-22 DIAGNOSIS — E785 Hyperlipidemia, unspecified: Secondary | ICD-10-CM | POA: Insufficient documentation

## 2014-07-22 DIAGNOSIS — N529 Male erectile dysfunction, unspecified: Secondary | ICD-10-CM | POA: Diagnosis not present

## 2014-07-22 DIAGNOSIS — K921 Melena: Secondary | ICD-10-CM

## 2014-07-22 DIAGNOSIS — Z87891 Personal history of nicotine dependence: Secondary | ICD-10-CM | POA: Diagnosis not present

## 2014-07-22 DIAGNOSIS — K644 Residual hemorrhoidal skin tags: Secondary | ICD-10-CM | POA: Diagnosis not present

## 2014-07-22 DIAGNOSIS — Z888 Allergy status to other drugs, medicaments and biological substances status: Secondary | ICD-10-CM | POA: Diagnosis not present

## 2014-07-22 DIAGNOSIS — Z7982 Long term (current) use of aspirin: Secondary | ICD-10-CM | POA: Insufficient documentation

## 2014-07-22 DIAGNOSIS — D649 Anemia, unspecified: Secondary | ICD-10-CM | POA: Insufficient documentation

## 2014-07-22 DIAGNOSIS — D126 Benign neoplasm of colon, unspecified: Secondary | ICD-10-CM

## 2014-07-22 DIAGNOSIS — I739 Peripheral vascular disease, unspecified: Secondary | ICD-10-CM | POA: Insufficient documentation

## 2014-07-22 DIAGNOSIS — I10 Essential (primary) hypertension: Secondary | ICD-10-CM | POA: Diagnosis not present

## 2014-07-22 DIAGNOSIS — Z79899 Other long term (current) drug therapy: Secondary | ICD-10-CM | POA: Insufficient documentation

## 2014-07-22 DIAGNOSIS — K5730 Diverticulosis of large intestine without perforation or abscess without bleeding: Secondary | ICD-10-CM | POA: Insufficient documentation

## 2014-07-22 DIAGNOSIS — K648 Other hemorrhoids: Secondary | ICD-10-CM | POA: Diagnosis not present

## 2014-07-22 DIAGNOSIS — Z794 Long term (current) use of insulin: Secondary | ICD-10-CM | POA: Diagnosis not present

## 2014-07-22 DIAGNOSIS — R195 Other fecal abnormalities: Secondary | ICD-10-CM

## 2014-07-22 DIAGNOSIS — R634 Abnormal weight loss: Secondary | ICD-10-CM

## 2014-07-22 HISTORY — PX: COLONOSCOPY: SHX5424

## 2014-07-22 LAB — GLUCOSE, CAPILLARY: GLUCOSE-CAPILLARY: 99 mg/dL (ref 70–99)

## 2014-07-22 SURGERY — COLONOSCOPY
Anesthesia: Moderate Sedation

## 2014-07-22 MED ORDER — SODIUM CHLORIDE 0.9 % IV SOLN
INTRAVENOUS | Status: DC
  Administered 2014-07-22: 10:00:00 via INTRAVENOUS

## 2014-07-22 MED ORDER — STERILE WATER FOR IRRIGATION IR SOLN
Status: DC | PRN
  Administered 2014-07-22: 11:00:00

## 2014-07-22 MED ORDER — MIDAZOLAM HCL 5 MG/5ML IJ SOLN
INTRAMUSCULAR | Status: AC
  Filled 2014-07-22: qty 10

## 2014-07-22 MED ORDER — MEPERIDINE HCL 50 MG/ML IJ SOLN
INTRAMUSCULAR | Status: AC
  Filled 2014-07-22: qty 1

## 2014-07-22 MED ORDER — MIDAZOLAM HCL 5 MG/5ML IJ SOLN
INTRAMUSCULAR | Status: DC | PRN
  Administered 2014-07-22: 2 mg via INTRAVENOUS
  Administered 2014-07-22: 1 mg via INTRAVENOUS
  Administered 2014-07-22: 2 mg via INTRAVENOUS

## 2014-07-22 MED ORDER — MEPERIDINE HCL 50 MG/ML IJ SOLN
INTRAMUSCULAR | Status: DC | PRN
  Administered 2014-07-22: 25 mg via INTRAVENOUS

## 2014-07-22 NOTE — Op Note (Signed)
COLONOSCOPY PROCEDURE REPORT  PATIENT:  Marcus CHAVARRIA Sr.  MR#:  [id number] Birthdate:  May 26, 1941, 73 y.o., male Endoscopist:  Dr. [name], MD Referred By:  Dr. Estill Bamberg. Karie Kirks, MD  Procedure Date: 07/22/2014  Procedure:   Colonoscopy  Indications: Patient is a 73 year old African American male with history of colonic adenoma and was recently found to have heme positive stool. He has no GI symptoms. He is on low-dose aspirin. Family history is negative for CRC.  Informed Consent:  The procedure and risks were reviewed with the patient and informed consent was obtained.  Medications:  Demerol 25 mg IV Versed 5 mg IV  Description of procedure:  After a digital rectal exam was performed, that colonoscope was advanced from the anus through the rectum and colon to the area of the cecum, ileocecal valve and appendiceal orifice. The cecum was deeply intubated. These structures were well-seen and photographed for the record. From the level of the cecum and ileocecal valve, the scope was slowly and cautiously withdrawn. The mucosal surfaces were carefully surveyed utilizing scope tip to flexion to facilitate fold flattening as needed. The scope was pulled down into the rectum where a thorough exam including retroflexion was performed.  Findings:   Prep excellent. Few small diverticula noted at the ascending and sigmoid colon. 52mm pedunculated polyp with surface erosion in it from sigmoid colon. Normal rectal mucosa. Small hemorrhoids above and below the dentate line.   Therapeutic/Diagnostic Maneuvers Performed:  See above  Complications:  None  Cecal Withdrawal Time:  12 minutes  Impression:  Examination performed to cecum. Few diverticula at descending and sigmoid colon. 5mm pedunculated polyp with surface erosions noted from sigmoid colon. Internal and external hemorrhoids.  Recommendations:  Standard instructions given. No aspirin for one week. I will contact  patient with biopsy results and further recommendations.  REHMAN,NAJEEB U  07/22/2014 11:10 AM  CC: Dr. Robert Bellow, MD & Dr. Rayne Du ref. provider found

## 2014-07-22 NOTE — H&P (Signed)
Marcus TSANG Sr. is an 73 y.o. male.   Chief Complaint: Patient is here for colonoscopy. HPI: Patient is a 24-year-old black male who is here for diagnostic colonoscopy. He was found to have heme-positive stools. He was initially seen in October 24 2 decided not to have a colonoscopy. None recently. He denies abdominal pain change in his bowel habits rectal bleeding or melena. He is on low-dose aspirin. He does not take OTC NSAIDs. Last colonoscopy was in June 2007 by Dr. Irving Shows with removal of polyp from ascending colon and was tubular adenoma. Family history is negative for CRC.  Past Medical History  Diagnosis Date  . History of atherosclerotic cardiovascular disease   . History of myocardial infarction   . PVD (peripheral vascular disease)   . Hypertension   . Hyperlipidemia   . Diabetes mellitus   . Cerebrovascular disease   . Tubulovillous adenoma of colon   . ED (erectile dysfunction)   . Inguinal hernia     Right  . Normocytic anemia   . Glaucoma, left eye   . Peripheral neuropathy   . Diabetes mellitus     Past Surgical History  Procedure Laterality Date  . Coronary artery bypass graft  2002    x6  . Cholecystectomy    . Inguinal hernia repair  2004    Left    Family History  Problem Relation Age of Onset  . Heart attack Mother   . Stroke Father   . Diabetes Sister   . HIV Brother   . Alcohol abuse Brother   . Diabetes Sister   . Heart attack Son    Social History:  reports that he has quit smoking. His smokeless tobacco use includes Chew. He reports that he does not drink alcohol or use illicit drugs.  Allergies:  Allergies  Allergen Reactions  . Bidil [Isosorb Dinitrate-Hydralazine]     PASSED OUT BUT NOT SURE IF THIS WAS THE CAUSE    Medications Prior to Admission  Medication Sig Dispense Refill  . amLODipine (NORVASC) 10 MG tablet Take 10 mg by mouth daily.        [name] aspirin 81 MG tablet Take 81 mg by mouth every morning.       [name] doxazosin  (CARDURA) 4 MG tablet Take 4 mg by mouth every evening.      . ferrous sulfate 325 (65 FE) MG tablet Take by mouth as needed.      . furosemide (LASIX) 40 MG tablet Take 40 mg by mouth daily.        [name] gabapentin (NEURONTIN) 300 MG capsule Take 300 mg by mouth. One in am and two at HS      . insulin glargine (LANTUS) 100 UNIT/ML injection Inject 60 Units into the skin every morning.       [name] lisinopril (PRINIVIL,ZESTRIL) 20 MG tablet TAKE ONE TABLET BY MOUTH TWICE DAILY.  90 tablet  3  . metoprolol (LOPRESSOR) 100 MG tablet Take 100 mg by mouth 2 (two) times daily.        . potassium chloride SA (K-DUR,KLOR-CON) 20 MEQ tablet Take 20 mEq by mouth daily.      . pravastatin (PRAVACHOL) 80 MG tablet Take 80 mg by mouth every morning. Pt. must have OV to continue refills, he is 3 months overdue.      . timolol (TIMOPTIC) 0.25 % ophthalmic solution Place 1 drop into both eyes every morning.       . vitamin  B-12 (CYANOCOBALAMIN) 1000 MCG tablet Take 2,000 mcg by mouth daily.        Results for orders placed during the hospital encounter of 07/22/14 (from the past 48 hour(s))  GLUCOSE, CAPILLARY     Status: None   Collection Time    07/22/14  9:54 AM      Result Value Ref Range   Glucose-Capillary 99  70 - 99 mg/dL   No results found.  ROS  Blood pressure 170/68, pulse 59, temperature 98.8 F (37.1 C), temperature source Oral, resp. rate 19, SpO2 100.00%. Physical Exam  Constitutional: He appears well-developed and well-nourished.  HENT:  Mouth/Throat: Oropharynx is clear and moist.  Eyes: Conjunctivae are normal. No scleral icterus.  Neck: No thyromegaly present.  Cardiovascular: Normal rate and regular rhythm.   Murmur (loud holosystolic murmur best heard at apex) heard. Respiratory: Effort normal and breath sounds normal.  GI: Soft. He exhibits no distension and no mass. There is no tenderness.  Musculoskeletal: He exhibits no edema.  Lymphadenopathy:    He has no cervical adenopathy.   Neurological: He is alert.  Skin: Skin is warm and dry.     Assessment/Plan Heme-positive stool. History of colonic adenoma. Diagnostic colonoscopy.  REHMAN,NAJEEB U 07/22/2014, 10:36 AM

## 2014-07-22 NOTE — Discharge Instructions (Signed)
No aspirin for one week. Resume the medications as before. Resume usual diet. No driving for 24 hours. Physician will call with biopsy results.  Colonoscopy, Care After Refer to this sheet in the next few weeks. These instructions provide you with information on caring for yourself after your procedure. Your health care provider may also give you more specific instructions. Your treatment has been planned according to current medical practices, but problems sometimes occur. Call your health care provider if you have any problems or questions after your procedure. WHAT TO EXPECT AFTER THE PROCEDURE  After your procedure, it is typical to have the following:  A small amount of blood in your stool.  Moderate amounts of gas and mild abdominal cramping or bloating. HOME CARE INSTRUCTIONS  Do not drive, operate machinery, or sign important documents for 24 hours.  You may shower and resume your regular physical activities, but move at a slower pace for the first 24 hours.  Take frequent rest periods for the first 24 hours.  Walk around or put a warm pack on your abdomen to help reduce abdominal cramping and bloating.  Drink enough fluids to keep your urine clear or pale yellow.  You may resume your normal diet as instructed by your health care provider. Avoid heavy or fried foods that are hard to digest.  Avoid drinking alcohol for 24 hours or as instructed by your health care provider.  Only take over-the-counter or prescription medicines as directed by your health care provider.  If a tissue sample (biopsy) was taken during your procedure:  Do not take aspirin or blood thinners for 7 days, or as instructed by your health care provider.  Do not drink alcohol for 7 days, or as instructed by your health care provider.  Eat soft foods for the first 24 hours. SEEK MEDICAL CARE IF: You have persistent spotting of blood in your stool 2-3 days after the procedure. SEEK IMMEDIATE MEDICAL  CARE IF:  You have more than a small spotting of blood in your stool.  You pass large blood clots in your stool.  Your abdomen is swollen (distended).  You have nausea or vomiting.  You have a fever.  You have increasing abdominal pain that is not relieved with medicine. Document Released: 07/16/2004 Document Revised: 09/22/2013 Document Reviewed: 08/09/2013 [facility] Patient Information 2015 [place], [place]. This information is not intended to replace advice given to you by your health care provider. Make sure you discuss any questions you have with your health care provider.

## 2014-07-27 ENCOUNTER — Encounter (HOSPITAL_COMMUNITY): Payer: Self-pay | Admitting: Internal Medicine

## 2014-08-02 ENCOUNTER — Encounter (INDEPENDENT_AMBULATORY_CARE_PROVIDER_SITE_OTHER): Payer: Self-pay | Admitting: *Deleted

## 2014-12-07 ENCOUNTER — Encounter (INDEPENDENT_AMBULATORY_CARE_PROVIDER_SITE_OTHER): Payer: Self-pay

## 2014-12-21 ENCOUNTER — Ambulatory Visit: Payer: Medicare Other | Admitting: Cardiovascular Disease

## 2015-03-14 IMAGING — CR DG CHEST 2V
2 series · 2 of 2 positions shown · non-contrast
Comparison: 03/13/2008

CLINICAL DATA: Altered mental status

CHEST - 2 VIEW

[w chest pa]
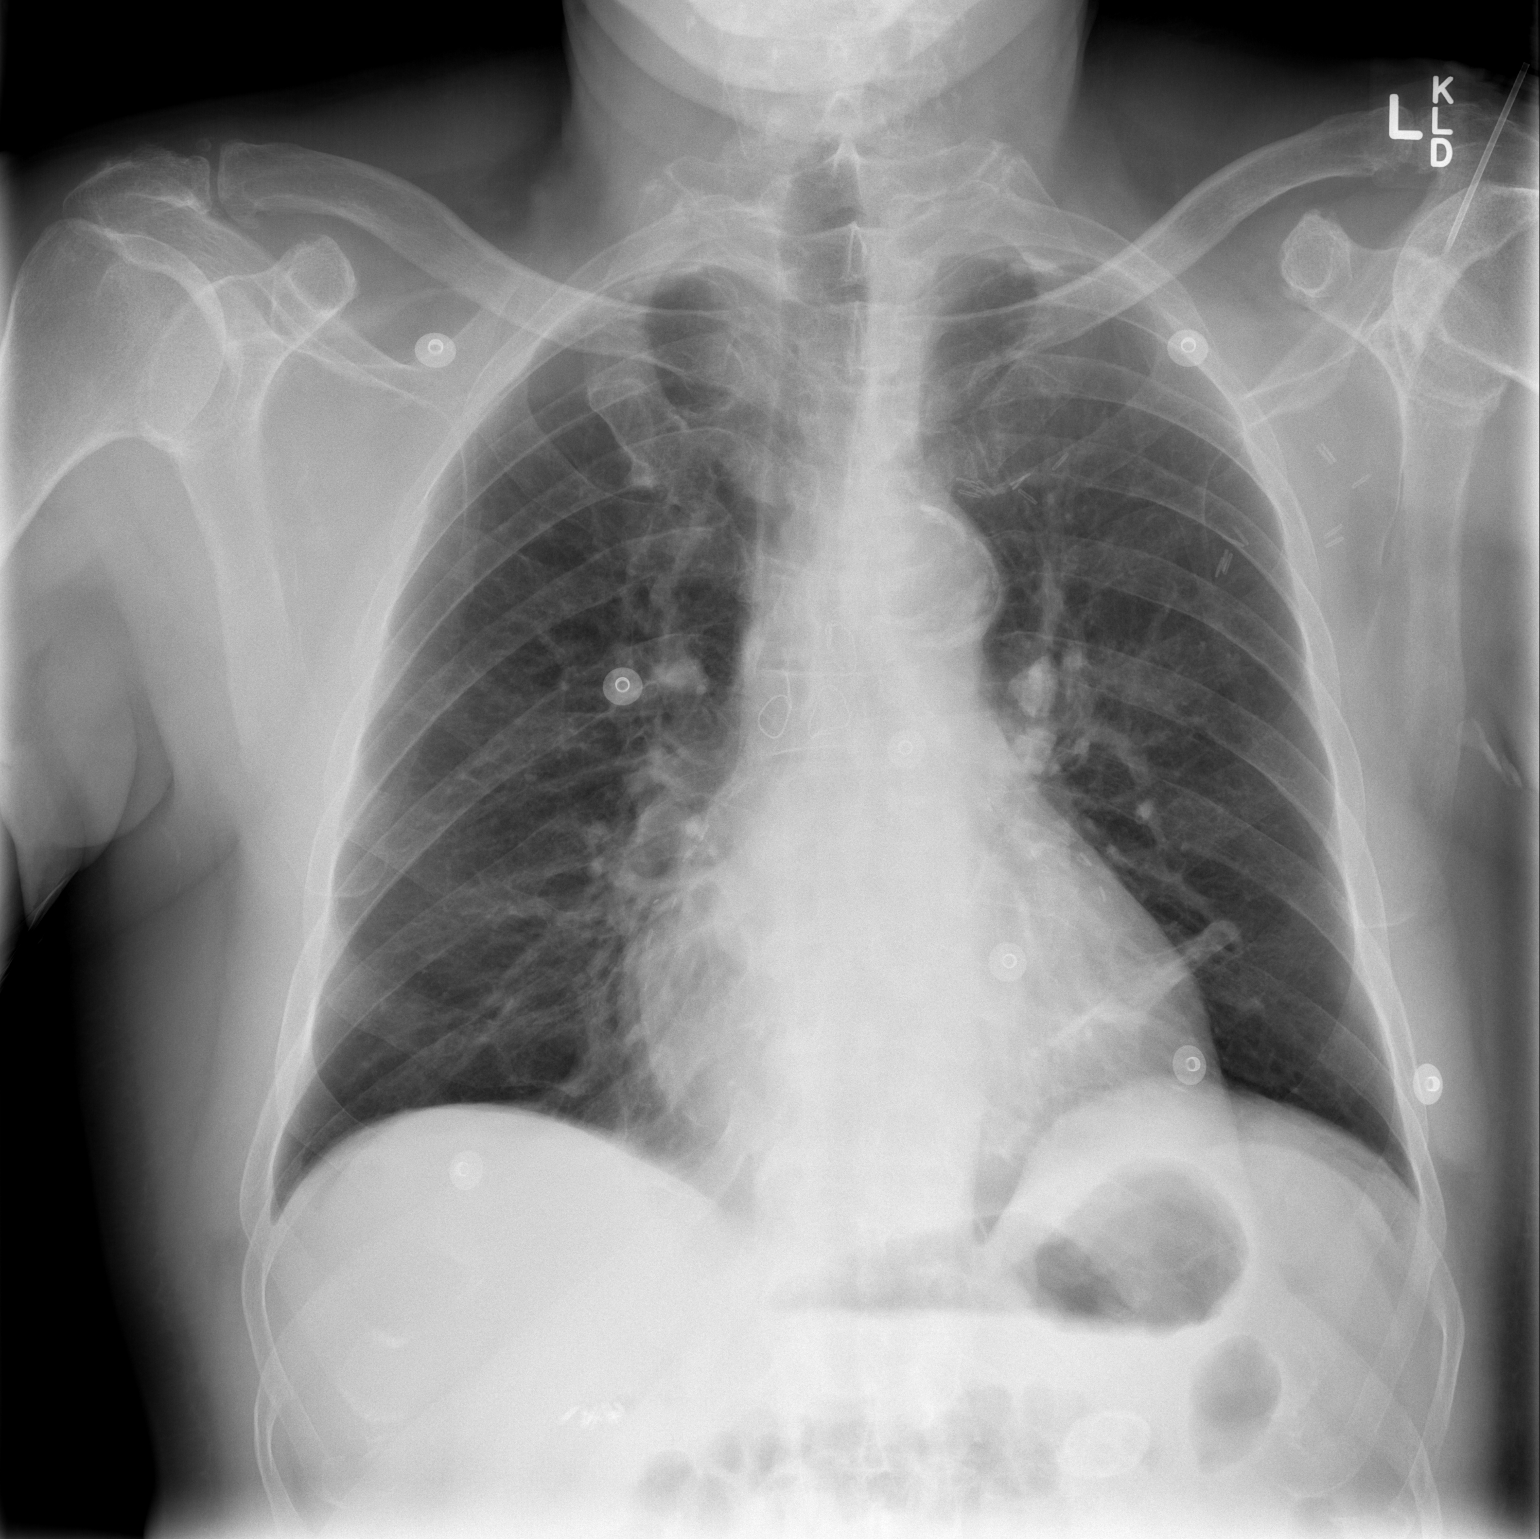

[w chest lat]
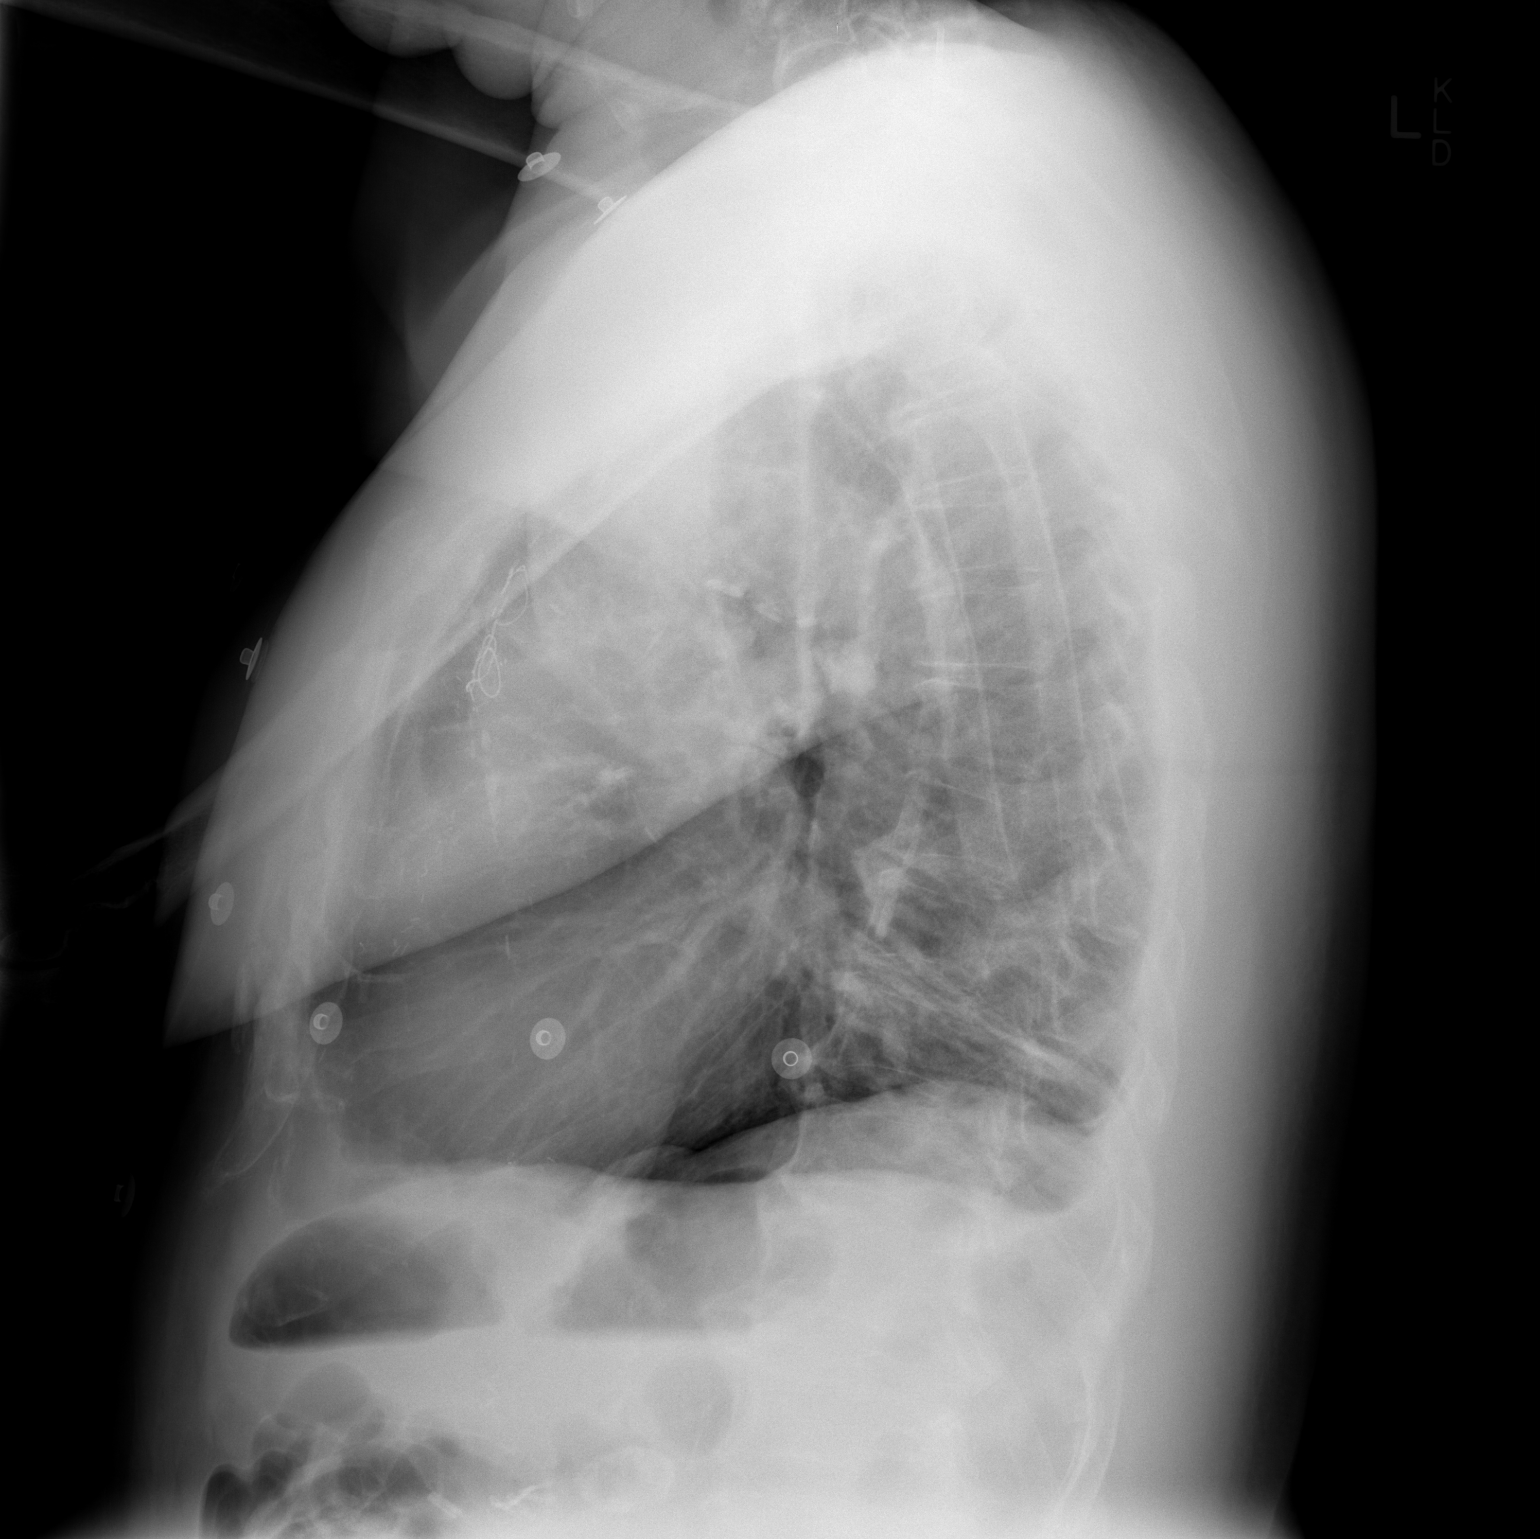

[2 of 2 positions shown; findings below may reference images not displayed]

FINDINGS: Cardiac enlargement with prior cardiac surgery.  Negative
for heart failure.

Mild left lower lobe atelectasis.  Negative for pneumonia.  Small
pleural effusions on the lateral view.
IMPRESSION: Mild left lower lobe atelectasis.  Negative for heart failure or
pneumonia.

## 2015-08-25 ENCOUNTER — Encounter: Payer: Self-pay | Admitting: Cardiology

## 2015-08-25 ENCOUNTER — Ambulatory Visit (INDEPENDENT_AMBULATORY_CARE_PROVIDER_SITE_OTHER): Payer: Medicare Other | Admitting: Cardiology

## 2015-08-25 VITALS — BP 134/67 | HR 54 | Ht 66.0 in | Wt 188.0 lb

## 2015-08-25 DIAGNOSIS — I2510 Atherosclerotic heart disease of native coronary artery without angina pectoris: Secondary | ICD-10-CM | POA: Diagnosis not present

## 2015-08-25 DIAGNOSIS — I10 Essential (primary) hypertension: Secondary | ICD-10-CM | POA: Diagnosis not present

## 2015-08-25 DIAGNOSIS — I6529 Occlusion and stenosis of unspecified carotid artery: Secondary | ICD-10-CM | POA: Diagnosis not present

## 2015-08-25 DIAGNOSIS — I340 Nonrheumatic mitral (valve) insufficiency: Secondary | ICD-10-CM | POA: Diagnosis not present

## 2015-08-25 DIAGNOSIS — E785 Hyperlipidemia, unspecified: Secondary | ICD-10-CM

## 2015-08-25 MED ORDER — ATORVASTATIN CALCIUM 80 MG PO TABS: 80.0000 mg | ORAL_TABLET | Freq: Every day | ORAL | Status: DC

## 2015-08-25 NOTE — Patient Instructions (Addendum)
Your physician wants you to follow-up in: 6 months with Dr. Bryna Colander will receive a reminder letter in the mail two months in advance. If you don't receive a letter, please call our office to schedule the follow-up appointment.  Your physician has recommended you make the following change in your medication:   STOP PRAVASTATIN   START ATORVASTATIN   Your physician has requested that you have an echocardiogram. Echocardiography is a painless test that uses sound waves to create images of your heart. It provides your doctor with information about the size and shape of your heart and how well your heart's chambers and valves are working. This procedure takes approximately one hour. There are no restrictions for this procedure.  Your physician has requested that you have a carotid duplex. This test is an ultrasound of the carotid arteries in your neck. It looks at blood flow through these arteries that supply the brain with blood. Allow one hour for this exam. There are no restrictions or special instructions.  Thank you for choosing [facility]!!

## 2015-08-25 NOTE — Progress Notes (Signed)
Patient ID: BARNABAS HENRIQUES Sr., male   DOB: [birth year], 74 y.o.   MRN: [id number]     Clinical Summary Mr. Doyle is a 74 y.o.male seen today for follow up of the following medical problems.   1. CAD - history of prior CABG in 2002 - denies any chest pain. Stable DOE for several years, example using his weedeater can some some DOE>  - denies any LE edema, no orthopnea - compliant with meds    2. PAD - denies any claudication. Notes some hip pain bilaterally.    3. HTN - compliant with meds - does not check bp at home.  4. Hyperlipidemia 07/2015 TC 138 TG 57 HDL 38 LDL 89 - compliant with pravastatin   5. Carotid stenosis - 2011 [place] with signifnant plaque, no significant stenosis - denies any neuro symptoms  6. Mitral regurgitation - mild to moderate by TTE 06/2013, posterior leaflet prolapse.  - stable DOE, no significant edema.   Past Medical History  Diagnosis Date  . History of atherosclerotic cardiovascular disease   . History of myocardial infarction   . PVD (peripheral vascular disease)   . Hypertension   . Hyperlipidemia   . Diabetes mellitus   . Cerebrovascular disease   . Tubulovillous adenoma of colon   . ED (erectile dysfunction)   . Inguinal hernia     Right  . Normocytic anemia   . Glaucoma, left eye   . Peripheral neuropathy   . Diabetes mellitus      Allergies  Allergen Reactions  . Bidil [Isosorb Dinitrate-Hydralazine]     PASSED OUT BUT NOT SURE IF THIS WAS THE CAUSE     Current Outpatient Prescriptions  Medication Sig Dispense Refill  . amLODipine (NORVASC) 10 MG tablet Take 10 mg by mouth daily.      [name] doxazosin (CARDURA) 4 MG tablet Take 4 mg by mouth every evening.    . ferrous sulfate 325 (65 FE) MG tablet Take by mouth as needed.    . furosemide (LASIX) 40 MG tablet Take 40 mg by mouth daily.      [name] gabapentin (NEURONTIN) 300 MG capsule Take 300 mg by mouth. One in am and two at HS    . insulin glargine (LANTUS) 100 UNIT/ML  injection Inject 60 Units into the skin every morning.     [name] lisinopril (PRINIVIL,ZESTRIL) 20 MG tablet TAKE ONE TABLET BY MOUTH TWICE DAILY. 90 tablet 3  . metoprolol (LOPRESSOR) 100 MG tablet Take 100 mg by mouth 2 (two) times daily.      . potassium chloride SA (K-DUR,KLOR-CON) 20 MEQ tablet Take 20 mEq by mouth daily.    . pravastatin (PRAVACHOL) 80 MG tablet Take 80 mg by mouth every morning. Pt. must have OV to continue refills, he is 3 months overdue.    . timolol (TIMOPTIC) 0.25 % ophthalmic solution Place 1 drop into both eyes every morning.     . vitamin B-12 (CYANOCOBALAMIN) 1000 MCG tablet Take 2,000 mcg by mouth daily.     No current facility-administered medications for this visit.     Past Surgical History  Procedure Laterality Date  . Coronary artery bypass graft  2002    x6  . Cholecystectomy    . Inguinal hernia repair  2004    Left  . Colonoscopy [name] 07/22/2014    Procedure: COLONOSCOPY;  Surgeon: [name], MD;  Location: [facility];  Service: Endoscopy;  Laterality: [name];  1235-moved to [place]  notified pt     Allergies  Allergen Reactions  . Bidil [Isosorb Dinitrate-Hydralazine]     PASSED OUT BUT NOT SURE IF THIS WAS THE CAUSE      Family History  Problem Relation Age of Onset  . Heart attack Mother   . Stroke Father   . Diabetes Sister   . HIV Brother   . Alcohol abuse Brother   . Diabetes Sister   . Heart attack Son      Social History Mr. Snoke reports that he has quit smoking. His smokeless tobacco use includes Chew. Mr. Plemons reports that he does not drink alcohol.   Review of Systems CONSTITUTIONAL: No weight loss, fever, chills, weakness or fatigue.  HEENT: Eyes: No visual loss, blurred vision, double vision or yellow sclerae.No hearing loss, sneezing, congestion, runny nose or sore throat.  SKIN: No rash or itching.  CARDIOVASCULAR: per HPI RESPIRATORY: No shortness of breath, cough or sputum.  GASTROINTESTINAL: No  anorexia, nausea, vomiting or diarrhea. No abdominal pain or blood.  GENITOURINARY: No burning on urination, no polyuria NEUROLOGICAL: No headache, dizziness, syncope, paralysis, ataxia, numbness or tingling in the extremities. No change in bowel or bladder control.  MUSCULOSKELETAL: hip pain LYMPHATICS: No enlarged nodes. No history of splenectomy.  PSYCHIATRIC: No history of depression or anxiety.  ENDOCRINOLOGIC: No reports of sweating, cold or heat intolerance. No polyuria or polydipsia.  [name]   Physical Examination Filed Vitals:   08/25/15 1310  BP: 134/67  Pulse: 54   Filed Vitals:   08/25/15 1310  Height: 5\' 6"  (1.676 m)  Weight: 188 lb (85.276 kg)    Gen: resting comfortably, no acute distress HEENT: no scleral icterus, pupils equal round and reactive, no palptable cervical adenopathy,  CV: RRR, 2/6 sysotlic murmur at apex, no jvd. Bilateral carotid bruits Resp: Clear to auscultation bilaterally GI: abdomen is soft, non-tender, non-distended, normal bowel sounds, no hepatosplenomegaly MSK: extremities are warm, no edema.  Skin: warm, no rash Neuro:  no focal deficits Psych: appropriate affect   Diagnostic Studies  02/2001 Cath DESCRIPTION OF PROCEDURE: The procedure was performed from the right femoral artery using 6 French catheters. He tolerated the procedure without complication. He was taken to the holding area in satisfactory clinical condition.  HEMODYNAMICS: The central aortic pressure was 177/91. LV pressure 164/12. There was about a 5 mm gradient on pullback across the aortic valve.  ANGIOGRAPHIC DATA: On plain fluoroscopy there was extensive calcification of the aortic root. There was also subclavian calcification and coronary calcification.  The left main coronary artery was short and free of critical disease, although heavily calcified.  The LAD demonstrates an approximate 80% proximal stenosis. This is best seen in terms of grading on an  LAO caudle view. In the mid vessel there is 80 and 90% narrowing over a segmental area. The first diagonal has a 50% proximal and 80% mid stenosis and the second diagonal has about an 80% stenosis. The distal vessel in the LAD is suitable for grafting.  The circumflex has about 80% narrowing in the proximal segmental area, followed by the origin of a large marginal branch that has at least three sub-branches. There is diffuse disease of 80 and 70% across the first two sub-branches and subtotal occlusion leading into the distal branches.  The right coronary artery is subtotally occluded with slow flow distally and fills by retrograde collaterals.  The subclavian is diseased as well. There does not appear to be flow-limiting obstruction but there appears to  be a dissection of the main lumen. There is also some suggestion of narrowing of the mammary at the ostium of about 50%.  VENTRICULOGRAPHY: Ventriculography done in the RAO projection reveals hypokinesis of the inferobasal segment. Overall LV function is reduced and will be estimated at 45%.  CONCLUSIONS: 1. Moderate reduction in global left ventricular function with an inferobasal  wall motion abnormality. 2. Severe three-vessel coronary artery disease. 3. Moderate disease of the subclavian.   06/2013 Echo Study Conclusions  - Left ventricle: The cavity size was mildly dilated. There was mild to moderate concentric hypertrophy. Systolic function was normal. The estimated ejection fraction was in the range of 55% to 60%. Wall motion was normal; there were no regional wall motion abnormalities. - Aortic valve: Mildly calcified annulus. Trileaflet. - Mitral valve: Mild prolapse, involving the posterior leaflet. Mild to moderate regurgitation-eccentric anteromedially directed jet. - Left atrium: The atrium was mildly to moderately dilated. - Atrial septum: No defect or patent foramen ovale  was identified. Impressions:  - Compared to the prior study performed 10/25/11, there has been no significant interval change.  02/2010 Carotid US IMPRESSION: Extensive atherosclerotic plaque formation throughout carotid systems bilaterally, greater on left. Significant turbulence and increased velocity flow in left ECA compatible with stenosis, with evidence of increase in peak systolic velocity in the left ECA since previous exam. No additional hemodynamically significant stenoses identified.   Assessment and Plan  1. CAD - no current chest pain, continue current meds  2. PAD - no current symptoms - continue current meds  3. HTN - at goal, continue current meds  4. Hyperlipidemia - in setting of CAD will change to high dose statin atorva 80mg  daily.   5. Carotid stenosis - significant bruits on exam, last study in 2011. Will order carotid US  6. Mitral regurgitation - mild to moderate by echo in 2015. Will repeat echo to evaluate for progression.    F/u 6 months   Arnoldo Lenis, M.D.

## 2015-08-31 ENCOUNTER — Other Ambulatory Visit: Payer: Self-pay

## 2015-08-31 ENCOUNTER — Ambulatory Visit (INDEPENDENT_AMBULATORY_CARE_PROVIDER_SITE_OTHER): Payer: Medicare Other

## 2015-08-31 DIAGNOSIS — I340 Nonrheumatic mitral (valve) insufficiency: Secondary | ICD-10-CM

## 2015-08-31 DIAGNOSIS — I6529 Occlusion and stenosis of unspecified carotid artery: Secondary | ICD-10-CM

## 2015-09-08 ENCOUNTER — Telehealth: Payer: Self-pay | Admitting: *Deleted

## 2015-09-08 NOTE — Telephone Encounter (Signed)
-----   Message from Arnoldo Lenis, MD sent at 09/07/2015  4:16 PM EDT ----- Echo shows that his heart valve continues to be leaky. I'd like him to come back in 6 weeks to discuss in more detail and decide if any further testing is needed  Zandra Abts MD

## 2015-09-08 NOTE — Telephone Encounter (Signed)
Pt aware and scheduled for 10/13/15 for f/u. Routed to pcp

## 2015-09-19 ENCOUNTER — Telehealth: Payer: Self-pay

## 2015-09-19 NOTE — Telephone Encounter (Signed)
Dr. Vickey Sages nurse called and wanted to know why the Pravastatin was stopped and the Atorvastatin was started on the PT.

## 2015-09-20 NOTE — Telephone Encounter (Signed)
Faxed a copy of note to Dr. Vickey Sages office: ATTN-PAT

## 2015-09-20 NOTE — Telephone Encounter (Signed)
2013 ACC/AHA lipid guidelines recommend high dose statin (atorvastatin 80 or crestor 20mg ) for patients with known coronary artery disease despite there lipid levels.   Zandra Abts MD

## 2015-10-13 ENCOUNTER — Ambulatory Visit (INDEPENDENT_AMBULATORY_CARE_PROVIDER_SITE_OTHER): Payer: Medicare Other | Admitting: Cardiology

## 2015-10-13 ENCOUNTER — Encounter: Payer: Self-pay | Admitting: Cardiology

## 2015-10-13 VITALS — BP 150/64 | HR 52 | Ht 66.0 in | Wt 195.0 lb

## 2015-10-13 DIAGNOSIS — Z23 Encounter for immunization: Secondary | ICD-10-CM | POA: Diagnosis not present

## 2015-10-13 DIAGNOSIS — I340 Nonrheumatic mitral (valve) insufficiency: Secondary | ICD-10-CM | POA: Diagnosis not present

## 2015-10-13 NOTE — Progress Notes (Signed)
Patient ID: Marcus JAWAD Sr., male   DOB: [birth year], 74 y.o.   MRN: [id number]     Clinical Summary Marcus Hendrix is a 74 y.o.male seen today for follow up of the following medical problems. This is a focused visit on his history of mitral regurgitation, for more detailed history please refer to prior notes.    1. Mitral regurgitation - mild to moderate by TTE 06/2013, posterior leaflet prolapse.  - echo 08/2015 moderate to severe MR. LVEF 55-60%, LVIDs 40, PASP 46. MR is eccentric and may be underestimated.  - stable DOE, no significant edema.  Past Medical History  Diagnosis Date  . History of atherosclerotic cardiovascular disease   . History of myocardial infarction   . PVD (peripheral vascular disease)   . Hypertension   . Hyperlipidemia   . Diabetes mellitus   . Cerebrovascular disease   . Tubulovillous adenoma of colon   . ED (erectile dysfunction)   . Inguinal hernia     Right  . Normocytic anemia   . Glaucoma, left eye   . Peripheral neuropathy   . Diabetes mellitus      Allergies  Allergen Reactions  . Bidil [Isosorb Dinitrate-Hydralazine]     PASSED OUT BUT NOT SURE IF THIS WAS THE CAUSE     Current Outpatient Prescriptions  Medication Sig Dispense Refill  . amLODipine (NORVASC) 10 MG tablet Take 10 mg by mouth daily.      [name] aspirin 81 MG tablet Take 81 mg by mouth daily.    [name] atorvastatin (LIPITOR) 80 MG tablet Take 1 tablet (80 mg total) by mouth daily. 90 tablet 3  . doxazosin (CARDURA) 4 MG tablet Take 4 mg by mouth every evening.    . ferrous sulfate 325 (65 FE) MG tablet Take 325 mg by mouth daily.     . furosemide (LASIX) 40 MG tablet Take 40 mg by mouth daily.      [name] gabapentin (NEURONTIN) 300 MG capsule Take 300 mg by mouth. One in am and two at HS    . insulin glargine (LANTUS) 100 UNIT/ML injection Inject 70 Units into the skin every morning.     [name] lisinopril (PRINIVIL,ZESTRIL) 20 MG tablet TAKE ONE TABLET BY MOUTH TWICE DAILY. (Patient taking  differently: TAKE ONE TABLET BY MOUTH DAILY) 90 tablet 3  . metoprolol (LOPRESSOR) 100 MG tablet Take 100 mg by mouth 2 (two) times daily.      . potassium chloride SA (K-DUR,KLOR-CON) 20 MEQ tablet Take 20 mEq by mouth daily.    . timolol (TIMOPTIC) 0.25 % ophthalmic solution Place 1 drop into both eyes every morning.     . vitamin B-12 (CYANOCOBALAMIN) 1000 MCG tablet Take 2,000 mcg by mouth daily.     No current facility-administered medications for this visit.     Past Surgical History  Procedure Laterality Date  . Coronary artery bypass graft  2002    x6  . Cholecystectomy    . Inguinal hernia repair  2004    Left  . Colonoscopy [name] 07/22/2014    Procedure: COLONOSCOPY;  Surgeon: [name], MD;  Location: [facility];  Service: Endoscopy;  Laterality: [name];  1235-moved to [place] notified pt     Allergies  Allergen Reactions  . Bidil [Isosorb Dinitrate-Hydralazine]     PASSED OUT BUT NOT SURE IF THIS WAS THE CAUSE      Family History  Problem Relation Age of Onset  . Heart attack Mother   .  Stroke Father   . Diabetes Sister   . HIV Brother   . Alcohol abuse Brother   . Diabetes Sister   . Heart attack Son      Social History Marcus Hendrix reports that he has quit smoking. His smokeless tobacco use includes Chew. Marcus Hendrix reports that he does not drink alcohol.   Review of Systems CONSTITUTIONAL: No weight loss, fever, chills, weakness or fatigue.  HEENT: Eyes: No visual loss, blurred vision, double vision or yellow sclerae.No hearing loss, sneezing, congestion, runny nose or sore throat.  SKIN: No rash or itching.  CARDIOVASCULAR: no chest pain, no palpitations RESPIRATORY: No cough or sputum.  GASTROINTESTINAL: No anorexia, nausea, vomiting or diarrhea. No abdominal pain or blood.  GENITOURINARY: No burning on urination, no polyuria NEUROLOGICAL: No headache, dizziness, syncope, paralysis, ataxia, numbness or tingling in the extremities. No change  in bowel or bladder control.  MUSCULOSKELETAL: No muscle, back pain, joint pain or stiffness.  LYMPHATICS: No enlarged nodes. No history of splenectomy.  PSYCHIATRIC: No history of depression or anxiety.  ENDOCRINOLOGIC: No reports of sweating, cold or heat intolerance. No polyuria or polydipsia.  [name]   Physical Examination Filed Vitals:   10/13/15 0816  BP: 150/64  Pulse: 52   Filed Vitals:   10/13/15 0816  Height: 5\' 6"  (1.676 m)  Weight: 195 lb (88.451 kg)    Gen: resting comfortably, no acute distress HEENT: no scleral icterus, pupils equal round and reactive, no palptable cervical adenopathy,  CV: RRR, 3/6 systolic murmur at apex, no jvd Resp: Clear to auscultation bilaterally GI: abdomen is soft, non-tender, non-distended, normal bowel sounds, no hepatosplenomegaly MSK: extremities are warm, no edema.  Skin: warm, no rash Neuro:  no focal deficits Psych: appropriate affect   Diagnostic Studies  02/2001 Cath DESCRIPTION OF PROCEDURE: The procedure was performed from the right femoral artery using 6 French catheters. He tolerated the procedure without complication. He was taken to the holding area in satisfactory clinical condition.  HEMODYNAMICS: The central aortic pressure was 177/91. LV pressure 164/12. There was about a 5 mm gradient on pullback across the aortic valve.  ANGIOGRAPHIC DATA: On plain fluoroscopy there was extensive calcification of the aortic root. There was also subclavian calcification and coronary calcification.  The left main coronary artery was short and free of critical disease, although heavily calcified.  The LAD demonstrates an approximate 80% proximal stenosis. This is best seen in terms of grading on an LAO caudle view. In the mid vessel there is 80 and 90% narrowing over a segmental area. The first diagonal has a 50% proximal and 80% mid stenosis and the second diagonal has about an 80% stenosis. The distal vessel in  the LAD is suitable for grafting.  The circumflex has about 80% narrowing in the proximal segmental area, followed by the origin of a large marginal Aerabella Galasso that has at least three sub-branches. There is diffuse disease of 80 and 70% across the first two sub-branches and subtotal occlusion leading into the distal branches.  The right coronary artery is subtotally occluded with slow flow distally and fills by retrograde collaterals.  The subclavian is diseased as well. There does not appear to be flow-limiting obstruction but there appears to be a dissection of the main lumen. There is also some suggestion of narrowing of the mammary at the ostium of about 50%.  VENTRICULOGRAPHY: Ventriculography done in the RAO projection reveals hypokinesis of the inferobasal segment. Overall LV function is reduced and will be estimated  at 45%.  CONCLUSIONS: 1. Moderate reduction in global left ventricular function with an inferobasal  wall motion abnormality. 2. Severe three-vessel coronary artery disease. 3. Moderate disease of the subclavian.   06/2013 Echo Study Conclusions  - Left ventricle: The cavity size was mildly dilated. There was mild to moderate concentric hypertrophy. Systolic function was normal. The estimated ejection fraction was in the range of 55% to 60%. Wall motion was normal; there were no regional wall motion abnormalities. - Aortic valve: Mildly calcified annulus. Trileaflet. - Mitral valve: Mild prolapse, involving the posterior leaflet. Mild to moderate regurgitation-eccentric anteromedially directed jet. - Left atrium: The atrium was mildly to moderately dilated. - Atrial septum: No defect or patent foramen ovale was identified. Impressions:  - Compared to the prior study performed 10/25/11, there has been no significant interval change.  02/2010 Carotid US IMPRESSION: Extensive atherosclerotic plaque formation throughout  carotid systems bilaterally, greater on left. Significant turbulence and increased velocity flow in left ECA compatible with stenosis, with evidence of increase in peak systolic velocity in the left ECA since previous exam. No additional hemodynamically significant stenoses identified.  08/2015 echo Study Conclusions  - Left ventricle: The cavity size was normal. Wall thickness was increased in a pattern of mild LVH. Systolic function was normal. The estimated ejection fraction was in the range of 55% to 60%. Wall motion was normal; there were no regional wall motion abnormalities. Internal dimension, ES (PLAX chordal): 40 mm. - Aortic valve: Valve area (VTI): 2.98 cm^2. Valve area (Vmax): 2.42 cm^2. Valve area (Vmean): 2.48 cm^2. - Mitral valve: Mildly calcified annulus. Normal thickness leaflets . There is prolapse of a portion of the mitral valve. The MR regurgitant jet is eccentric and anteriorally directed. The eccentricity of the jet limits the evaluation of severity. Visible vena contracta is 0.6 cm suggesting moderate to severe MR. The MV to AV TVI ratio is 1.3 also supporting moderate to severe MR. - Left atrium: The atrium was severely dilated. - Right atrium: The atrium was moderately dilated. - Atrial septum: No defect or patent foramen ovale was identified. - Pulmonary arteries: Systolic pressure was moderately increased. PA peak pressure: 46 mm Hg (S). - Technically adequate study.  08/2015 Carotid US  Assessment and Plan   1. Mitral regurgitation - discussed TEE to further evaluate, however he is not interested at this time. He states he would not consider any form of intervention on his valve at any time - continue diuretic for symptoms  F/u 6 months       Arnoldo Lenis, M.D.

## 2015-10-13 NOTE — Patient Instructions (Signed)
Continue all current medications. Your physician wants you to follow up in: 6 months.  You will receive a reminder letter in the mail one-two months in advance.  If you don't receive a letter, please call our office to schedule the follow up appointment   

## 2015-10-18 ENCOUNTER — Telehealth: Payer: Self-pay | Admitting: *Deleted

## 2015-10-18 NOTE — Telephone Encounter (Signed)
-----   Message from Arnoldo Lenis, MD sent at 10/16/2015 11:18 AM EDT ----- Carotid US shows mild to mod blockages on both sides. We will continue to monitor w/ [place] next year.   J BrancH MD

## 2015-10-18 NOTE — Telephone Encounter (Signed)
Notes Recorded by Laurine Blazer, LPN on 08/24/8337 at 25:05 AM Patient notified. Copy fwd to pmd.

## 2016-03-10 IMAGING — CR DG LUMBAR SPINE COMPLETE 4+V
5 series · 5 of 5 positions shown · non-contrast
Comparison: CT 10/16/2007

CLINICAL DATA: Sciatica.

EXAM:
LUMBAR SPINE - COMPLETE 4+ VIEW

[view not recorded (1 of 5)]
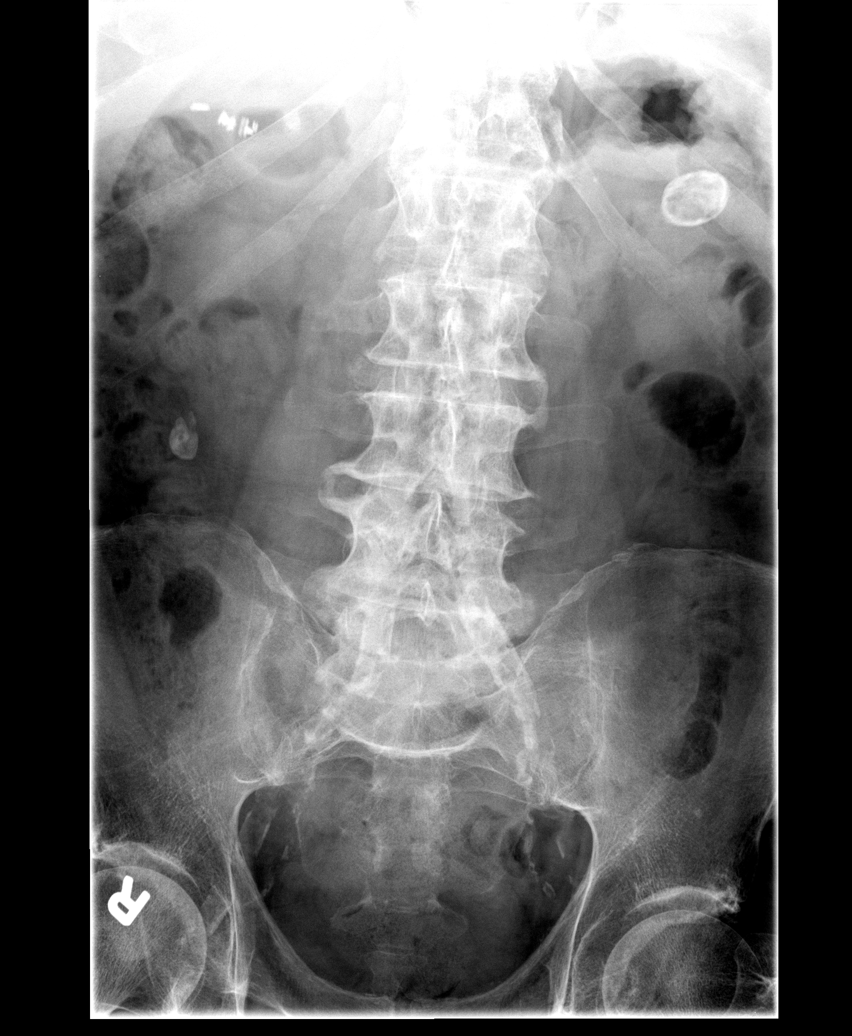

[view not recorded (2 of 5)]
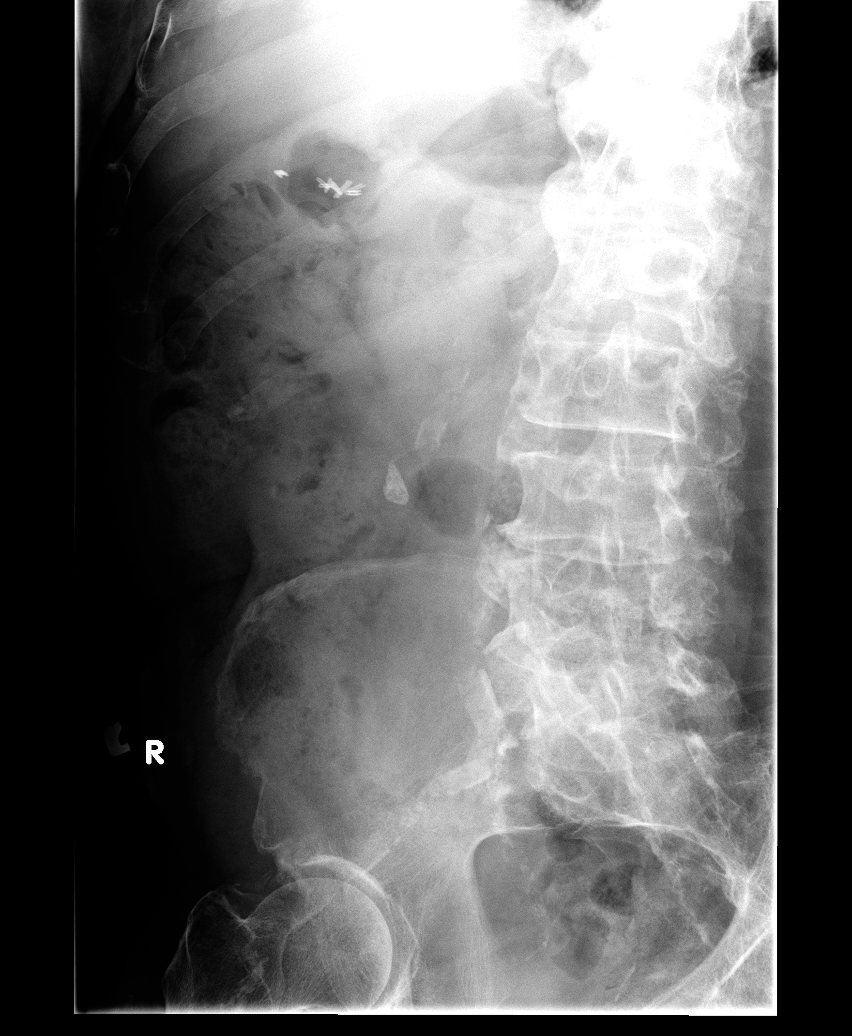

[view not recorded (3 of 5)]
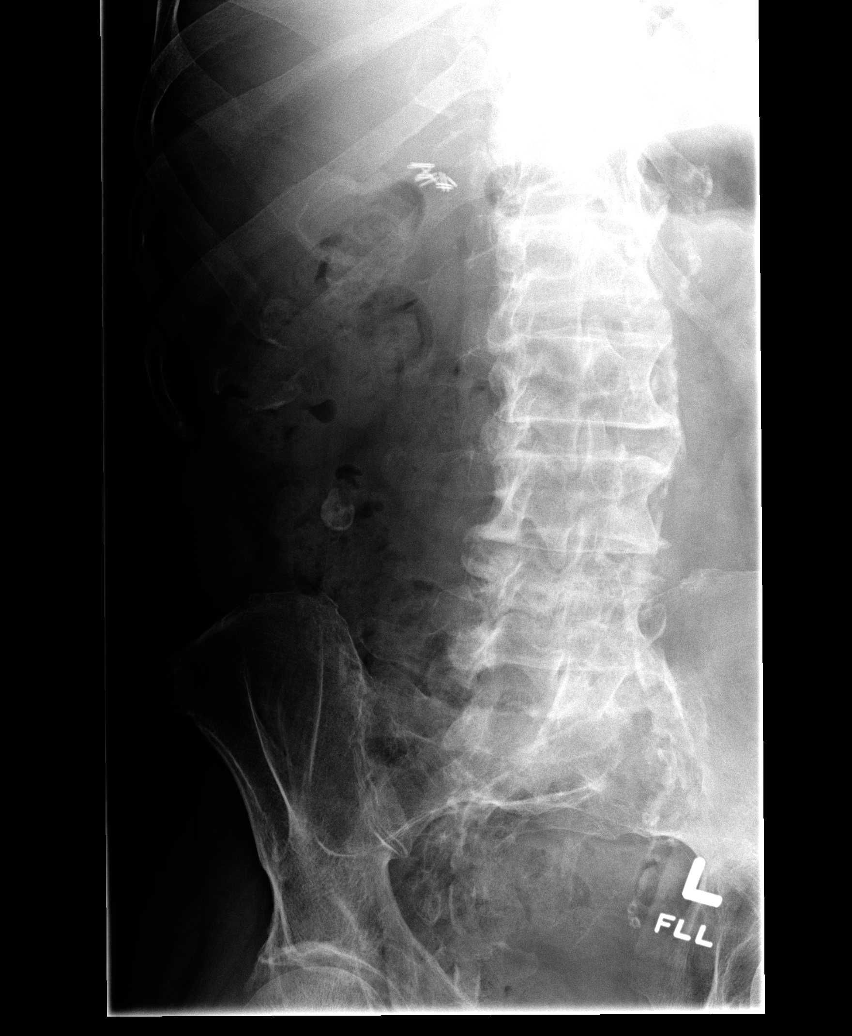

[view not recorded (4 of 5)]
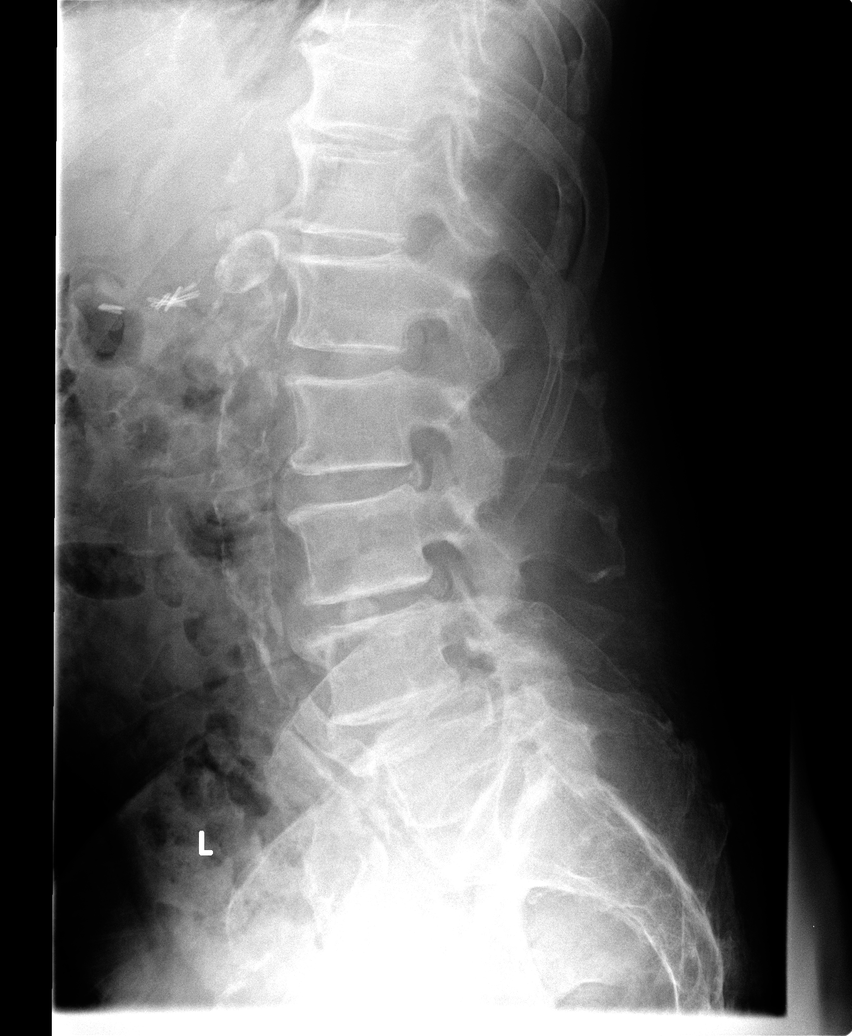

[view not recorded (5 of 5)]
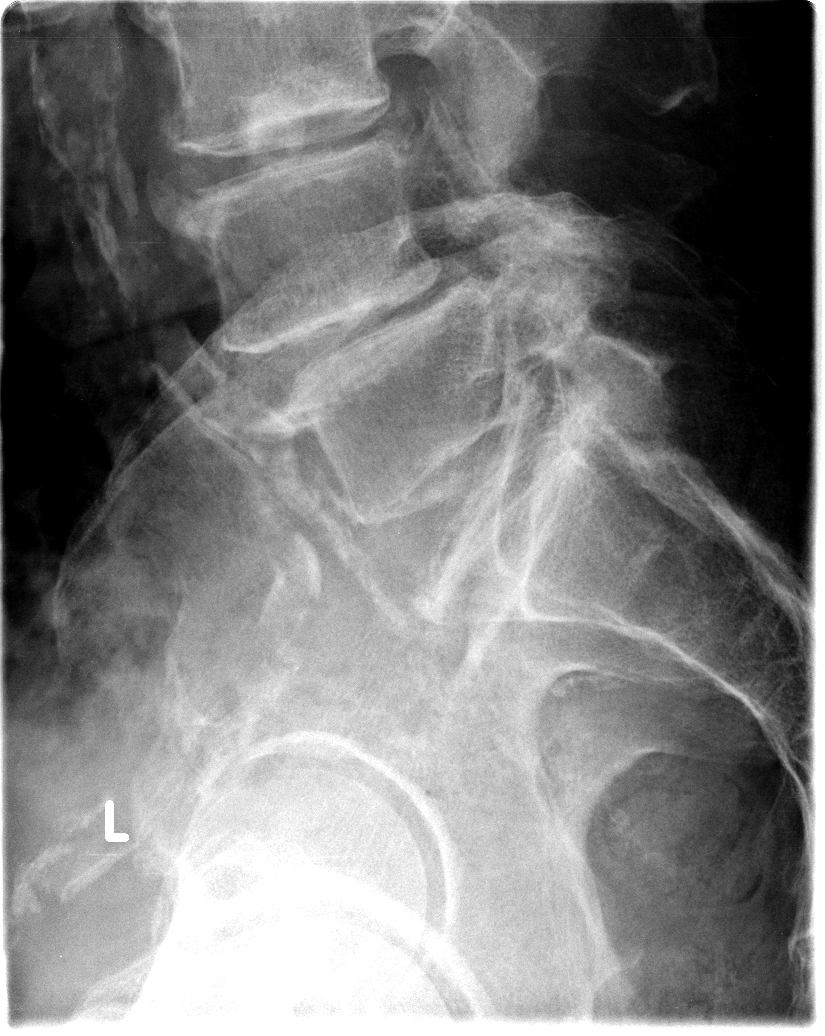

[5 of 5 positions shown; findings below may reference images not displayed]

FINDINGS: Again noted are large calcifications in the left upper abdomen and
right lateral abdomen. Atherosclerotic calcifications in the iliac
arteries. There is stable grade 1 anterolisthesis at L4-L5 with disc
space narrowing at L4-L5. The vertebral body heights are maintained.
Multiple levels of endplate degenerative disease. Facet arthropathy
at L4-L5.
IMPRESSION: No acute bone abnormality in the lumbar spine.

Multilevel degenerative changes.

Stable anterolisthesis at L4-L5 which is likely secondary to facet
disease.

## 2016-05-22 ENCOUNTER — Emergency Department (HOSPITAL_COMMUNITY): Payer: Medicare Other

## 2016-05-22 ENCOUNTER — Emergency Department (HOSPITAL_COMMUNITY)
Admission: EM | Admit: 2016-05-22 | Discharge: 2016-05-22 | Disposition: A | Discharge status: Home / Self Care | Payer: Medicare Other | Attending: Emergency Medicine | Admitting: Emergency Medicine

## 2016-05-22 ENCOUNTER — Encounter (HOSPITAL_COMMUNITY): Payer: Self-pay | Admitting: Emergency Medicine

## 2016-05-22 DIAGNOSIS — Z87891 Personal history of nicotine dependence: Secondary | ICD-10-CM | POA: Insufficient documentation

## 2016-05-22 DIAGNOSIS — Z794 Long term (current) use of insulin: Secondary | ICD-10-CM | POA: Insufficient documentation

## 2016-05-22 DIAGNOSIS — J40 Bronchitis, not specified as acute or chronic: Secondary | ICD-10-CM | POA: Diagnosis not present

## 2016-05-22 DIAGNOSIS — I10 Essential (primary) hypertension: Secondary | ICD-10-CM | POA: Insufficient documentation

## 2016-05-22 DIAGNOSIS — R531 Weakness: Secondary | ICD-10-CM | POA: Diagnosis present

## 2016-05-22 DIAGNOSIS — J069 Acute upper respiratory infection, unspecified: Secondary | ICD-10-CM | POA: Diagnosis not present

## 2016-05-22 DIAGNOSIS — J020 Streptococcal pharyngitis: Secondary | ICD-10-CM

## 2016-05-22 DIAGNOSIS — E1151 Type 2 diabetes mellitus with diabetic peripheral angiopathy without gangrene: Secondary | ICD-10-CM | POA: Insufficient documentation

## 2016-05-22 DIAGNOSIS — E785 Hyperlipidemia, unspecified: Secondary | ICD-10-CM | POA: Diagnosis not present

## 2016-05-22 DIAGNOSIS — Z79899 Other long term (current) drug therapy: Secondary | ICD-10-CM | POA: Insufficient documentation

## 2016-05-22 LAB — RAPID STREP SCREEN (MED CTR MEBANE ONLY): Streptococcus, Group A Screen (Direct): POSITIVE — AB

## 2016-05-22 LAB — BASIC METABOLIC PANEL
Anion gap: 3 — ABNORMAL LOW (ref 5–15)
BUN: 18 mg/dL (ref 6–20)
CALCIUM: 8.4 mg/dL — AB (ref 8.9–10.3)
CHLORIDE: 105 mmol/L (ref 101–111)
CO2: 28 mmol/L (ref 22–32)
CREATININE: 1.04 mg/dL (ref 0.61–1.24)
Glucose, Bld: 222 mg/dL — ABNORMAL HIGH (ref 65–99)
Potassium: 4.6 mmol/L (ref 3.5–5.1)
SODIUM: 136 mmol/L (ref 135–145)

## 2016-05-22 LAB — CBC WITH DIFFERENTIAL/PLATELET
BASOS PCT: 0 %
Basophils Absolute: 0 10*3/uL (ref 0.0–0.1)
EOS ABS: 0.4 10*3/uL (ref 0.0–0.7)
Eosinophils Relative: 4 %
HCT: 36 % — ABNORMAL LOW (ref 39.0–52.0)
HEMOGLOBIN: 11.8 g/dL — AB (ref 13.0–17.0)
Lymphocytes Relative: 23 %
Lymphs Abs: 2.4 10*3/uL (ref 0.7–4.0)
MCH: 31 pg (ref 26.0–34.0)
MCHC: 32.8 g/dL (ref 30.0–36.0)
MCV: 94.5 fL (ref 78.0–100.0)
MONOS PCT: 8 %
Monocytes Absolute: 0.9 10*3/uL (ref 0.1–1.0)
NEUTROS PCT: 65 %
Neutro Abs: 6.6 10*3/uL (ref 1.7–7.7)
Platelets: 150 10*3/uL (ref 150–400)
RBC: 3.81 MIL/uL — ABNORMAL LOW (ref 4.22–5.81)
RDW: 12.9 % (ref 11.5–15.5)
WBC: 10.2 10*3/uL (ref 4.0–10.5)

## 2016-05-22 MED ORDER — DM-GUAIFENESIN ER 30-600 MG PO TB12: 1.0000 | ORAL_TABLET | Freq: Two times a day (BID) | ORAL | Status: DC

## 2016-05-22 MED ORDER — PENICILLIN V POTASSIUM 250 MG PO TABS
ORAL_TABLET | ORAL | Status: AC
  Filled 2016-05-22: qty 1

## 2016-05-22 MED ORDER — PENICILLIN V POTASSIUM 250 MG PO TABS
500.0000 mg | ORAL_TABLET | Freq: Once | ORAL | Status: AC
  Administered 2016-05-22: 500 mg via ORAL
  Filled 2016-05-22: qty 2

## 2016-05-22 MED ORDER — ALBUTEROL SULFATE HFA 108 (90 BASE) MCG/ACT IN AERS
2.0000 | INHALATION_SPRAY | Freq: Four times a day (QID) | RESPIRATORY_TRACT | Status: DC
  Administered 2016-05-22: 2 via RESPIRATORY_TRACT
  Filled 2016-05-22: qty 6.7

## 2016-05-22 MED ORDER — PENICILLIN V POTASSIUM 500 MG PO TABS: 500.0000 mg | ORAL_TABLET | Freq: Four times a day (QID) | ORAL | Status: DC

## 2016-05-22 MED ORDER — IPRATROPIUM-ALBUTEROL 0.5-2.5 (3) MG/3ML IN SOLN
3.0000 mL | Freq: Once | RESPIRATORY_TRACT | Status: AC
  Administered 2016-05-22: 3 mL via RESPIRATORY_TRACT
  Filled 2016-05-22: qty 3

## 2016-05-22 NOTE — ED Notes (Signed)
Pt c/o wheezing, nasal congestion, unsteady gait, but denies any dizziness. Pt states it has been going on for a few days.

## 2016-05-22 NOTE — Discharge Instructions (Signed)
How to Use an Inhaler Using your inhaler correctly is very important. Good technique will make sure that the medicine reaches your lungs.  HOW TO USE AN INHALER: 1. Take the cap off the inhaler. 2. If this is the first time using your inhaler, you need to prime it. Shake the inhaler for 5 seconds. Release four puffs into the air, away from your face. Ask your doctor for help if you have questions. 3. Shake the inhaler for 5 seconds. 4. Turn the inhaler so the bottle is above the mouthpiece. 5. Put your pointer finger on top of the bottle. Your thumb holds the bottom of the inhaler. 6. Open your mouth. 7. Either hold the inhaler away from your mouth (the width of 2 fingers) or place your lips tightly around the mouthpiece. Ask your doctor which way to use your inhaler. 8. Breathe out as much air as possible. 9. Breathe in and push down on the bottle 1 time to release the medicine. You will feel the medicine go in your mouth and throat. 10. Continue to take a deep breath in very slowly. Try to fill your lungs. 11. After you have breathed in completely, hold your breath for 10 seconds. This will help the medicine to settle in your lungs. If you cannot hold your breath for 10 seconds, hold it for as long as you can before you breathe out. 12. Breathe out slowly, through pursed lips. Whistling is an example of pursed lips. 13. If your doctor has told you to take more than 1 puff, wait at least 15-30 seconds between puffs. This will help you get the best results from your medicine. Do not use the inhaler more than your doctor tells you to. 14. Put the cap back on the inhaler. 15. Follow the directions from your doctor or from the inhaler package about cleaning the inhaler. If you use more than one inhaler, ask your doctor which inhalers to use and what order to use them in. Ask your doctor to help you figure out when you will need to refill your inhaler.  If you use a steroid inhaler, always rinse your  mouth with water after your last puff, gargle and spit out the water. Do not swallow the water. GET HELP IF:  The inhaler medicine only partially helps to stop wheezing or shortness of breath.  You are having trouble using your inhaler.  You have some increase in thick spit (phlegm). GET HELP RIGHT AWAY IF:  The inhaler medicine does not help your wheezing or shortness of breath or you have tightness in your chest.  You have dizziness, headaches, or fast heart rate.  You have chills, fever, or night sweats.  You have a large increase of thick spit, or your thick spit is bloody. MAKE SURE YOU:   Understand these instructions.  Will watch your condition.  Will get help right away if you are not doing well or get worse.   This information is not intended to replace advice given to you by your health care provider. Make sure you discuss any questions you have with your health care provider.   Take antibiotic penicillin as directed for the next 7 days. Take Mucinex DM for cough and phlegm. Use albuterol inhaler 2 puffs every 6 hours for the next 7 days. Return for any new or worse symptoms. Make an appointment to follow-up with your regular doctor.   Document Released: 09/10/2008 Document Revised: 09/22/2013 Document Reviewed: 07/01/2013 Elsevier Interactive Patient Education  Education ©2016 [employer] ° °

## 2016-05-22 NOTE — ED Provider Notes (Signed)
CSN: [id number]     Arrival date & time 05/22/16  1905 History   By signing my name below, I, Hansel Feinstein and Tad Moore, attest that this documentation has been prepared under the direction and in the presence of Fredia Sorrow, MD. Electronically Signed: Hansel Feinstein and Tad Moore, ED Scribe. 05/22/2016. 9:03 PM.     Chief Complaint  Patient presents with  . Weakness    Patient is a 75 y.o. male presenting with URI. The history is provided by the patient. No language interpreter was used.  URI Presenting symptoms: congestion, cough, fever, rhinorrhea and sore throat   Congestion:    Location:  Chest Cough:    Cough characteristics:  Non-productive   Severity:  Moderate   Onset quality:  Gradual   Duration:  3 days   Timing:  Intermittent   Progression:  Worsening   Chronicity:  New Fever:    Temp source:  Subjective   Progression:  Worsening Onset quality:  Gradual Duration:  3 days Timing:  Constant Progression:  Worsening Chronicity:  New Relieved by:  Nothing Associated symptoms: wheezing   Associated symptoms: no headaches and no neck pain   Risk factors: no chronic respiratory disease and no sick contacts    HPI Comments: Marcus Hendrix is a 75 y.o. male who presents to the Emergency Department complaining of gradual onset, constant, moderate chest congestion onset 3 days ago and worsened today. Pt reports associated symptoms of wheezing, generalized weakness, rhinorrhea, non-productive cough, subjective fever, chills, sore throat, SOB and constipation. The  patient does not list any modifying factors. Pt reports taking aspirin daily, but is not on any other anticoagulants. He also reports no sick contacts at home. The pt denies nausea, vomiting, diarrhea, HA, CP, abdominal pain, dysuria, hematuria, leg swelling, back pain, worsened visual disturbance from baseline. The patient denies a history of emphysema or COPD.   Past Medical History  Diagnosis Date  .  History of atherosclerotic cardiovascular disease   . History of myocardial infarction   . PVD (peripheral vascular disease) ([place])   . Hypertension   . Hyperlipidemia   . Diabetes mellitus   . Cerebrovascular disease   . Tubulovillous adenoma of colon   . ED (erectile dysfunction)   . Inguinal hernia     Right  . Normocytic anemia   . Glaucoma, left eye   . Peripheral neuropathy ([place])   . Diabetes mellitus [facility])    Past Surgical History  Procedure Laterality Date  . Coronary artery bypass graft  2002    x6  . Cholecystectomy    . Inguinal hernia repair  2004    Left  . Colonoscopy [name] 07/22/2014    Procedure: COLONOSCOPY;  Surgeon: [name], MD;  Location: [facility];  Service: Endoscopy;  Laterality: [name];  [id number]-[id number] to 67 Ann notified pt   Family History  Problem Relation Age of Onset  . Heart attack Mother   . Stroke Father   . Diabetes Sister   . HIV Brother   . Alcohol abuse Brother   . Diabetes Sister   . Heart attack Son    Social History  Substance Use Topics  . Smoking status: Former Smoker -- 1.00 packs/day for 41 years    Types: Cigarettes    Start date: 02/06/1957    Quit date: 12/16/1997  . Smokeless tobacco: Current User    Types: Chew     Comment: quit 12 yrs ago--10/13/15 currently a tobacco chewer  for the past 25 years  . Alcohol Use: No    Review of Systems  Constitutional: Positive for fever and chills.  HENT: Positive for congestion, rhinorrhea and sore throat.   Eyes: Positive for visual disturbance (baseline ).  Respiratory: Positive for cough, shortness of breath and wheezing.   Cardiovascular: Negative for chest pain and leg swelling.  Gastrointestinal: Positive for constipation. Negative for nausea, vomiting, abdominal pain and diarrhea.  Genitourinary: Negative for dysuria and hematuria.  Musculoskeletal: Negative for back pain and neck pain.  Skin: Negative for rash.  Neurological: Positive for weakness (generalized).  Negative for headaches.  Hematological: Does not bruise/bleed easily.  Psychiatric/Behavioral: Negative for confusion.    Allergies  Bidil  Home Medications   Prior to Admission medications   Medication Sig Start Date End Date Taking? Authorizing Provider  amLODipine (NORVASC) 10 MG tablet Take 10 mg by mouth daily.     Yes Historical Provider, MD  aspirin 81 MG tablet Take 81 mg by mouth daily.   Yes Historical Provider, MD  atorvastatin (LIPITOR) 80 MG tablet Take 1 tablet (80 mg total) by mouth daily. 08/25/15  Yes Arnoldo Lenis, MD  doxazosin (CARDURA) 4 MG tablet Take 4 mg by mouth every evening.   Yes Historical Provider, MD  furosemide (LASIX) 40 MG tablet Take 40 mg by mouth daily.     Yes Historical Provider, MD  gabapentin (NEURONTIN) 100 MG capsule Take 100-200 mg by mouth 2 (two) times daily.  04/15/16  Yes Historical Provider, MD  insulin glargine (LANTUS) 100 UNIT/ML injection Inject 60 Units into the skin every morning.    Yes Historical Provider, MD  lisinopril (PRINIVIL,ZESTRIL) 20 MG tablet TAKE ONE TABLET BY MOUTH TWICE DAILY. Patient taking differently: TAKE ONE TABLET BY MOUTH DAILY 01/18/14  Yes Lendon Colonel, NP  metoprolol (LOPRESSOR) 100 MG tablet Take 100 mg by mouth 2 (two) times daily.     Yes Historical Provider, MD  nitroGLYCERIN (NITROSTAT) 0.4 MG SL tablet Place 0.4 mg under the tongue every 5 (five) minutes as needed for chest pain.  04/15/16  Yes Historical Provider, MD  potassium chloride SA (K-DUR,KLOR-CON) 20 MEQ tablet Take 20 mEq by mouth daily.   Yes Historical Provider, MD  timolol (TIMOPTIC) 0.25 % ophthalmic solution Place 1 drop into both eyes every morning.    Yes Historical Provider, MD  vitamin B-12 (CYANOCOBALAMIN) 1000 MCG tablet Take 2,000 mcg by mouth daily.   Yes Historical Provider, MD  dextromethorphan-guaiFENesin (MUCINEX DM) 30-600 MG 12hr tablet Take 1 tablet by mouth 2 (two) times daily. 05/22/16   Fredia Sorrow, MD  penicillin v  potassium (VEETID) 500 MG tablet Take 1 tablet (500 mg total) by mouth 4 (four) times daily. 05/22/16   Fredia Sorrow, MD   BP 160/60 mmHg  Pulse 69  Temp(Src) 98.6 F (37 C) (Oral)  Resp 17  Ht 5\' 7"  (1.702 m)  Wt 86.183 kg  BMI 29.75 kg/m2  SpO2 94% Physical Exam  Constitutional: He is oriented to person, place, and time. He appears well-developed and well-nourished.  HENT:  Head: Normocephalic.  Mouth/Throat: Oropharynx is clear and moist. No oropharyngeal exudate.  Uvula midline  Eyes: Conjunctivae and EOM are normal. Pupils are equal, round, and reactive to light. No scleral icterus.  Cardiovascular: Normal rate, regular rhythm and normal heart sounds.   No murmur heard. Pulmonary/Chest: Effort normal and breath sounds normal. No respiratory distress. He has no wheezes.  Lungs CTA bilaterally. RA O2 sat  is 91%. Well healed median sternotomy scar  Abdominal: Bowel sounds are normal. He exhibits distension. There is no tenderness.  Musculoskeletal: Normal range of motion. He exhibits no edema.  Neurological: He is alert and oriented to person, place, and time. He has normal reflexes. No cranial nerve deficit. He exhibits normal muscle tone. Coordination normal.  Skin: Skin is warm and dry.  Psychiatric: He has a normal mood and affect. His behavior is normal.  Nursing note and vitals reviewed.   ED Course  Procedures (including critical care time) DIAGNOSTIC STUDIES: Oxygen Saturation is 100% on RA, normal by my interpretation.    COORDINATION OF CARE: 8:40 PM Discussed treatment plan with pt at bedside which includes lab work., CXR and pt agreed to plan.   Labs Review Labs Reviewed  RAPID STREP SCREEN (NOT AT [facility]) - Abnormal; Notable for the following:    Streptococcus, Group A Screen (Direct) POSITIVE (*)    All other components within normal limits  CBC WITH DIFFERENTIAL/PLATELET - Abnormal; Notable for the following:    RBC 3.81 (*)    Hemoglobin 11.8 (*)    HCT  36.0 (*)    All other components within normal limits  BASIC METABOLIC PANEL - Abnormal; Notable for the following:    Glucose, Bld 222 (*)    Calcium 8.4 (*)    Anion gap 3 (*)    All other components within normal limits    Imaging Review Dg Chest 2 View  05/22/2016  CLINICAL DATA:  Shortness of breath EXAM: CHEST  2 VIEW COMPARISON:  07/30/2013 FINDINGS: Cardiomegaly, stable. Status post median sternotomy. Stable aortic and hilar contours. Chronic scarring at the left base, including posterior costophrenic sulcus blunting. There is no edema, consolidation, effusion, or pneumothorax. 22 mm ovoid eggshell calcification in the left upper quadrant could be pseudocyst or pseudoaneurysm, appearance unchanged since at least 2006. IMPRESSION: No evidence of acute disease. Chronic cardiomegaly and lung scarring. Electronically Signed   By: Monte Fantasia M.D.   On: 05/22/2016 21:43   I have personally reviewed and evaluated these images and lab results as part of my medical decision-making.   EKG Interpretation   Date/Time:  Wednesday May 22 2016 19:22:45 EDT Ventricular Rate:  64 PR Interval:  219 QRS Duration: 107 QT Interval:  405 QTC Calculation: 418 R Axis:   77 Text Interpretation:  Sinus rhythm Borderline prolonged PR interval  Probable left atrial enlargement Left ventricular hypertrophy Confirmed by  Ebone Alcivar  MD, Salvador Bigbee ([id number]) on 05/22/2016 8:32:42 PM      MDM   Final diagnoses:  URI (upper respiratory infection)  Strep pharyngitis  Bronchitis     She has symptoms consistent with upper respiratory infection bronchitis. However rapid strep was positive for strep pharyngitis does have a sore throat. Will treat with penicillin first dose provided here. Will treat with albuterol inhaler patient has felt wheezy did not hear wheezing here but felt better after albuterol Atrovent nebulizer. Will also treat with Mucinex DM for the phlegm cough.  Chest x-rays negative for  pneumonia. EKG without any significant findings. Labs without significant abnormalities. Blood sugar was elevated but patient is a known diabetic. Blood sugar was below 250. Patient will be treated symptomatically with albuterol inhaler for the next 7 days Mucinex DM and penicillin.   I personally performed the services described in this documentation, which was scribed in my presence. The recorded information has been reviewed and is accurate.       Fredia Sorrow,  MD 05/22/16 2255

## 2016-05-22 NOTE — ED Notes (Signed)
Pt states understanding of care given and follow up instructions.  Ambulated from ED  

## 2016-12-17 ENCOUNTER — Other Ambulatory Visit: Payer: Self-pay | Admitting: Cardiology

## 2017-03-18 ENCOUNTER — Other Ambulatory Visit: Payer: Self-pay | Admitting: Cardiology

## 2017-06-17 ENCOUNTER — Other Ambulatory Visit: Payer: Self-pay | Admitting: Cardiology

## 2017-09-02 ENCOUNTER — Ambulatory Visit: Payer: Medicare Other | Admitting: General Surgery

## 2017-09-17 ENCOUNTER — Other Ambulatory Visit: Payer: Self-pay | Admitting: Cardiology

## 2017-11-28 ENCOUNTER — Emergency Department (HOSPITAL_COMMUNITY): Payer: Medicare Other

## 2017-11-28 ENCOUNTER — Observation Stay (HOSPITAL_COMMUNITY)
Admission: EM | Admit: 2017-11-28 | Discharge: 2017-11-29 | Disposition: A | Discharge status: Home / Self Care | Payer: Medicare Other | Attending: Family Medicine | Admitting: Family Medicine

## 2017-11-28 ENCOUNTER — Encounter (HOSPITAL_COMMUNITY): Payer: Self-pay | Admitting: Emergency Medicine

## 2017-11-28 ENCOUNTER — Other Ambulatory Visit: Payer: Self-pay

## 2017-11-28 DIAGNOSIS — I10 Essential (primary) hypertension: Secondary | ICD-10-CM | POA: Diagnosis present

## 2017-11-28 DIAGNOSIS — E11628 Type 2 diabetes mellitus with other skin complications: Secondary | ICD-10-CM | POA: Diagnosis not present

## 2017-11-28 DIAGNOSIS — Z794 Long term (current) use of insulin: Secondary | ICD-10-CM | POA: Diagnosis not present

## 2017-11-28 DIAGNOSIS — E785 Hyperlipidemia, unspecified: Secondary | ICD-10-CM | POA: Diagnosis present

## 2017-11-28 DIAGNOSIS — I110 Hypertensive heart disease with heart failure: Secondary | ICD-10-CM | POA: Insufficient documentation

## 2017-11-28 DIAGNOSIS — Z7982 Long term (current) use of aspirin: Secondary | ICD-10-CM | POA: Insufficient documentation

## 2017-11-28 DIAGNOSIS — Z79899 Other long term (current) drug therapy: Secondary | ICD-10-CM | POA: Diagnosis not present

## 2017-11-28 DIAGNOSIS — I252 Old myocardial infarction: Secondary | ICD-10-CM | POA: Insufficient documentation

## 2017-11-28 DIAGNOSIS — K4090 Unilateral inguinal hernia, without obstruction or gangrene, not specified as recurrent: Secondary | ICD-10-CM | POA: Insufficient documentation

## 2017-11-28 DIAGNOSIS — L089 Local infection of the skin and subcutaneous tissue, unspecified: Secondary | ICD-10-CM

## 2017-11-28 DIAGNOSIS — I509 Heart failure, unspecified: Secondary | ICD-10-CM | POA: Diagnosis not present

## 2017-11-28 DIAGNOSIS — R002 Palpitations: Secondary | ICD-10-CM | POA: Insufficient documentation

## 2017-11-28 DIAGNOSIS — I5041 Acute combined systolic (congestive) and diastolic (congestive) heart failure: Principal | ICD-10-CM | POA: Insufficient documentation

## 2017-11-28 DIAGNOSIS — Z87891 Personal history of nicotine dependence: Secondary | ICD-10-CM | POA: Diagnosis not present

## 2017-11-28 DIAGNOSIS — E119 Type 2 diabetes mellitus without complications: Secondary | ICD-10-CM

## 2017-11-28 DIAGNOSIS — R0602 Shortness of breath: Secondary | ICD-10-CM | POA: Diagnosis not present

## 2017-11-28 DIAGNOSIS — D649 Anemia, unspecified: Secondary | ICD-10-CM | POA: Diagnosis not present

## 2017-11-28 DIAGNOSIS — R1031 Right lower quadrant pain: Secondary | ICD-10-CM | POA: Diagnosis present

## 2017-11-28 LAB — COMPREHENSIVE METABOLIC PANEL
ALT: 48 U/L (ref 17–63)
AST: 41 U/L (ref 15–41)
Albumin: 3.7 g/dL (ref 3.5–5.0)
Alkaline Phosphatase: 120 U/L (ref 38–126)
Anion gap: 8 (ref 5–15)
BUN: 18 mg/dL (ref 6–20)
CHLORIDE: 103 mmol/L (ref 101–111)
CO2: 26 mmol/L (ref 22–32)
Calcium: 8.9 mg/dL (ref 8.9–10.3)
Creatinine, Ser: 0.9 mg/dL (ref 0.61–1.24)
Glucose, Bld: 299 mg/dL — ABNORMAL HIGH (ref 65–99)
POTASSIUM: 4.3 mmol/L (ref 3.5–5.1)
Sodium: 137 mmol/L (ref 135–145)
TOTAL PROTEIN: 7.1 g/dL (ref 6.5–8.1)
Total Bilirubin: 1.5 mg/dL — ABNORMAL HIGH (ref 0.3–1.2)

## 2017-11-28 LAB — CBC WITH DIFFERENTIAL/PLATELET
BASOS ABS: 0 10*3/uL (ref 0.0–0.1)
Basophils Relative: 0 %
EOS PCT: 3 %
Eosinophils Absolute: 0.3 10*3/uL (ref 0.0–0.7)
HCT: 32.5 % — ABNORMAL LOW (ref 39.0–52.0)
Hemoglobin: 10.3 g/dL — ABNORMAL LOW (ref 13.0–17.0)
LYMPHS ABS: 1.4 10*3/uL (ref 0.7–4.0)
LYMPHS PCT: 16 %
MCH: 30.7 pg (ref 26.0–34.0)
MCHC: 31.7 g/dL (ref 30.0–36.0)
MCV: 96.7 fL (ref 78.0–100.0)
MONO ABS: 0.6 10*3/uL (ref 0.1–1.0)
MONOS PCT: 7 %
Neutro Abs: 6.6 10*3/uL (ref 1.7–7.7)
Neutrophils Relative %: 74 %
PLATELETS: 212 10*3/uL (ref 150–400)
RBC: 3.36 MIL/uL — ABNORMAL LOW (ref 4.22–5.81)
RDW: 12.9 % (ref 11.5–15.5)
WBC: 8.9 10*3/uL (ref 4.0–10.5)

## 2017-11-28 LAB — URINALYSIS, MICROSCOPIC (REFLEX): RBC / HPF: NONE SEEN RBC/hpf (ref 0–5)

## 2017-11-28 LAB — URINALYSIS, ROUTINE W REFLEX MICROSCOPIC
Bilirubin Urine: NEGATIVE
HGB URINE DIPSTICK: NEGATIVE
Ketones, ur: NEGATIVE mg/dL
Leukocytes, UA: NEGATIVE
Nitrite: NEGATIVE
PROTEIN: NEGATIVE mg/dL
Specific Gravity, Urine: 1.02 (ref 1.005–1.030)
pH: 5.5 (ref 5.0–8.0)

## 2017-11-28 LAB — BRAIN NATRIURETIC PEPTIDE: B Natriuretic Peptide: 523 pg/mL — ABNORMAL HIGH (ref 0.0–100.0)

## 2017-11-28 LAB — TROPONIN I: TROPONIN I: 0.04 ng/mL — AB (ref <0.03)

## 2017-11-28 LAB — CBG MONITORING, ED: Glucose-Capillary: 226 mg/dL — ABNORMAL HIGH (ref 65–99)

## 2017-11-28 MED ORDER — AMLODIPINE BESYLATE 5 MG PO TABS
10.0000 mg | ORAL_TABLET | Freq: Every day | ORAL | Status: DC
  Administered 2017-11-29: 10 mg via ORAL
  Filled 2017-11-28: qty 2

## 2017-11-28 MED ORDER — METOPROLOL TARTRATE 50 MG PO TABS
100.0000 mg | ORAL_TABLET | Freq: Two times a day (BID) | ORAL | Status: DC
  Administered 2017-11-29 (×2): 100 mg via ORAL
  Filled 2017-11-28 (×2): qty 2

## 2017-11-28 MED ORDER — ENOXAPARIN SODIUM 40 MG/0.4ML ~~LOC~~ SOLN
40.0000 mg | SUBCUTANEOUS | Status: DC
  Administered 2017-11-29: 40 mg via SUBCUTANEOUS
  Filled 2017-11-28: qty 0.4

## 2017-11-28 MED ORDER — POTASSIUM CHLORIDE CRYS ER 20 MEQ PO TBCR
20.0000 meq | EXTENDED_RELEASE_TABLET | Freq: Every day | ORAL | Status: DC
  Administered 2017-11-29: 20 meq via ORAL
  Filled 2017-11-28: qty 1

## 2017-11-28 MED ORDER — LISINOPRIL 10 MG PO TABS
20.0000 mg | ORAL_TABLET | Freq: Every day | ORAL | Status: DC
  Administered 2017-11-29: 20 mg via ORAL
  Filled 2017-11-28: qty 2

## 2017-11-28 MED ORDER — VITAMIN B-12 1000 MCG PO TABS
2000.0000 ug | ORAL_TABLET | Freq: Every day | ORAL | Status: DC
  Administered 2017-11-29: 2000 ug via ORAL
  Filled 2017-11-28: qty 2

## 2017-11-28 MED ORDER — ACETAMINOPHEN 325 MG PO TABS: 650.0000 mg | ORAL_TABLET | ORAL | Status: DC | PRN

## 2017-11-28 MED ORDER — INSULIN GLARGINE 100 UNIT/ML ~~LOC~~ SOLN
50.0000 [IU] | Freq: Every morning | SUBCUTANEOUS | Status: DC
  Administered 2017-11-29: 30 [IU] via SUBCUTANEOUS
  Filled 2017-11-28 (×3): qty 0.5

## 2017-11-28 MED ORDER — TRAMADOL HCL 50 MG PO TABS
50.0000 mg | ORAL_TABLET | Freq: Four times a day (QID) | ORAL | Status: DC | PRN
  Administered 2017-11-29 (×2): 50 mg via ORAL
  Filled 2017-11-28 (×2): qty 1

## 2017-11-28 MED ORDER — NITROGLYCERIN 0.4 MG SL SUBL: 0.4000 mg | SUBLINGUAL_TABLET | SUBLINGUAL | Status: DC | PRN

## 2017-11-28 MED ORDER — SODIUM CHLORIDE 0.9% FLUSH
3.0000 mL | Freq: Two times a day (BID) | INTRAVENOUS | Status: DC
  Administered 2017-11-29: 3 mL via INTRAVENOUS

## 2017-11-28 MED ORDER — AMOXICILLIN-POT CLAVULANATE 875-125 MG PO TABS
1.0000 | ORAL_TABLET | Freq: Two times a day (BID) | ORAL | Status: DC
  Administered 2017-11-29 (×2): 1 via ORAL
  Filled 2017-11-28 (×2): qty 1

## 2017-11-28 MED ORDER — FUROSEMIDE 10 MG/ML IJ SOLN
40.0000 mg | Freq: Once | INTRAMUSCULAR | Status: AC
  Administered 2017-11-28: 40 mg via INTRAVENOUS
  Filled 2017-11-28: qty 4

## 2017-11-28 MED ORDER — TRAMADOL HCL 50 MG PO TABS: 50.0000 mg | ORAL_TABLET | Freq: Four times a day (QID) | ORAL | 0 refills | Status: DC | PRN

## 2017-11-28 MED ORDER — SULFAMETHOXAZOLE-TRIMETHOPRIM 800-160 MG PO TABS: 1.0000 | ORAL_TABLET | Freq: Two times a day (BID) | ORAL | 0 refills | Status: DC

## 2017-11-28 MED ORDER — SODIUM CHLORIDE 0.9 % IV SOLN: 250.0000 mL | INTRAVENOUS | Status: DC | PRN

## 2017-11-28 MED ORDER — ONDANSETRON HCL 4 MG/2ML IJ SOLN: 4.0000 mg | Freq: Four times a day (QID) | INTRAMUSCULAR | Status: DC | PRN

## 2017-11-28 MED ORDER — FUROSEMIDE 10 MG/ML IJ SOLN
40.0000 mg | Freq: Two times a day (BID) | INTRAMUSCULAR | Status: DC
  Administered 2017-11-29: 40 mg via INTRAVENOUS
  Filled 2017-11-28: qty 4

## 2017-11-28 MED ORDER — GABAPENTIN 100 MG PO CAPS: 100.0000 mg | ORAL_CAPSULE | Freq: Two times a day (BID) | ORAL | Status: DC

## 2017-11-28 MED ORDER — INSULIN ASPART 100 UNIT/ML ~~LOC~~ SOLN
0.0000 [IU] | Freq: Three times a day (TID) | SUBCUTANEOUS | Status: DC
Start: 2017-11-29 — End: 2017-11-29
  Administered 2017-11-29: 3 [IU] via SUBCUTANEOUS

## 2017-11-28 MED ORDER — ATORVASTATIN CALCIUM 40 MG PO TABS
80.0000 mg | ORAL_TABLET | Freq: Every day | ORAL | Status: DC
  Filled 2017-11-28: qty 2

## 2017-11-28 MED ORDER — IPRATROPIUM-ALBUTEROL 0.5-2.5 (3) MG/3ML IN SOLN: 3.0000 mL | RESPIRATORY_TRACT | Status: DC | PRN

## 2017-11-28 MED ORDER — ASPIRIN 81 MG PO CHEW
81.0000 mg | CHEWABLE_TABLET | Freq: Every day | ORAL | Status: DC
  Administered 2017-11-29: 81 mg via ORAL
  Filled 2017-11-28: qty 1

## 2017-11-28 MED ORDER — GUAIFENESIN-DM 100-10 MG/5ML PO SYRP: 5.0000 mL | ORAL_SOLUTION | ORAL | Status: DC | PRN

## 2017-11-28 MED ORDER — SODIUM CHLORIDE 0.9% FLUSH: 3.0000 mL | INTRAVENOUS | Status: DC | PRN

## 2017-11-28 MED ORDER — TIMOLOL MALEATE 0.25 % OP SOLN
1.0000 [drp] | Freq: Every morning | OPHTHALMIC | Status: DC
  Administered 2017-11-29: 1 [drp] via OPHTHALMIC
  Filled 2017-11-28: qty 5

## 2017-11-28 MED ORDER — SULFAMETHOXAZOLE-TRIMETHOPRIM 800-160 MG PO TABS
1.0000 | ORAL_TABLET | Freq: Two times a day (BID) | ORAL | Status: DC
  Administered 2017-11-29 (×2): 1 via ORAL
  Filled 2017-11-28 (×2): qty 1

## 2017-11-28 MED ORDER — INSULIN ASPART 100 UNIT/ML ~~LOC~~ SOLN
0.0000 [IU] | Freq: Every day | SUBCUTANEOUS | Status: DC
  Administered 2017-11-29: 2 [IU] via SUBCUTANEOUS

## 2017-11-28 NOTE — H&P (Signed)
History and Physical    Marcus Hendrix NTI:[id number] DOB: August 02, 1941 DOA: 11/28/2017  PCP: [name], MD   Patient coming from: Home  Chief Complaint: Chest pain, dyspnea  HPI: Marcus Hendrix is a 76 y.o. male with medical history significant for CAD with prior CABG x2, diabetes, hypertension, dyslipidemia, peripheral neuropathy, and right groin inguinal hernia who presented to the emergency department with complaints of right-sided groin pain related to his hernia as well as right foot pain.  He was actually diagnosed with a reducible hernia along with a diabetic foot infection and was about to be sent home on oral antibiotics and pain medications to follow-up with his primary care provider when he suddenly reported some chest pain and shortness of breath.  His heart rate went up to 145 bpm and he had some transient respiratory distress when ambulating.  He noted substernal chest pressure which quickly subsided after he laid back in bed.  His pulse oximetry remains in the low 90th percentile and he is in no acute respiratory distress while at rest.  Patient states that he has been taking his home medications as prescribed including his daily Lasix 40 mg.  He states however, that he has been getting some worsening lower extremity edema over the last week and when asked further about the symptoms, he actually states that he has had shortness of breath as well as some chest pain that has been constant for about 1 week.  He has also had a mild cough with clear sputum production.  He denies any fever, chills, rhinorrhea, hemoptysis, or lightheadedness.   ED Course: After the patient experienced his cardiopulmonary symptoms, chest x-ray was ordered demonstrating cardiomegaly with a small right pleural effusion.  Additionally EKG was personally reviewed demonstrating sinus arrhythmia with no changes suggestive of ACS.  Troponin was ordered with mild elevation at 0.04.  BNP was noted to be  523.  Review of Systems: As per HPI otherwise 10 point review of systems negative.   Past Medical History:  Diagnosis Date  . Cerebrovascular disease   . Diabetes mellitus   . Diabetes mellitus ([place])   . ED (erectile dysfunction)   . Glaucoma, left eye   . History of atherosclerotic cardiovascular disease   . History of myocardial infarction   . Hyperlipidemia   . Hypertension   . Inguinal hernia    Right  . Normocytic anemia   . Peripheral neuropathy   . PVD (peripheral vascular disease) ([place])   . Tubulovillous adenoma of colon     Past Surgical History:  Procedure Laterality Date  . CHOLECYSTECTOMY    . COLONOSCOPY [name] 07/22/2014   Procedure: COLONOSCOPY;  Surgeon: [name], MD;  Location: [facility];  Service: Endoscopy;  Laterality: [name];  1235-moved to [place] notified pt  . CORONARY ARTERY BYPASS GRAFT  2002   x6  . INGUINAL HERNIA REPAIR  2004   Left     reports that he quit smoking about 19 years ago. His smoking use included cigarettes. He started smoking about 60 years ago. He has a 41.00 pack-year smoking history. His smokeless tobacco use includes chew. He reports that he does not drink alcohol or use drugs.  Allergies  Allergen Reactions  . Bidil [Isosorb Dinitrate-Hydralazine]     PASSED OUT BUT NOT SURE IF THIS WAS THE CAUSE    Family History  Problem Relation Age of Onset  . Heart attack Mother   . Stroke Father   .  Diabetes Sister   . HIV Brother   . Alcohol abuse Brother   . Diabetes Sister   . Heart attack Son     Prior to Admission medications   Medication Sig Start Date End Date Taking? Authorizing Provider  amLODipine (NORVASC) 10 MG tablet Take 10 mg by mouth daily.     Yes [provider]  aspirin 81 MG tablet Take 81 mg by mouth daily.   Yes [provider]  atorvastatin (LIPITOR) 80 MG tablet TAKE ONE TABLET BY MOUTH ONCE DAILY. 06/17/17  Yes BranchAlphonse Guild, MD  furosemide (LASIX) 40 MG tablet Take 40 mg  by mouth daily.     Yes [provider]  gabapentin (NEURONTIN) 100 MG capsule Take 100-200 mg by mouth 2 (two) times daily.  04/15/16  Yes [provider]  insulin glargine (LANTUS) 100 UNIT/ML injection Inject 50 Units into the skin every morning.    Yes [provider]  lisinopril (PRINIVIL,ZESTRIL) 20 MG tablet TAKE ONE TABLET BY MOUTH TWICE DAILY. Patient taking differently: TAKE ONE TABLET BY MOUTH DAILY 01/18/14  Yes Lendon Colonel, NP  metoprolol (LOPRESSOR) 100 MG tablet Take 100 mg by mouth 2 (two) times daily.     Yes [provider]  nitroGLYCERIN (NITROSTAT) 0.4 MG SL tablet Place 0.4 mg under the tongue every 5 (five) minutes as needed for chest pain.  04/15/16  Yes [provider]  potassium chloride SA (K-DUR,KLOR-CON) 20 MEQ tablet Take 20 mEq by mouth daily.   Yes [provider]  timolol (TIMOPTIC) 0.25 % ophthalmic solution Place 1 drop into the left eye every morning.    Yes [provider]  vitamin B-12 (CYANOCOBALAMIN) 1000 MCG tablet Take 2,000 mcg by mouth daily.   Yes [provider]  sulfamethoxazole-trimethoprim (BACTRIM DS,SEPTRA DS) 800-160 MG tablet Take 1 tablet by mouth 2 (two) times daily for 7 days. 11/28/17 12/05/17  Milton Ferguson, MD  traMADol (ULTRAM) 50 MG tablet Take 1 tablet (50 mg total) by mouth every 6 (six) hours as needed. 11/28/17   Milton Ferguson, MD    Physical Exam: Vitals:   11/28/17 1930 11/28/17 2030 11/28/17 2100 11/28/17 2111  BP: (!) 162/68 (!) 179/71 (!) 176/76   Pulse: (!) 58   73  Resp: 16 16 (!) 23 17  Temp:      TempSrc:      SpO2: (!) 87%  (!) 89% 93%  Weight:      Height:        Constitutional: NAD, calm, comfortable on RA Vitals:   11/28/17 1930 11/28/17 2030 11/28/17 2100 11/28/17 2111  BP: (!) 162/68 (!) 179/71 (!) 176/76   Pulse: (!) 58   73  Resp: 16 16 (!) 23 17  Temp:      TempSrc:      SpO2: (!) 87%  (!) 89% 93%  Weight:      Height:        Eyes: lids and conjunctivae normal ENMT: Mucous membranes are moist.  Neck: normal, supple Respiratory: clear to auscultation bilaterally. Normal respiratory effort. No accessory muscle use.  Cardiovascular: Regular rate and rhythm, no murmurs. Scant edema to LE; midshin Abdomen: no tenderness, no distention. Bowel sounds positive.  Musculoskeletal:  No joint deformity upper and lower extremities.   Skin: no rashes, lesions, ulcers.  Psychiatric: Normal judgment and insight. Alert and oriented x 3. Normal mood.   Labs on Admission: I have personally reviewed following labs and imaging studies  CBC: Recent Labs  Lab 11/28/17 1630  WBC 8.9  NEUTROABS 6.6  HGB 10.3*  HCT 32.5*  MCV 96.7  PLT 503   Basic Metabolic Panel: Recent Labs  Lab 11/28/17 1630  NA 137  K 4.3  CL 103  CO2 26  GLUCOSE 299*  BUN 18  CREATININE 0.90  CALCIUM 8.9   GFR: Estimated Creatinine Clearance: 70.9 mL/min (by C-G formula based on SCr of 0.9 mg/dL). Liver Function Tests: Recent Labs  Lab 11/28/17 1630  AST 41  ALT 48  ALKPHOS 120  BILITOT 1.5*  PROT 7.1  ALBUMIN 3.7   No results for input(s): LIPASE, AMYLASE in the last 168 hours. No results for input(s): AMMONIA in the last 168 hours. Coagulation Profile: No results for input(s): INR, PROTIME in the last 168 hours. Cardiac Enzymes: Recent Labs  Lab 11/28/17 1913  TROPONINI 0.04*   BNP (last 3 results) No results for input(s): PROBNP in the last 8760 hours. HbA1C: No results for input(s): HGBA1C in the last 72 hours. CBG: No results for input(s): GLUCAP in the last 168 hours. Lipid Profile: No results for input(s): CHOL, HDL, LDLCALC, TRIG, CHOLHDL, LDLDIRECT in the last 72 hours. Thyroid Function Tests: No results for input(s): TSH, T4TOTAL, FREET4, T3FREE, THYROIDAB in the last 72 hours. Anemia Panel: No results for input(s): VITAMINB12, FOLATE, FERRITIN, TIBC, IRON, RETICCTPCT in the last 72 hours. Urine analysis:     Component Value Date/Time   COLORURINE YELLOW 11/28/2017 1412   APPEARANCEUR CLEAR 11/28/2017 1412   LABSPEC 1.020 11/28/2017 1412   PHURINE 5.5 11/28/2017 1412   GLUCOSEU >=500 (A) 11/28/2017 1412   HGBUR NEGATIVE 11/28/2017 1412   HGBUR negative 04/15/2008 1334   BILIRUBINUR NEGATIVE 11/28/2017 1412   KETONESUR NEGATIVE 11/28/2017 1412   PROTEINUR NEGATIVE 11/28/2017 1412   UROBILINOGEN 0.2 07/31/2013 0641   NITRITE NEGATIVE 11/28/2017 1412   LEUKOCYTESUR NEGATIVE 11/28/2017 1412    Radiological Exams on Admission: Dg Chest 2 View  Result Date: 11/28/2017 CLINICAL DATA:  Dyspnea and chest pain with nonproductive cough and wheezing x1 week. EXAM: CHEST  2 VIEW COMPARISON:  05/22/2016 FINDINGS: Stable cardiomegaly with moderate aortic atherosclerosis. Status post median sternotomy. Diffuse interstitial prominence and peribronchial thickening consistent bronchitic change. No alveolar consolidation. Chronic posterior costophrenic angle blunting that may represent a small effusion or pleural thickening. No acute osseous abnormality. IMPRESSION: Stable cardiomegaly with aortic atherosclerosis. Peribronchial thickening with increased interstitial lung markings suspicious for bronchitic change. Electronically Signed   By: Ashley Royalty M.D.   On: 11/28/2017 20:14   Dg Foot Complete Right  Result Date: 11/28/2017 CLINICAL DATA:  Right foot pain and swelling. EXAM: RIGHT FOOT COMPLETE - 3+ VIEW COMPARISON:  None. FINDINGS: There is no evidence of fracture or dislocation. No focal bone erosions. Mild hallux valgus deformity with degenerative changes at the first MTP joint. Soft tissues are unremarkable. IMPRESSION: No acute bone abnormalities. Electronically Signed   By: Kerby Moors M.D.   On: 11/28/2017 17:38    EKG: Independently reviewed. Sinus arrythmia with LAE; 73bpm; no signs of ischemic change. No RV strain.  Assessment/Plan Principal Problem:   Acute CHF (congestive heart failure)  (HCC) Active Problems:   Dyslipidemia   MYOCARDIAL INFARCTION, HX OF   DM type 2 (diabetes mellitus, type 2) (HCC)   HTN (hypertension), benign   Diabetic foot infection ([place])    Acute congestive heart failure with mild troponin elevation -With signs of volume overload Lasix 40 mg IV twice daily Strict  I's and O's Daily weights 2D echocardiogram Trend troponins EKG in a.m. Continue home ACE inhibitor, beta-blocker, and low-dose aspirin Oxygen via nasal cannula Duo nebs as needed  Type 2 diabetes-with noted hyperglycemia Hemoglobin A1c Continue home Lantus Sliding scale insulin  Mild diabetic foot infection-uncomplicated Bactrim and Augmentin for full spectrum coverage Tramadol as needed for pain  Hypertension Continue home medications and monitor  Dyslipidemia Continue home statin   DVT prophylaxis: Lovenox Code Status: Full Family Communication: Wife at bedside Disposition Plan:Home in AM Consults called:None Admission status: Obs/med-surg with tele   Pratik Darleen Crocker DO [facility] Pager [phone]  If 7PM-7AM, please contact night-coverage [email or web address] Password [facility]  11/28/2017, 9:26 PM

## 2017-11-28 NOTE — ED Notes (Signed)
Pt was sitting in chair after getting dressed and reports chest pain, sob, and feeling like heart was racing  PT's HR 139-145 with pulse ox, 02 sat 91-93% on room air.  Pt back in bed, ekg performed and notified edp.  EDP will reassess pt.

## 2017-11-28 NOTE — ED Provider Notes (Addendum)
[facility] EMERGENCY DEPARTMENT Provider Note   CSN: [id number] Arrival date & time: 11/28/17  1358     History   Chief Complaint Chief Complaint  Patient presents with  . Groin Pain    HPI Marcus Hendrix is a 76 y.o. male.  Patient comes in complaining of an inguinal hernia on the right side also some pain in his leg and foot.  Patient has diabetes and a history of this hernia for quite some time   The history is provided by the patient.  Groin Pain  This is a recurrent problem. The current episode started more than 1 week ago. The problem occurs constantly. The problem has not changed since onset.Pertinent negatives include no chest pain, no abdominal pain and no headaches. Nothing aggravates the symptoms. Nothing relieves the symptoms. He has tried nothing for the symptoms. The treatment provided no relief.    Past Medical History:  Diagnosis Date  . Cerebrovascular disease   . Diabetes mellitus   . Diabetes mellitus ([place])   . ED (erectile dysfunction)   . Glaucoma, left eye   . History of atherosclerotic cardiovascular disease   . History of myocardial infarction   . Hyperlipidemia   . Hypertension   . Inguinal hernia    Right  . Normocytic anemia   . Peripheral neuropathy   . PVD (peripheral vascular disease) ([place])   . Tubulovillous adenoma of colon     Patient Active Problem List   Diagnosis Date Noted  . Rectal bleeding 10/04/2013  . Heme positive stool 10/04/2013  . Community acquired pneumonia 07/30/2013  . DM type 2 (diabetes mellitus, type 2) ([place]) 07/30/2013  . HTN (hypertension), benign 07/30/2013  . CAROTID BRUITS, BILATERAL 07/16/2010  . MITRAL VALVE PROLAPSE 08/22/2009  . DM 07/22/2009  . Atherosclerotic cardiovascular disease 07/22/2009  . 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 ERECTILE DYSFUNCTION 05/27/2008  . INGUINAL HERNIA, RIGHT 10/19/2007  . ANEMIA, NORMOCYTIC 10/09/2007  . HYPERLIPIDEMIA 12/19/2006  . PERIPHERAL  NEUROPATHY 12/19/2006  . GLAUCOMA, LEFT EYE 12/19/2006  . HYPERTENSION 12/19/2006  . MYOCARDIAL INFARCTION, HX OF 12/19/2006  . PERIPHERAL VASCULAR DISEASE 12/19/2006    Past Surgical History:  Procedure Laterality Date  . CHOLECYSTECTOMY    . COLONOSCOPY [name] 07/22/2014   Procedure: COLONOSCOPY;  Surgeon: [name], MD;  Location: [facility];  Service: Endoscopy;  Laterality: [name];  1235-moved to [place] notified pt  . CORONARY ARTERY BYPASS GRAFT  2002   x6  . INGUINAL HERNIA REPAIR  2004   Left       Home Medications    Prior to Admission medications   Medication Sig Start Date End Date Taking? Authorizing Provider  amLODipine (NORVASC) 10 MG tablet Take 10 mg by mouth daily.     Yes [provider]  aspirin 81 MG tablet Take 81 mg by mouth daily.   Yes [provider]  atorvastatin (LIPITOR) 80 MG tablet TAKE ONE TABLET BY MOUTH ONCE DAILY. 06/17/17  Yes BranchAlphonse Guild, MD  furosemide (LASIX) 40 MG tablet Take 40 mg by mouth daily.     Yes [provider]  gabapentin (NEURONTIN) 100 MG capsule Take 100-200 mg by mouth 2 (two) times daily.  04/15/16  Yes [provider]  insulin glargine (LANTUS) 100 UNIT/ML injection Inject 50 Units into the skin every morning.    Yes [provider]  lisinopril (PRINIVIL,ZESTRIL) 20 MG tablet TAKE ONE TABLET BY MOUTH TWICE DAILY. Patient taking differently:  TAKE ONE TABLET BY MOUTH DAILY 01/18/14  Yes Lendon Colonel, NP  metoprolol (LOPRESSOR) 100 MG tablet Take 100 mg by mouth 2 (two) times daily.     Yes [provider]  nitroGLYCERIN (NITROSTAT) 0.4 MG SL tablet Place 0.4 mg under the tongue every 5 (five) minutes as needed for chest pain.  04/15/16  Yes [provider]  potassium chloride SA (K-DUR,KLOR-CON) 20 MEQ tablet Take 20 mEq by mouth daily.   Yes [provider]  timolol (TIMOPTIC) 0.25 % ophthalmic solution Place 1 drop into the left eye every  morning.    Yes [provider]  vitamin B-12 (CYANOCOBALAMIN) 1000 MCG tablet Take 2,000 mcg by mouth daily.   Yes [provider]  sulfamethoxazole-trimethoprim (BACTRIM DS,SEPTRA DS) 800-160 MG tablet Take 1 tablet by mouth 2 (two) times daily for 7 days. 11/28/17 12/05/17  Milton Ferguson, MD  traMADol (ULTRAM) 50 MG tablet Take 1 tablet (50 mg total) by mouth every 6 (six) hours as needed. 11/28/17   Milton Ferguson, MD    Family History Family History  Problem Relation Age of Onset  . Heart attack Mother   . Stroke Father   . Diabetes Sister   . HIV Brother   . Alcohol abuse Brother   . Diabetes Sister   . Heart attack Son     Social History Social History   Tobacco Use  . Smoking status: Former Smoker    Packs/day: 1.00    Years: 41.00    Pack years: 41.00    Types: Cigarettes    Start date: 02/06/1957    Last attempt to quit: 12/16/1997    Years since quitting: 19.9  . Smokeless tobacco: Current User    Types: Chew  . Tobacco comment: quit 12 yrs ago--10/13/15 currently a tobacco chewer for the past 25 years  Substance Use Topics  . Alcohol use: No    Alcohol/week: 0.0 oz  . Drug use: No     Allergies   Bidil [isosorb dinitrate-hydralazine]   Review of Systems Review of Systems  Constitutional: Negative for appetite change and fatigue.  HENT: Negative for congestion, ear discharge and sinus pressure.   Eyes: Negative for discharge.  Respiratory: Negative for cough.   Cardiovascular: Negative for chest pain.  Gastrointestinal: Negative for abdominal pain and diarrhea.       Inguinal hernia  Genitourinary: Negative for frequency and hematuria.  Musculoskeletal: Negative for back pain.  Skin: Negative for rash.  Neurological: Negative for seizures and headaches.  Psychiatric/Behavioral: Negative for hallucinations.     Physical Exam Updated Vital Signs BP (!) 158/75 (BP Location: Left Arm)   Pulse 72   Temp 98.6 F (37 C) (Oral)    Resp 15   Ht 5\' 6"  (1.676 m)   Wt 83.9 kg (185 lb)   SpO2 90%   BMI 29.86 kg/m   Physical Exam  Constitutional: He is oriented to person, place, and time. He appears well-developed.  HENT:  Head: Normocephalic.  Eyes: Conjunctivae and EOM are normal. No scleral icterus.  Neck: Neck supple. No thyromegaly present.  Cardiovascular: Normal rate and regular rhythm. Exam reveals no gallop and no friction rub.  No murmur heard. Pulmonary/Chest: No stridor. He has no wheezes. He has no rales. He exhibits no tenderness.  Abdominal: He exhibits no distension. There is no tenderness. There is no rebound.  Patient has a large right inguinal hernia that is reducible but comes right back out.  It is  not incarcerated.  Not tender  Musculoskeletal: Normal range of motion. He exhibits no edema.  Patient's right foot is tender distally and red.  He has inflammation in 3 of his toes.  Dorsalis pedis pulses 1+ with good cap refill  Lymphadenopathy:    He has no cervical adenopathy.  Neurological: He is oriented to person, place, and time. He exhibits normal muscle tone. Coordination normal.  Skin: No rash noted. No erythema.  Psychiatric: He has a normal mood and affect. His behavior is normal.     ED Treatments / Results  Labs (all labs ordered are listed, but only abnormal results are displayed) Labs Reviewed  URINALYSIS, ROUTINE W REFLEX MICROSCOPIC - Abnormal; Notable for the following components:      Result Value   Glucose, UA >=500 (*)    All other components within normal limits  CBC WITH DIFFERENTIAL/PLATELET - Abnormal; Notable for the following components:   RBC 3.36 (*)    Hemoglobin 10.3 (*)    HCT 32.5 (*)    All other components within normal limits  COMPREHENSIVE METABOLIC PANEL - Abnormal; Notable for the following components:   Glucose, Bld 299 (*)    Total Bilirubin 1.5 (*)    All other components within normal limits  URINALYSIS, MICROSCOPIC (REFLEX) - Abnormal; Notable  for the following components:   Bacteria, UA RARE (*)    Squamous Epithelial / LPF 0-5 (*)    All other components within normal limits    EKG  EKG Interpretation None       Radiology Dg Foot Complete Right  Result Date: 11/28/2017 CLINICAL DATA:  Right foot pain and swelling. EXAM: RIGHT FOOT COMPLETE - 3+ VIEW COMPARISON:  None. FINDINGS: There is no evidence of fracture or dislocation. No focal bone erosions. Mild hallux valgus deformity with degenerative changes at the first MTP joint. Soft tissues are unremarkable. IMPRESSION: No acute bone abnormalities. Electronically Signed   By: Kerby Moors M.D.   On: 11/28/2017 17:38    Procedures Procedures (including critical care time)  Medications Ordered in ED Medications - No data to display   Initial Impression / Assessment and Plan / ED Course  I have reviewed the triage vital signs and the nursing notes.  Pertinent labs & imaging results that were available during my care of the patient were reviewed by me and considered in my medical decision making (see chart for details).     Patient with a chronic reducible inguinal hernia and I have referred him to surgery.  Patient with inflamed right toes.  This could be infection from diabetic foot.  Patient will be placed on antibiotics pain medicine and referred back to his PCP When the patient got up to be discharged she started having palpitations his heart rate went up to 130.  Additional history patient states that he has been having shortness of breath palpitations and chest pains off and on since 1 week ago.  Further evaluation were done with troponin chest x-ray BNP and patient seem to be in mild heart failure with a mildly elevated troponin.  He will be admitted to medicine Final Clinical Impressions(s) / ED Diagnoses   Final diagnoses:  Inguinal hernia of right side without obstruction or gangrene    ED Discharge Orders        Ordered    traMADol (ULTRAM) 50 MG  tablet  Every 6 hours PRN     11/28/17 1837    sulfamethoxazole-trimethoprim (BACTRIM DS,SEPTRA DS) 800-160 MG tablet  2 times daily     11/28/17 1837       Milton Ferguson, MD 11/28/17 Daneil Dolin    Milton Ferguson, MD 11/28/17 [id number]

## 2017-11-28 NOTE — ED Notes (Signed)
MD at the beside.

## 2017-11-28 NOTE — ED Triage Notes (Signed)
PT states he has a known hernia and was told it needed to be surgically repaired. PT states the pain no goes from his right groin area and radiates down the right leg x1 week.

## 2017-11-28 NOTE — ED Notes (Signed)
Pt coughing, congestion, non productive, 02 dropped 89%, placed on 2 L 02 via [place]

## 2017-11-28 NOTE — ED Notes (Signed)
Hospitalist at the bedside 

## 2017-11-28 NOTE — ED Notes (Signed)
Xray at the bedside.

## 2017-11-28 NOTE — ED Notes (Signed)
MD at the bedside will order more test, pt coughing, states he has been for a week. Pain now 5/10

## 2017-11-29 ENCOUNTER — Observation Stay (HOSPITAL_BASED_OUTPATIENT_CLINIC_OR_DEPARTMENT_OTHER): Payer: Medicare Other

## 2017-11-29 DIAGNOSIS — I252 Old myocardial infarction: Secondary | ICD-10-CM

## 2017-11-29 DIAGNOSIS — E11628 Type 2 diabetes mellitus with other skin complications: Secondary | ICD-10-CM | POA: Diagnosis not present

## 2017-11-29 DIAGNOSIS — E119 Type 2 diabetes mellitus without complications: Secondary | ICD-10-CM | POA: Diagnosis not present

## 2017-11-29 DIAGNOSIS — I509 Heart failure, unspecified: Secondary | ICD-10-CM

## 2017-11-29 DIAGNOSIS — L089 Local infection of the skin and subcutaneous tissue, unspecified: Secondary | ICD-10-CM

## 2017-11-29 DIAGNOSIS — I371 Nonrheumatic pulmonary valve insufficiency: Secondary | ICD-10-CM | POA: Diagnosis not present

## 2017-11-29 DIAGNOSIS — E785 Hyperlipidemia, unspecified: Secondary | ICD-10-CM | POA: Diagnosis not present

## 2017-11-29 DIAGNOSIS — I5041 Acute combined systolic (congestive) and diastolic (congestive) heart failure: Secondary | ICD-10-CM | POA: Diagnosis not present

## 2017-11-29 DIAGNOSIS — I10 Essential (primary) hypertension: Secondary | ICD-10-CM | POA: Diagnosis not present

## 2017-11-29 DIAGNOSIS — K4090 Unilateral inguinal hernia, without obstruction or gangrene, not specified as recurrent: Secondary | ICD-10-CM | POA: Diagnosis not present

## 2017-11-29 LAB — BASIC METABOLIC PANEL
ANION GAP: 9 (ref 5–15)
BUN: 17 mg/dL (ref 6–20)
CALCIUM: 8.5 mg/dL — AB (ref 8.9–10.3)
CO2: 26 mmol/L (ref 22–32)
Chloride: 101 mmol/L (ref 101–111)
Creatinine, Ser: 0.9 mg/dL (ref 0.61–1.24)
GFR calc Af Amer: >60 mL/min (ref >60)
GLUCOSE: 196 mg/dL — AB (ref 65–99)
Potassium: 3.7 mmol/L (ref 3.5–5.1)
Sodium: 136 mmol/L (ref 135–145)

## 2017-11-29 LAB — ECHOCARDIOGRAM COMPLETE
Height: 66 in
Weight: 2960 oz

## 2017-11-29 LAB — GLUCOSE, CAPILLARY
GLUCOSE-CAPILLARY: 193 mg/dL — AB (ref 65–99)
GLUCOSE-CAPILLARY: 179 mg/dL — AB (ref 65–99)
Glucose-Capillary: 238 mg/dL — ABNORMAL HIGH (ref 65–99)

## 2017-11-29 LAB — TROPONIN I
Troponin I: 0.04 ng/mL (ref <0.03)
Troponin I: 0.04 ng/mL (ref <0.03)

## 2017-11-29 MED ORDER — GABAPENTIN 100 MG PO CAPS
100.0000 mg | ORAL_CAPSULE | Freq: Two times a day (BID) | ORAL | Status: DC
  Administered 2017-11-29: 100 mg via ORAL
  Filled 2017-11-29: qty 1

## 2017-11-29 MED ORDER — GUAIFENESIN-DM 100-10 MG/5ML PO SYRP
5.0000 mL | ORAL_SOLUTION | ORAL | 0 refills | Status: AC | PRN
Start: 2017-11-29 — End: ?

## 2017-11-29 MED ORDER — PNEUMOCOCCAL VAC POLYVALENT 25 MCG/0.5ML IJ INJ: 0.5000 mL | INJECTION | INTRAMUSCULAR | Status: DC

## 2017-11-29 MED ORDER — POLYETHYLENE GLYCOL 3350 17 GM/SCOOP PO POWD: 17.0000 g | Freq: Every day | ORAL | 0 refills | Status: AC

## 2017-11-29 MED ORDER — IPRATROPIUM-ALBUTEROL 0.5-2.5 (3) MG/3ML IN SOLN
3.0000 mL | RESPIRATORY_TRACT | Status: DC
  Administered 2017-11-29: 3 mL via RESPIRATORY_TRACT
  Filled 2017-11-29: qty 3

## 2017-11-29 MED ORDER — OXYCODONE-ACETAMINOPHEN 5-325 MG PO TABS: 1.0000 | ORAL_TABLET | Freq: Four times a day (QID) | ORAL | 0 refills | Status: DC | PRN

## 2017-11-29 MED ORDER — SULFAMETHOXAZOLE-TRIMETHOPRIM 800-160 MG PO TABS: 1.0000 | ORAL_TABLET | Freq: Two times a day (BID) | ORAL | 0 refills | Status: DC

## 2017-11-29 MED ORDER — OXYCODONE-ACETAMINOPHEN 5-325 MG PO TABS
1.0000 | ORAL_TABLET | Freq: Four times a day (QID) | ORAL | Status: DC | PRN
  Administered 2017-11-29: 2 via ORAL
  Filled 2017-11-29: qty 2

## 2017-11-29 MED ORDER — AMOXICILLIN-POT CLAVULANATE 875-125 MG PO TABS: 1.0000 | ORAL_TABLET | Freq: Two times a day (BID) | ORAL | 0 refills | Status: DC

## 2017-11-29 NOTE — Discharge Instructions (Signed)
Follow up with your family physician next week.  Clean feet twice a day with soap and water.  Follow up with dr. Arnoldo Morale for your hernia  Follow with Primary MD  Marcus Evens, MD  and other consultants as instructed your Hospitalist MD  Please get a complete blood count and chemistry panel checked by your Primary MD at your next visit, and again as instructed by your Primary MD.  Get Medicines reviewed and adjusted: Please take all your medications with you for your next visit with your Primary MD  Laboratory/radiological data: Please request your Primary MD to go over all hospital tests and procedure/radiological results at the follow up, please ask your Primary MD to get all Hospital records sent to his/her office.  In some cases, they will be blood work, cultures and biopsy results pending at the time of your discharge. Please request that your primary care M.D. follows up on these results.  Also Note the following: If you experience worsening of your admission symptoms, develop shortness of breath, life threatening emergency, suicidal or homicidal thoughts you must seek medical attention immediately by calling 911 or calling your MD immediately  if symptoms less severe.  You must read complete instructions/literature along with all the possible adverse reactions/side effects for all the Medicines you take and that have been prescribed to you. Take any new Medicines after you have completely understood and accpet all the possible adverse reactions/side effects.   Do not drive when taking Pain medications or sleeping medications (Benzodaizepines)  Do not take more than prescribed Pain, Sleep and Anxiety Medications. It is not advisable to combine anxiety,sleep and pain medications without talking with your primary care practitioner  Special Instructions: If you have smoked or chewed Tobacco  in the last 2 yrs please stop smoking, stop any regular Alcohol  and or any Recreational drug  use.  Wear Seat belts while driving.  Please note: You were cared for by a hospitalist during your hospital stay. Once you are discharged, your primary care physician will handle any further medical issues. Please note that NO REFILLS for any discharge medications will be authorized once you are discharged, as it is imperative that you return to your primary care physician (or establish a relationship with a primary care physician if you do not have one) for your post hospital discharge needs so that they can reassess your need for medications and monitor your lab values.

## 2017-11-29 NOTE — Plan of Care (Signed)
Pt alert and oriented x4. Able to make needs known. Right foot has blister on top and all toes are swollen and red. Last two toes have skin peeling off and under the toes the skin is peeling off. Call light within reach bed in lowest position. Will continue to monitor.

## 2017-11-29 NOTE — Progress Notes (Signed)
Echocardiogram 2D Echocardiogram has been performed.  Marcus Hendrix 11/29/2017, 10:13 AM

## 2017-11-29 NOTE — Discharge Summary (Signed)
Physician Discharge Summary  Marcus Hendrix ZOX:[id number] DOB: [birth year] DOA: 11/28/2017  PCP: [name], MD Cardiology: Branch Admit date: 11/28/2017 Discharge date: 11/29/2017  Admitted From: Home  Disposition: Home   Recommendations for Outpatient Follow-up:  1. Follow up with PCP in 1 weeks 2. Follow up with surgeon in 1 week 3. Follow up with podiatrist in 1 week 4. Please obtain BMP/CBC in one week 5. Please follow up on the following pending results: final culture data and 2D Echocardiogram (pending at discharge)  Discharge Condition: STABLE   CODE STATUS: FULL    Brief Hospitalization Summary: Please see all hospital notes, images, labs for full details of the hospitalization.  HPI: Marcus Hendrix is a 76 y.o. male with medical history significant for CAD with prior CABG x2, diabetes, hypertension, dyslipidemia, peripheral neuropathy, and right groin inguinal hernia who presented to the emergency department with complaints of right-sided groin pain related to his hernia as well as right foot pain.  He was actually diagnosed with a reducible hernia along with a diabetic foot infection and was about to be sent home on oral antibiotics and pain medications to follow-up with his primary care provider when he suddenly reported some chest pain and shortness of breath.  His heart rate went up to 145 bpm and he had some transient respiratory distress when ambulating.  He noted substernal chest pressure which quickly subsided after he laid back in bed.  His pulse oximetry remains in the low 90th percentile and he is in no acute respiratory distress while at rest.  Patient states that he has been taking his home medications as prescribed including his daily Lasix 40 mg.  He states however, that he has been getting some worsening lower extremity edema over the last week and when asked further about the symptoms, he actually states that he has had shortness of breath as well as some  chest pain that has been constant for about 1 week.  He has also had a mild cough with clear sputum production.  He denies any fever, chills, rhinorrhea, hemoptysis, or lightheadedness.   ED Course: After the patient experienced his cardiopulmonary symptoms, chest x-ray was ordered demonstrating cardiomegaly with a small right pleural effusion.  Additionally EKG was personally reviewed demonstrating sinus arrhythmia with no changes suggestive of ACS.  Troponin was ordered with mild elevation at 0.04.  BNP was noted to be 523.  Acute congestive heart failure with mild troponin elevation -Admitted with signs of volume overload but that has resolved at discharge Lasix 40 mg IV twice daily was ordered with good results and good diuresis -He is down nearly 2 Liters of fluid 2D echocardiogram - fpllow up results with PCP and cardiologist (pending at time of discharge) Trend troponins - flat and stable at 0.04.  EKG no acute ischemic findings.  Continue home ACE inhibitor, beta-blocker, and low-dose aspirin Duo nebs as needed in hospital.  Type 2 diabetes-with noted hyperglycemia Resume home treatment plan and follow up with PCP  Mild diabetic foot infection-uncomplicated Bactrim and Augmentin for full spectrum coverage Follow up with Podiatrist in 1 week for recheck.   Also follow up with PCP.   Inguinal Hernia - Follow up with Dr. Arnoldo Morale (general surgery)  Hypertension Continue home medications and monitor  Dyslipidemia Continue home statin  DVT prophylaxis: Lovenox Code Status: Full Family Communication: Wife at bedside Disposition Plan:Home in AM Consults called:None Admission status: Obs/med-surg with tele  Discharge Diagnoses:  Principal Problem:   Acute CHF (  congestive heart failure) (HCC) Active Problems:   Dyslipidemia   MYOCARDIAL INFARCTION, HX OF   DM type 2 (diabetes mellitus, type 2) (HCC)   HTN (hypertension), benign   Diabetic foot infection  [facility])  Discharge Instructions: Discharge Instructions    Call MD for:  difficulty breathing, headache or visual disturbances   Complete by:  As directed    Call MD for:  extreme fatigue   Complete by:  As directed    Call MD for:  hives   Complete by:  As directed    Call MD for:  persistant dizziness or light-headedness   Complete by:  As directed    Call MD for:  persistant nausea and vomiting   Complete by:  As directed    Call MD for:  severe uncontrolled pain   Complete by:  As directed    Call MD for:  temperature >100.4   Complete by:  As directed    Increase activity slowly   Complete by:  As directed      Allergies as of 11/29/2017      Reactions   Bidil [isosorb Dinitrate-hydralazine]    PASSED OUT BUT NOT SURE IF THIS WAS THE CAUSE      Medication List    TAKE these medications   amLODipine 10 MG tablet Commonly known as:  NORVASC Take 10 mg by mouth daily.   amoxicillin-clavulanate 875-125 MG tablet Commonly known as:  AUGMENTIN Take 1 tablet by mouth every 12 (twelve) hours for 10 days.   aspirin 81 MG tablet Take 81 mg by mouth daily.   atorvastatin 80 MG tablet Commonly known as:  LIPITOR TAKE ONE TABLET BY MOUTH ONCE DAILY.   furosemide 40 MG tablet Commonly known as:  LASIX Take 40 mg by mouth daily.   gabapentin 100 MG capsule Commonly known as:  NEURONTIN Take 100-200 mg by mouth 2 (two) times daily.   guaiFENesin-dextromethorphan 100-10 MG/5ML syrup Commonly known as:  ROBITUSSIN DM Take 5 mLs by mouth every 4 (four) hours as needed for cough.   insulin glargine 100 UNIT/ML injection Commonly known as:  LANTUS Inject 50 Units into the skin every morning.   lisinopril 20 MG tablet Commonly known as:  PRINIVIL,ZESTRIL TAKE ONE TABLET BY MOUTH TWICE DAILY. What changed:    how much to take  how to take this  when to take this   metoprolol tartrate 100 MG tablet Commonly known as:  LOPRESSOR Take 100 mg by mouth 2 (two) times  daily.   nitroGLYCERIN 0.4 MG SL tablet Commonly known as:  NITROSTAT Place 0.4 mg under the tongue every 5 (five) minutes as needed for chest pain.   oxyCODONE-acetaminophen 5-325 MG tablet Commonly known as:  PERCOCET/ROXICET Take 1 tablet by mouth every 6 (six) hours as needed for severe pain.   polyethylene glycol powder powder Commonly known as:  GLYCOLAX/MIRALAX Take 17 g by mouth daily.   potassium chloride SA 20 MEQ tablet Commonly known as:  K-DUR,KLOR-CON Take 20 mEq by mouth daily.   sulfamethoxazole-trimethoprim 800-160 MG tablet Commonly known as:  BACTRIM DS,SEPTRA DS Take 1 tablet by mouth 2 (two) times daily for 10 days.   timolol 0.25 % ophthalmic solution Commonly known as:  TIMOPTIC Place 1 drop into the left eye every morning.   vitamin B-12 1000 MCG tablet Commonly known as:  CYANOCOBALAMIN Take 2,000 mcg by mouth daily.      Follow-up Information    Aviva Signs, MD Follow up in  1 week(s).   Specialty:  General Surgery Contact information: [address] [place] [address] [phone]        [name], MD. Schedule an appointment as soon as possible for a visit in 1 week(s).   Specialty:  Family Medicine Why:  Hospital Follow Up Contact information: [place] [address] [phone]          Allergies  Allergen Reactions  . Bidil [Isosorb Dinitrate-Hydralazine]     PASSED OUT BUT NOT SURE IF THIS WAS THE CAUSE   Allergies as of 11/29/2017      Reactions   Bidil [isosorb Dinitrate-hydralazine]    PASSED OUT BUT NOT SURE IF THIS WAS THE CAUSE      Medication List    TAKE these medications   amLODipine 10 MG tablet Commonly known as:  NORVASC Take 10 mg by mouth daily.   amoxicillin-clavulanate 875-125 MG tablet Commonly known as:  AUGMENTIN Take 1 tablet by mouth every 12 (twelve) hours for 10 days.   aspirin 81 MG tablet Take 81 mg by mouth daily.   atorvastatin 80 MG  tablet Commonly known as:  LIPITOR TAKE ONE TABLET BY MOUTH ONCE DAILY.   furosemide 40 MG tablet Commonly known as:  LASIX Take 40 mg by mouth daily.   gabapentin 100 MG capsule Commonly known as:  NEURONTIN Take 100-200 mg by mouth 2 (two) times daily.   guaiFENesin-dextromethorphan 100-10 MG/5ML syrup Commonly known as:  ROBITUSSIN DM Take 5 mLs by mouth every 4 (four) hours as needed for cough.   insulin glargine 100 UNIT/ML injection Commonly known as:  LANTUS Inject 50 Units into the skin every morning.   lisinopril 20 MG tablet Commonly known as:  PRINIVIL,ZESTRIL TAKE ONE TABLET BY MOUTH TWICE DAILY. What changed:    how much to take  how to take this  when to take this   metoprolol tartrate 100 MG tablet Commonly known as:  LOPRESSOR Take 100 mg by mouth 2 (two) times daily.   nitroGLYCERIN 0.4 MG SL tablet Commonly known as:  NITROSTAT Place 0.4 mg under the tongue every 5 (five) minutes as needed for chest pain.   oxyCODONE-acetaminophen 5-325 MG tablet Commonly known as:  PERCOCET/ROXICET Take 1 tablet by mouth every 6 (six) hours as needed for severe pain.   polyethylene glycol powder powder Commonly known as:  GLYCOLAX/MIRALAX Take 17 g by mouth daily.   potassium chloride SA 20 MEQ tablet Commonly known as:  K-DUR,KLOR-CON Take 20 mEq by mouth daily.   sulfamethoxazole-trimethoprim 800-160 MG tablet Commonly known as:  BACTRIM DS,SEPTRA DS Take 1 tablet by mouth 2 (two) times daily for 10 days.   timolol 0.25 % ophthalmic solution Commonly known as:  TIMOPTIC Place 1 drop into the left eye every morning.   vitamin B-12 1000 MCG tablet Commonly known as:  CYANOCOBALAMIN Take 2,000 mcg by mouth daily.       Procedures/Studies: Dg Chest 2 View  Result Date: 11/28/2017 CLINICAL DATA:  Dyspnea and chest pain with nonproductive cough and wheezing x1 week. EXAM: CHEST  2 VIEW COMPARISON:  05/22/2016 FINDINGS: Stable cardiomegaly with  moderate aortic atherosclerosis. Status post median sternotomy. Diffuse interstitial prominence and peribronchial thickening consistent bronchitic change. No alveolar consolidation. Chronic posterior costophrenic angle blunting that may represent a small effusion or pleural thickening. No acute osseous abnormality. IMPRESSION: Stable cardiomegaly with aortic atherosclerosis. Peribronchial thickening with increased interstitial lung markings suspicious for bronchitic change. Electronically Signed   By:  Ashley Royalty M.D.   On: 11/28/2017 20:14   Dg Foot Complete Right  Result Date: 11/28/2017 CLINICAL DATA:  Right foot pain and swelling. EXAM: RIGHT FOOT COMPLETE - 3+ VIEW COMPARISON:  None. FINDINGS: There is no evidence of fracture or dislocation. No focal bone erosions. Mild hallux valgus deformity with degenerative changes at the first MTP joint. Soft tissues are unremarkable. IMPRESSION: No acute bone abnormalities. Electronically Signed   By: Kerby Moors M.D.   On: 11/28/2017 17:38     Subjective: Pt says he feels a lot better, he diuresed well overnight and is down nearly 2 liters.   Discharge Exam: Vitals:   11/29/17 0810 11/29/17 0834  BP: (!) 169/55   Pulse: 65   Resp: 20   Temp: 98.6 F (37 C)   SpO2: 92% 94%   Vitals:   11/28/17 2200 11/29/17 0652 11/29/17 0810 11/29/17 0834  BP: (!) 187/74 140/60 (!) 169/55   Pulse: 90 61 65   Resp: (!) 21 20 20    Temp:  97.6 F (36.4 C) 98.6 F (37 C)   TempSrc:   Oral   SpO2: 95% 91% 92% 94%  Weight:      Height:       General: Pt is alert, awake, not in acute distress Cardiovascular: RRR, S1/S2 +, no rubs, no gallops Respiratory: CTA bilaterally, no rhonchi Abdominal: Soft, NT, ND, bowel sounds + Extremities: cellulitis of right foot /toes 3-5, mild edema, no cyanosis, good pedal pulses bilateral.   The results of significant diagnostics from this hospitalization (including imaging, microbiology, ancillary and laboratory) are  listed below for reference.     Microbiology: No results found for this or any previous visit (from the past 240 hour(s)).   Labs: BNP (last 3 results) Recent Labs    11/28/17 1914  BNP 101.7*   Basic Metabolic Panel: Recent Labs  Lab 11/28/17 1630 11/29/17 0746  NA 137 136  K 4.3 3.7  CL 103 101  CO2 26 26  GLUCOSE 299* 196*  BUN 18 17  CREATININE 0.90 0.90  CALCIUM 8.9 8.5*   Liver Function Tests: Recent Labs  Lab 11/28/17 1630  AST 41  ALT 48  ALKPHOS 120  BILITOT 1.5*  PROT 7.1  ALBUMIN 3.7   No results for input(s): LIPASE, AMYLASE in the last 168 hours. No results for input(s): AMMONIA in the last 168 hours. CBC: Recent Labs  Lab 11/28/17 1630  WBC 8.9  NEUTROABS 6.6  HGB 10.3*  HCT 32.5*  MCV 96.7  PLT 212   Cardiac Enzymes: Recent Labs  Lab 11/28/17 1913 11/29/17 0151 11/29/17 0746  TROPONINI 0.04* 0.04* 0.04*   BNP: Invalid input(s): POCBNP CBG: Recent Labs  Lab 11/28/17 2204 11/29/17 0016 11/29/17 0744  GLUCAP 226* 238* 193*   D-Dimer No results for input(s): DDIMER in the last 72 hours. Hgb A1c No results for input(s): HGBA1C in the last 72 hours. Lipid Profile No results for input(s): CHOL, HDL, LDLCALC, TRIG, CHOLHDL, LDLDIRECT in the last 72 hours. Thyroid function studies No results for input(s): TSH, T4TOTAL, T3FREE, THYROIDAB in the last 72 hours.  Invalid input(s): FREET3 Anemia work up No results for input(s): VITAMINB12, FOLATE, FERRITIN, TIBC, IRON, RETICCTPCT in the last 72 hours. Urinalysis    Component Value Date/Time   COLORURINE YELLOW 11/28/2017 1412   APPEARANCEUR CLEAR 11/28/2017 1412   LABSPEC 1.020 11/28/2017 1412   PHURINE 5.5 11/28/2017 1412   GLUCOSEU >=500 (A) 11/28/2017 1412   HGBUR  NEGATIVE 11/28/2017 1412   HGBUR negative 04/15/2008 1334   BILIRUBINUR NEGATIVE 11/28/2017 1412   KETONESUR NEGATIVE 11/28/2017 1412   PROTEINUR NEGATIVE 11/28/2017 1412   UROBILINOGEN 0.2 07/31/2013 0641    NITRITE NEGATIVE 11/28/2017 1412   LEUKOCYTESUR NEGATIVE 11/28/2017 1412   Sepsis Labs Invalid input(s): PROCALCITONIN,  WBC,  LACTICIDVEN Microbiology No results found for this or any previous visit (from the past 240 hour(s)).  Time coordinating discharge:   SIGNED:  Irwin Brakeman, MD  [facility] 11/29/2017, 9:47 AM Pager [phone]  If 7PM-7AM, please contact night-coverage [email or web address] Password [email or web address]

## 2017-11-29 NOTE — Progress Notes (Signed)
EKG completed and placed on chart 

## 2017-11-29 NOTE — Progress Notes (Signed)
IV removed and discharge papers reviewed with prescriptions sent to pharmacy and script given for pain medication.  To call offices for follow up appointments.  Taken down by wc.  Family to drive home

## 2017-11-30 LAB — HEMOGLOBIN A1C
Hgb A1c MFr Bld: 7.6 % — ABNORMAL HIGH (ref 4.8–5.6)
Mean Plasma Glucose: 171 mg/dL

## 2017-12-05 ENCOUNTER — Encounter (HOSPITAL_COMMUNITY): Payer: Self-pay | Admitting: Emergency Medicine

## 2017-12-05 ENCOUNTER — Inpatient Hospital Stay (HOSPITAL_COMMUNITY)
Admission: EM | Admit: 2017-12-05 | Discharge: 2017-12-19 | DRG: 239 | Disposition: A | Discharge status: Home Health | Payer: Medicare Other | Attending: Internal Medicine | Admitting: Internal Medicine

## 2017-12-05 ENCOUNTER — Other Ambulatory Visit: Payer: Self-pay

## 2017-12-05 ENCOUNTER — Emergency Department (HOSPITAL_COMMUNITY): Payer: Medicare Other

## 2017-12-05 DIAGNOSIS — I469 Cardiac arrest, cause unspecified: Secondary | ICD-10-CM | POA: Diagnosis not present

## 2017-12-05 DIAGNOSIS — I739 Peripheral vascular disease, unspecified: Secondary | ICD-10-CM | POA: Diagnosis present

## 2017-12-05 DIAGNOSIS — M79604 Pain in right leg: Secondary | ICD-10-CM

## 2017-12-05 DIAGNOSIS — D72829 Elevated white blood cell count, unspecified: Secondary | ICD-10-CM

## 2017-12-05 DIAGNOSIS — Z79899 Other long term (current) drug therapy: Secondary | ICD-10-CM | POA: Diagnosis not present

## 2017-12-05 DIAGNOSIS — I10 Essential (primary) hypertension: Secondary | ICD-10-CM

## 2017-12-05 DIAGNOSIS — Z794 Long term (current) use of insulin: Secondary | ICD-10-CM

## 2017-12-05 DIAGNOSIS — R740 Nonspecific elevation of levels of transaminase and lactic acid dehydrogenase [LDH]: Secondary | ICD-10-CM | POA: Diagnosis not present

## 2017-12-05 DIAGNOSIS — I25810 Atherosclerosis of coronary artery bypass graft(s) without angina pectoris: Secondary | ICD-10-CM | POA: Diagnosis not present

## 2017-12-05 DIAGNOSIS — Z87891 Personal history of nicotine dependence: Secondary | ICD-10-CM

## 2017-12-05 DIAGNOSIS — G546 Phantom limb syndrome with pain: Secondary | ICD-10-CM | POA: Diagnosis not present

## 2017-12-05 DIAGNOSIS — Z951 Presence of aortocoronary bypass graft: Secondary | ICD-10-CM

## 2017-12-05 DIAGNOSIS — E1152 Type 2 diabetes mellitus with diabetic peripheral angiopathy with gangrene: Principal | ICD-10-CM | POA: Diagnosis present

## 2017-12-05 DIAGNOSIS — I252 Old myocardial infarction: Secondary | ICD-10-CM | POA: Diagnosis not present

## 2017-12-05 DIAGNOSIS — I110 Hypertensive heart disease with heart failure: Secondary | ICD-10-CM | POA: Diagnosis present

## 2017-12-05 DIAGNOSIS — Z833 Family history of diabetes mellitus: Secondary | ICD-10-CM | POA: Diagnosis not present

## 2017-12-05 DIAGNOSIS — E785 Hyperlipidemia, unspecified: Secondary | ICD-10-CM | POA: Diagnosis present

## 2017-12-05 DIAGNOSIS — E119 Type 2 diabetes mellitus without complications: Secondary | ICD-10-CM

## 2017-12-05 DIAGNOSIS — Z7982 Long term (current) use of aspirin: Secondary | ICD-10-CM

## 2017-12-05 DIAGNOSIS — L97519 Non-pressure chronic ulcer of other part of right foot with unspecified severity: Secondary | ICD-10-CM | POA: Diagnosis present

## 2017-12-05 DIAGNOSIS — Z66 Do not resuscitate: Secondary | ICD-10-CM | POA: Diagnosis present

## 2017-12-05 DIAGNOSIS — Z8249 Family history of ischemic heart disease and other diseases of the circulatory system: Secondary | ICD-10-CM | POA: Diagnosis not present

## 2017-12-05 DIAGNOSIS — Z89611 Acquired absence of right leg above knee: Secondary | ICD-10-CM | POA: Diagnosis not present

## 2017-12-05 DIAGNOSIS — I2510 Atherosclerotic heart disease of native coronary artery without angina pectoris: Secondary | ICD-10-CM | POA: Diagnosis present

## 2017-12-05 DIAGNOSIS — S78111A Complete traumatic amputation at level between right hip and knee, initial encounter: Secondary | ICD-10-CM

## 2017-12-05 DIAGNOSIS — I5042 Chronic combined systolic (congestive) and diastolic (congestive) heart failure: Secondary | ICD-10-CM | POA: Diagnosis not present

## 2017-12-05 DIAGNOSIS — R001 Bradycardia, unspecified: Secondary | ICD-10-CM | POA: Diagnosis not present

## 2017-12-05 DIAGNOSIS — E11628 Type 2 diabetes mellitus with other skin complications: Secondary | ICD-10-CM

## 2017-12-05 DIAGNOSIS — E11649 Type 2 diabetes mellitus with hypoglycemia without coma: Secondary | ICD-10-CM | POA: Diagnosis present

## 2017-12-05 DIAGNOSIS — Z8679 Personal history of other diseases of the circulatory system: Secondary | ICD-10-CM

## 2017-12-05 DIAGNOSIS — R05 Cough: Secondary | ICD-10-CM | POA: Diagnosis not present

## 2017-12-05 DIAGNOSIS — L089 Local infection of the skin and subcutaneous tissue, unspecified: Secondary | ICD-10-CM | POA: Diagnosis not present

## 2017-12-05 DIAGNOSIS — R7401 Elevation of levels of liver transaminase levels: Secondary | ICD-10-CM

## 2017-12-05 DIAGNOSIS — Z9049 Acquired absence of other specified parts of digestive tract: Secondary | ICD-10-CM

## 2017-12-05 DIAGNOSIS — E1142 Type 2 diabetes mellitus with diabetic polyneuropathy: Secondary | ICD-10-CM | POA: Diagnosis not present

## 2017-12-05 DIAGNOSIS — H409 Unspecified glaucoma: Secondary | ICD-10-CM | POA: Diagnosis present

## 2017-12-05 DIAGNOSIS — E11621 Type 2 diabetes mellitus with foot ulcer: Secondary | ICD-10-CM | POA: Diagnosis not present

## 2017-12-05 DIAGNOSIS — M79606 Pain in leg, unspecified: Secondary | ICD-10-CM

## 2017-12-05 LAB — CBC WITH DIFFERENTIAL/PLATELET
Basophils Absolute: 0 10*3/uL (ref 0.0–0.1)
Basophils Relative: 0 %
Eosinophils Absolute: 0.4 10*3/uL (ref 0.0–0.7)
Eosinophils Relative: 5 %
HEMATOCRIT: 31.2 % — AB (ref 39.0–52.0)
Hemoglobin: 9.9 g/dL — ABNORMAL LOW (ref 13.0–17.0)
LYMPHS ABS: 1.5 10*3/uL (ref 0.7–4.0)
LYMPHS PCT: 19 %
MCH: 30.5 pg (ref 26.0–34.0)
MCHC: 31.7 g/dL (ref 30.0–36.0)
MCV: 96 fL (ref 78.0–100.0)
MONO ABS: 0.8 10*3/uL (ref 0.1–1.0)
MONOS PCT: 9 %
NEUTROS ABS: 5.3 10*3/uL (ref 1.7–7.7)
Neutrophils Relative %: 67 %
Platelets: 270 10*3/uL (ref 150–400)
RBC: 3.25 MIL/uL — ABNORMAL LOW (ref 4.22–5.81)
RDW: 13.2 % (ref 11.5–15.5)
WBC: 7.9 10*3/uL (ref 4.0–10.5)

## 2017-12-05 LAB — COMPREHENSIVE METABOLIC PANEL
ALT: 52 U/L (ref 17–63)
ANION GAP: 12 (ref 5–15)
AST: 41 U/L (ref 15–41)
Albumin: 3.5 g/dL (ref 3.5–5.0)
Alkaline Phosphatase: 126 U/L (ref 38–126)
BILIRUBIN TOTAL: 1.2 mg/dL (ref 0.3–1.2)
BUN: 18 mg/dL (ref 6–20)
CO2: 20 mmol/L — ABNORMAL LOW (ref 22–32)
Calcium: 8.7 mg/dL — ABNORMAL LOW (ref 8.9–10.3)
Chloride: 102 mmol/L (ref 101–111)
Creatinine, Ser: 1.19 mg/dL (ref 0.61–1.24)
GFR, EST NON AFRICAN AMERICAN: 58 mL/min — AB (ref >60)
Glucose, Bld: 310 mg/dL — ABNORMAL HIGH (ref 65–99)
POTASSIUM: 4.8 mmol/L (ref 3.5–5.1)
Sodium: 134 mmol/L — ABNORMAL LOW (ref 135–145)
TOTAL PROTEIN: 7.2 g/dL (ref 6.5–8.1)

## 2017-12-05 MED ORDER — DEXTROSE 5 % IV SOLN
1.0000 g | Freq: Once | INTRAVENOUS | Status: AC
  Administered 2017-12-05: 1 g via INTRAVENOUS
  Filled 2017-12-05: qty 10

## 2017-12-05 MED ORDER — OXYCODONE-ACETAMINOPHEN 5-325 MG PO TABS
1.0000 | ORAL_TABLET | Freq: Once | ORAL | Status: AC
  Administered 2017-12-05: 1 via ORAL
  Filled 2017-12-05: qty 1

## 2017-12-05 NOTE — ED Triage Notes (Signed)
Pt reports that he just left Dr Vickey Sages office and that he has been sent here to be admitted for foot wounds Reports , "they just busted open"  Pt reports that the wounds have been there for 2-3 weeks

## 2017-12-05 NOTE — ED Provider Notes (Signed)
[facility] EMERGENCY DEPARTMENT Provider Note   CSN: [id number] Arrival date & time: 12/05/17  1738     History   Chief Complaint Chief Complaint  Patient presents with  . Foot Pain    HPI Marcus Hendrix is a 76 y.o. male.  Patient complains of pain in his right foot for a couple weeks.  His primary care doctor has been treating with antibiotics for diabetic foot.  He was sent over here because he is not improving   The history is provided by the patient. No language interpreter was used.  Foot Pain  This is a new problem. The current episode started more than 1 week ago. The problem occurs constantly. The problem has not changed since onset.Pertinent negatives include no chest pain, no abdominal pain and no headaches. Nothing aggravates the symptoms. Nothing relieves the symptoms. The treatment provided no relief.    Past Medical History:  Diagnosis Date  . Cerebrovascular disease   . Diabetes mellitus   . Diabetes mellitus ([place])   . ED (erectile dysfunction)   . Glaucoma, left eye   . History of atherosclerotic cardiovascular disease   . History of myocardial infarction   . Hyperlipidemia   . Hypertension   . Inguinal hernia    Right  . Normocytic anemia   . Peripheral neuropathy   . PVD (peripheral vascular disease) ([place])   . Tubulovillous adenoma of colon     Patient Active Problem List   Diagnosis Date Noted  . Diabetic foot infection ([place]) 11/28/2017  . Acute CHF (congestive heart failure) ([place]) 11/28/2017  . Rectal bleeding 10/04/2013  . Heme positive stool 10/04/2013  . Community acquired pneumonia 07/30/2013  . DM type 2 (diabetes mellitus, type 2) ([place]) 07/30/2013  . HTN (hypertension), benign 07/30/2013  . CAROTID BRUITS, BILATERAL 07/16/2010  . MITRAL VALVE PROLAPSE 08/22/2009  . DM 07/22/2009  . Atherosclerotic cardiovascular disease 07/22/2009  . 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 ERECTILE DYSFUNCTION 05/27/2008  . INGUINAL  HERNIA, RIGHT 10/19/2007  . ANEMIA, NORMOCYTIC 10/09/2007  . Dyslipidemia 12/19/2006  . PERIPHERAL NEUROPATHY 12/19/2006  . Unspecified glaucoma 12/19/2006  . Essential hypertension 12/19/2006  . MYOCARDIAL INFARCTION, HX OF 12/19/2006  . PERIPHERAL VASCULAR DISEASE 12/19/2006    Past Surgical History:  Procedure Laterality Date  . CHOLECYSTECTOMY    . COLONOSCOPY [name] 07/22/2014   Procedure: COLONOSCOPY;  Surgeon: [name], MD;  Location: [facility] ENDO SUITE;  Service: Endoscopy;  Laterality: [name];  1235-moved to [place] notified pt  . CORONARY ARTERY BYPASS GRAFT  2002   x6  . INGUINAL HERNIA REPAIR  2004   Left       Home Medications    Prior to Admission medications   Medication Sig Start Date End Date Taking? Authorizing Provider  amLODipine (NORVASC) 10 MG tablet Take 10 mg by mouth daily.     Yes [provider]  amoxicillin-clavulanate (AUGMENTIN) 875-125 MG tablet Take 1 tablet by mouth every 12 (twelve) hours for 10 days. 11/29/17 12/09/17 Yes Johnson, Clanford L, MD  aspirin 81 MG tablet Take 81 mg by mouth daily.   Yes [provider]  atorvastatin (LIPITOR) 80 MG tablet TAKE ONE TABLET BY MOUTH ONCE DAILY. 06/17/17  Yes BranchAlphonse Guild, MD  furosemide (LASIX) 40 MG tablet Take 40 mg by mouth daily.     Yes [provider]  gabapentin (NEURONTIN) 100 MG capsule Take 100-200 mg by mouth 2 (two) times daily.  04/15/16  Yes  [provider]  guaiFENesin-dextromethorphan (ROBITUSSIN DM) 100-10 MG/5ML syrup Take 5 mLs by mouth every 4 (four) hours as needed for cough. 11/29/17  Yes Johnson, Clanford L, MD  insulin glargine (LANTUS) 100 UNIT/ML injection Inject 50 Units into the skin every morning.    Yes [provider]  lisinopril (PRINIVIL,ZESTRIL) 20 MG tablet TAKE ONE TABLET BY MOUTH TWICE DAILY. Patient taking differently: TAKE ONE TABLET BY MOUTH DAILY 01/18/14  Yes Lendon Colonel, NP  metoprolol (LOPRESSOR) 100 MG tablet  Take 100 mg by mouth 2 (two) times daily.     Yes [provider]  nitroGLYCERIN (NITROSTAT) 0.4 MG SL tablet Place 0.4 mg under the tongue every 5 (five) minutes as needed for chest pain.  04/15/16  Yes [provider]  oxyCODONE-acetaminophen (PERCOCET/ROXICET) 5-325 MG tablet Take 1 tablet by mouth every 6 (six) hours as needed for severe pain. 11/29/17  Yes Johnson, Clanford L, MD  polyethylene glycol powder (GLYCOLAX/MIRALAX) powder Take 17 g by mouth daily. 11/29/17  Yes Johnson, Clanford L, MD  potassium chloride SA (K-DUR,KLOR-CON) 20 MEQ tablet Take 20 mEq by mouth daily.   Yes [provider]  sulfamethoxazole-trimethoprim (BACTRIM DS,SEPTRA DS) 800-160 MG tablet Take 1 tablet by mouth 2 (two) times daily for 10 days. 11/29/17 12/09/17 Yes Johnson, Clanford L, MD  timolol (TIMOPTIC) 0.25 % ophthalmic solution Place 1 drop into the left eye every morning.    Yes [provider]  vitamin B-12 (CYANOCOBALAMIN) 1000 MCG tablet Take 2,000 mcg by mouth daily.   Yes [provider]    Family History Family History  Problem Relation Age of Onset  . Heart attack Mother   . Stroke Father   . Diabetes Sister   . HIV Brother   . Alcohol abuse Brother   . Diabetes Sister   . Heart attack Son     Social History Social History   Tobacco Use  . Smoking status: Former Smoker    Packs/day: 1.00    Years: 41.00    Pack years: 41.00    Types: Cigarettes    Start date: 02/06/1957    Last attempt to quit: 12/16/1997    Years since quitting: 19.9  . Smokeless tobacco: Current User    Types: Chew  . Tobacco comment: quit 12 yrs ago--10/13/15 currently a tobacco chewer for the past 25 years  Substance Use Topics  . Alcohol use: No    Alcohol/week: 0.0 oz  . Drug use: No     Allergies   Bidil [isosorb dinitrate-hydralazine]   Review of Systems Review of Systems  Constitutional: Negative for appetite change and fatigue.  HENT: Negative for  congestion, ear discharge and sinus pressure.   Eyes: Negative for discharge.  Respiratory: Negative for cough.   Cardiovascular: Negative for chest pain.  Gastrointestinal: Negative for abdominal pain and diarrhea.  Genitourinary: Negative for frequency and hematuria.  Musculoskeletal: Negative for back pain.       Right foot pain  Skin: Negative for rash.  Neurological: Negative for seizures and headaches.  Psychiatric/Behavioral: Negative for hallucinations.     Physical Exam Updated Vital Signs BP (!) 149/56 (BP Location: Right Arm)   Pulse (!) 53   Temp 99.1 F (37.3 C) (Tympanic)   Resp 16   Ht 5\' 6"  (1.676 m)   Wt 83.9 kg (185 lb)   SpO2 96%   BMI 29.86 kg/m   Physical Exam  Constitutional: He is oriented to person, place, and time. He  appears well-developed.  HENT:  Head: Normocephalic.  Eyes: Conjunctivae and EOM are normal. No scleral icterus.  Neck: Neck supple. No thyromegaly present.  Cardiovascular: Normal rate and regular rhythm. Exam reveals no gallop and no friction rub.  No murmur heard. Pulmonary/Chest: No stridor. He has no wheezes. He has no rales. He exhibits no tenderness.  Abdominal: He exhibits no distension. There is no tenderness. There is no rebound.  Musculoskeletal: Normal range of motion. He exhibits no edema.  Swollen tender red discoloration to distal foot and the third fourth and fifth toes.  Also cool foot  Lymphadenopathy:    He has no cervical adenopathy.  Neurological: He is oriented to person, place, and time. He exhibits normal muscle tone. Coordination normal.  Skin: No rash noted. No erythema.  Psychiatric: He has a normal mood and affect. His behavior is normal.     ED Treatments / Results  Labs (all labs ordered are listed, but only abnormal results are displayed) Labs Reviewed  CBC WITH DIFFERENTIAL/PLATELET - Abnormal; Notable for the following components:      Result Value   RBC 3.25 (*)    Hemoglobin 9.9 (*)    HCT  31.2 (*)    All other components within normal limits  COMPREHENSIVE METABOLIC PANEL - Abnormal; Notable for the following components:   Sodium 134 (*)    CO2 20 (*)    Glucose, Bld 310 (*)    Calcium 8.7 (*)    GFR calc non Af Amer 58 (*)    All other components within normal limits    EKG  EKG Interpretation None       Radiology Dg Foot Complete Right  Result Date: 12/05/2017 CLINICAL DATA:  Soft tissue wound right foot. EXAM: RIGHT FOOT COMPLETE - 3+ VIEW COMPARISON:  11/28/2017 FINDINGS: Mild degenerate change of the first MTP joint and midfoot. No evidence of bone destruction to suggest osteomyelitis. No air in the soft tissues. IMPRESSION: No acute findings. Electronically Signed   By: Marin Olp M.D.   On: 12/05/2017 19:19    Procedures Procedures (including critical care time)  Medications Ordered in ED Medications  cefTRIAXone (ROCEPHIN) 1 g in dextrose 5 % 50 mL IVPB (0 g Intravenous Stopped 12/05/17 2340)  oxyCODONE-acetaminophen (PERCOCET/ROXICET) 5-325 MG per tablet 1 tablet (1 tablet Oral Given 12/05/17 2305)     Initial Impression / Assessment and Plan / ED Course  I have reviewed the triage vital signs and the nursing notes.  Pertinent labs & imaging results that were available during my care of the patient were reviewed by me and considered in my medical decision making (see chart for details).   Patient with diabetic foot and poor peripheral vascular disease.  Patient will be admitted to medicine  Final Clinical Impressions(s) / ED Diagnoses   Final diagnoses:  Pain of right lower extremity    ED Discharge Orders    None       Milton Ferguson, MD 12/05/17 2348

## 2017-12-05 NOTE — H&P (Addendum)
History and Physical    Marcus Hendrix AST:[id number] DOB: [birth year] DOA: 12/05/2017  PCP: [name], MD  Patient coming from: PCP's office.  I have personally briefly reviewed patient's old medical records in [place]  Chief Complaint: Feet wounds.  HPI: Marcus Hendrix is a 76 y.o. male with medical history significant of cerebrovascular disease, ED, type 2 diabetes, left eye glaucoma, CAD, history of MI, hyperlipidemia, hypertension, right inguinal hernia, normocytic anemia, diabetic peripheral neuropathy, peripheral vascular disease who is referred from his PCP to the emergency department for further evaluation of lower extremity wounds.  He denies any trauma to the area.  He denies fever, chills, fatigue or malaise.  However, he states his appetite and sleep are decreased lately due to the discomfort of the wounds.  Denies chest pain, dyspnea, palpitations, diaphoresis, abdominal pain, diarrhea, melena or hematochezia.  He gets frequent constipation.  Denies dysuria, frequency or hematuria.  He thinks that his diabetes is with good control and denies polyuria, polydipsia, polyphagia or blurred vision.  ED Course: Initial vital signs temperature 99.21F, pulse 65, respirations 18, blood pressure 150/51 mmHg and O2 sat 96% on room air.  The patient received a Percocet 5-325 mg tablet and ceftriaxone 1 g IVPB in the emergency department.  His WBC was 7.9 with a normal differential, hemoglobin 9.9 g/dL and platelets 270.  His CMP showed a sodium of 134 and CO2 of 20 millimolar/L.  Glucose was 310, BUN 18, creatinine 1.19 and calcium 8.7 mg/dL.  LFTs were normal.  Right foot x-ray does not show any evidence of osteomyelitis.  Please see image and radiology report for further detail.  Review of Systems: As per HPI otherwise 10 point review of systems negative.    Past Medical History:  Diagnosis Date  . Cerebrovascular disease   . Diabetes mellitus   . Diabetes  mellitus ([place])   . ED (erectile dysfunction)   . Glaucoma, left eye   . History of atherosclerotic cardiovascular disease   . History of myocardial infarction   . Hyperlipidemia   . Hypertension   . Inguinal hernia    Right  . Normocytic anemia   . Peripheral neuropathy   . PVD (peripheral vascular disease) ([place])   . Tubulovillous adenoma of colon     Past Surgical History:  Procedure Laterality Date  . CHOLECYSTECTOMY    . COLONOSCOPY [name] 07/22/2014   Procedure: COLONOSCOPY;  Surgeon: [name], MD;  Location: [facility];  Service: Endoscopy;  Laterality: [name];  1235-moved to [place] notified pt  . CORONARY ARTERY BYPASS GRAFT  2002   x6  . INGUINAL HERNIA REPAIR  2004   Left     reports that he quit smoking about 19 years ago. His smoking use included cigarettes. He started smoking about 60 years ago. He has a 41.00 pack-year smoking history. His smokeless tobacco use includes chew. He reports that he does not drink alcohol or use drugs.  Allergies  Allergen Reactions  . Bidil [Isosorb Dinitrate-Hydralazine]     PASSED OUT BUT NOT SURE IF THIS WAS THE CAUSE    Family History  Problem Relation Age of Onset  . Heart attack Mother   . Stroke Father   . Diabetes Sister   . HIV Brother   . Alcohol abuse Brother   . Diabetes Sister   . Heart attack Son     Prior to Admission medications   Medication  Sig Start Date End Date Taking? Authorizing Provider  amLODipine (NORVASC) 10 MG tablet Take 10 mg by mouth daily.     Yes [provider]  amoxicillin-clavulanate (AUGMENTIN) 875-125 MG tablet Take 1 tablet by mouth every 12 (twelve) hours for 10 days. 11/29/17 12/09/17 Yes Johnson, Clanford L, MD  aspirin 81 MG tablet Take 81 mg by mouth daily.   Yes [provider]  atorvastatin (LIPITOR) 80 MG tablet TAKE ONE TABLET BY MOUTH ONCE DAILY. 06/17/17  Yes BranchAlphonse Guild, MD  furosemide (LASIX) 40 MG tablet Take 40 mg by mouth daily.     Yes  [provider]  gabapentin (NEURONTIN) 100 MG capsule Take 100-200 mg by mouth 2 (two) times daily.  04/15/16  Yes [provider]  guaiFENesin-dextromethorphan (ROBITUSSIN DM) 100-10 MG/5ML syrup Take 5 mLs by mouth every 4 (four) hours as needed for cough. 11/29/17  Yes Johnson, Clanford L, MD  insulin glargine (LANTUS) 100 UNIT/ML injection Inject 50 Units into the skin every morning.    Yes [provider]  lisinopril (PRINIVIL,ZESTRIL) 20 MG tablet TAKE ONE TABLET BY MOUTH TWICE DAILY. Patient taking differently: TAKE ONE TABLET BY MOUTH DAILY 01/18/14  Yes Lendon Colonel, NP  metoprolol (LOPRESSOR) 100 MG tablet Take 100 mg by mouth 2 (two) times daily.     Yes [provider]  nitroGLYCERIN (NITROSTAT) 0.4 MG SL tablet Place 0.4 mg under the tongue every 5 (five) minutes as needed for chest pain.  04/15/16  Yes [provider]  oxyCODONE-acetaminophen (PERCOCET/ROXICET) 5-325 MG tablet Take 1 tablet by mouth every 6 (six) hours as needed for severe pain. 11/29/17  Yes Johnson, Clanford L, MD  polyethylene glycol powder (GLYCOLAX/MIRALAX) powder Take 17 g by mouth daily. 11/29/17  Yes Johnson, Clanford L, MD  potassium chloride SA (K-DUR,KLOR-CON) 20 MEQ tablet Take 20 mEq by mouth daily.   Yes [provider]  sulfamethoxazole-trimethoprim (BACTRIM DS,SEPTRA DS) 800-160 MG tablet Take 1 tablet by mouth 2 (two) times daily for 10 days. 11/29/17 12/09/17 Yes Johnson, Clanford L, MD  timolol (TIMOPTIC) 0.25 % ophthalmic solution Place 1 drop into the left eye every morning.    Yes [provider]  vitamin B-12 (CYANOCOBALAMIN) 1000 MCG tablet Take 2,000 mcg by mouth daily.   Yes [provider]    Physical Exam: Vitals:   12/05/17 2113 12/05/17 2130 12/05/17 2230 12/05/17 2316  BP: (!) 174/65 (!) 173/62 (!) 158/57 (!) 149/56  Pulse: 77 70 73 (!) 53  Resp: 16   16  Temp:      TempSrc:      SpO2: 97% 98% 97% 96%    Weight:      Height:        Constitutional: NAD, calm, comfortable Eyes: PERRL, lids and conjunctivae normal ENMT: Mucous membranes are moist. Posterior pharynx clear of any exudate or lesions. Neck: normal, supple, no masses, no thyromegaly Respiratory: clear to auscultation bilaterally, no wheezing, no crackles. Normal respiratory effort. No accessory muscle use.  Cardiovascular: Regular rate and rhythm, 2/4 diastolic murmur, no rubs / gallops. No extremity edema. Pedal pulses nonpalpable. Positive bilateral carotid bruits.  Abdomen: Soft, no tenderness, no masses palpated. No hepatosplenomegaly. Bowel sounds positive.  Musculoskeletal: no clubbing / cyanosis.Good ROM, no contractures. Normal muscle tone.  Skin: Right foot ulcer on dorsal aspect of first MTP joint.  There is a plantar aspect wound on the first toe and purulent collection can be seen on the plantar aspect of  the remaining toes.  Please see below images for further detail. Neurologic: CN 2-12 grossly intact.  Decreased sensation on lower extremities, particularly in both feet., DTR normal. Strength 5/5 in all 4.  Psychiatric: Normal judgment and insight. Alert and oriented x 3. Normal mood.         Labs on Admission: I have personally reviewed following labs and imaging studies  CBC: Recent Labs  Lab 12/05/17 1830  WBC 7.9  NEUTROABS 5.3  HGB 9.9*  HCT 31.2*  MCV 96.0  PLT 237   Basic Metabolic Panel: Recent Labs  Lab 11/29/17 0746 12/05/17 1830  NA 136 134*  K 3.7 4.8  CL 101 102  CO2 26 20*  GLUCOSE 196* 310*  BUN 17 18  CREATININE 0.90 1.19  CALCIUM 8.5* 8.7*   GFR: Estimated Creatinine Clearance: 53.6 mL/min (by C-G formula based on SCr of 1.19 mg/dL). Liver Function Tests: Recent Labs  Lab 12/05/17 1830  AST 41  ALT 52  ALKPHOS 126  BILITOT 1.2  PROT 7.2  ALBUMIN 3.5   No results for input(s): LIPASE, AMYLASE in the last 168 hours. No results for input(s): AMMONIA in the last 168  hours. Coagulation Profile: No results for input(s): INR, PROTIME in the last 168 hours. Cardiac Enzymes: Recent Labs  Lab 11/29/17 0151 11/29/17 0746  TROPONINI 0.04* 0.04*   BNP (last 3 results) No results for input(s): PROBNP in the last 8760 hours. HbA1C: No results for input(s): HGBA1C in the last 72 hours. CBG: Recent Labs  Lab 11/29/17 0016 11/29/17 0744 11/29/17 1124  GLUCAP 238* 193* 179*   Lipid Profile: No results for input(s): CHOL, HDL, LDLCALC, TRIG, CHOLHDL, LDLDIRECT in the last 72 hours. Thyroid Function Tests: No results for input(s): TSH, T4TOTAL, FREET4, T3FREE, THYROIDAB in the last 72 hours. Anemia Panel: No results for input(s): VITAMINB12, FOLATE, FERRITIN, TIBC, IRON, RETICCTPCT in the last 72 hours. Urine analysis:    Component Value Date/Time   COLORURINE YELLOW 11/28/2017 [place] 11/28/2017 1412   LABSPEC 1.020 11/28/2017 1412   PHURINE 5.5 11/28/2017 1412   GLUCOSEU >=500 (A) 11/28/2017 1412   HGBUR NEGATIVE 11/28/2017 1412   HGBUR negative 04/15/2008 1334   BILIRUBINUR NEGATIVE 11/28/2017 1412   KETONESUR NEGATIVE 11/28/2017 1412   PROTEINUR NEGATIVE 11/28/2017 1412   UROBILINOGEN 0.2 07/31/2013 0641   NITRITE NEGATIVE 11/28/2017 1412   LEUKOCYTESUR NEGATIVE 11/28/2017 1412    Radiological Exams on Admission: Dg Foot Complete Right  Result Date: 12/05/2017 CLINICAL DATA:  Soft tissue wound right foot. EXAM: RIGHT FOOT COMPLETE - 3+ VIEW COMPARISON:  11/28/2017 FINDINGS: Mild degenerate change of the first MTP joint and midfoot. No evidence of bone destruction to suggest osteomyelitis. No air in the soft tissues. IMPRESSION: No acute findings. Electronically Signed   By: Marin Olp M.D.   On: 12/05/2017 19:19   11/29/2017 echocardiogram ------------------------------------------------------------------- LV EF: 55%  ------------------------------------------------------------------- Indications:      CHF -  428.0.  ------------------------------------------------------------------- History:   PMH:   Coronary artery disease.  Congestive heart failure.  Mitral valve disease.  Risk factors:  Hypertension. Diabetes mellitus. Dyslipidemia.  ------------------------------------------------------------------- Study Conclusions  - Left ventricle: The cavity size was at the upper limits of   normal. Wall thickness was increased in a pattern of mild LVH.   The estimated ejection fraction was 55%. Hypokinesis of the   basalinferior myocardium. Features are consistent with a   pseudonormal left ventricular filling pattern, with concomitant   abnormal  relaxation and increased filling pressure (grade 2   diastolic dysfunction). - Aortic valve: Mildly calcified annulus. Trileaflet. - Mitral valve: Mildly thickened leaflets . Prolapse, involving the   posterior leaflet. There was probable moderate regurgitation   directed eccentrically and anteriorly - difficult to assess   degree accurately. - Left atrium: The atrium was mildly dilated. - Right atrium: The atrium was mildly dilated. Central venous   pressure (est): 15 mm Hg. - Atrial septum: No defect or patent foramen ovale was identified. - Tricuspid valve: There was trivial regurgitation. - Pulmonary arteries: PA peak pressure: 40 mm Hg (S). - Pericardium, extracardiac: There was no pericardial effusion.  EKG: Independently reviewed.   Assessment/Plan Principal Problem:   Diabetic foot infection ([place]) telemetry/inpatient. Continue ceftriaxone 1 g IVPB every 24 hours. Continue Percocet for pain as needed. Continue gabapentin 100 mg p.o. twice daily. Check arterial Doppler/EUS lower extremities with ABI measurement. Consult wound care. Check CRP, ESR, pro-albumin and HIV antibody. Monitor CBC, renal function and electrolytes. Although plain films do not show any evidence of bone destruction, Consider MRI of right foot to rule out  osteomyelitis.  Active Problems:     PERIPHERAL VASCULAR DISEASE Check arterial Doppler of both extremities with ABI measurement. Consider vascular surgery evaluation depending on results.    History of atherosclerotic cardiovascular disease Denies any recent episodes of chest pain. Continue aspirin, atorvastatin and metoprolol.    Dyslipidemia Continue atorvastatin 80 mg p.o. daily.    Unspecified glaucoma Continue timolol drops. Follow-up with ophthalmology as scheduled.    DM type 2 (diabetes mellitus, type 2) (HCC) Last hemoglobin A1c 7.6% earlier this month. Carbohydrate modified diet. Continue Lantus 50 units every morning. CBG monitoring with regular insulin sliding scale in the hospital.    HTN (hypertension), benign Continue amlodipine 10 mg p.o. daily. Continue metoprolol 100 mg p.o. twice daily. Continue lisinopril 20 mg p.o. twice daily. Monitor blood pressure, heart rate, renal function and electrolytes.    Bradycardia Likely secondary to oral and ocular drops beta-blocker use. Overnight telemetry to monitor further.     DVT prophylaxis: Lovenox SQ. Code Status: Full code. Family Communication:  Disposition Plan: Admit for IV antibiotics for several days and diabetic foot workup. Consults called:  Admission status: Inpatient/telemetry.   Reubin Milan MD [facility] Pager [phone].  If 7PM-7AM, please contact night-coverage [email or web address] Password [facility]  12/05/2017, 11:41 PM

## 2017-12-05 NOTE — ED Notes (Signed)
Pt noted to have swelling in bilateral feet worse in right foot. Right foot with redness. Both feet cool to touch. Unable to palpate pulsed. Pt reports pain for 2 weeks. Noted to have blister to first metatarsal and 4th and 5th toe

## 2017-12-06 ENCOUNTER — Encounter (HOSPITAL_COMMUNITY): Payer: Self-pay | Admitting: Internal Medicine

## 2017-12-06 ENCOUNTER — Other Ambulatory Visit: Payer: Self-pay

## 2017-12-06 ENCOUNTER — Inpatient Hospital Stay (HOSPITAL_COMMUNITY): Payer: Medicare Other

## 2017-12-06 DIAGNOSIS — Z8679 Personal history of other diseases of the circulatory system: Secondary | ICD-10-CM

## 2017-12-06 DIAGNOSIS — R001 Bradycardia, unspecified: Secondary | ICD-10-CM | POA: Diagnosis present

## 2017-12-06 LAB — HEMOGLOBIN A1C
HEMOGLOBIN A1C: 7.4 % — AB (ref 4.8–5.6)
Mean Plasma Glucose: 165.68 mg/dL

## 2017-12-06 LAB — C-REACTIVE PROTEIN: CRP: 3.6 mg/dL — AB (ref <1.0)

## 2017-12-06 LAB — CBC WITH DIFFERENTIAL/PLATELET
BASOS ABS: 0 10*3/uL (ref 0.0–0.1)
BASOS PCT: 0 %
EOS ABS: 0.3 10*3/uL (ref 0.0–0.7)
Eosinophils Relative: 4 %
HEMATOCRIT: 31.7 % — AB (ref 39.0–52.0)
Hemoglobin: 9.9 g/dL — ABNORMAL LOW (ref 13.0–17.0)
Lymphocytes Relative: 21 %
Lymphs Abs: 1.5 10*3/uL (ref 0.7–4.0)
MCH: 30.3 pg (ref 26.0–34.0)
MCHC: 31.2 g/dL (ref 30.0–36.0)
MCV: 96.9 fL (ref 78.0–100.0)
MONO ABS: 0.7 10*3/uL (ref 0.1–1.0)
Monocytes Relative: 10 %
NEUTROS ABS: 4.5 10*3/uL (ref 1.7–7.7)
NEUTROS PCT: 65 %
Platelets: 281 10*3/uL (ref 150–400)
RBC: 3.27 MIL/uL — ABNORMAL LOW (ref 4.22–5.81)
RDW: 13.2 % (ref 11.5–15.5)
WBC: 7 10*3/uL (ref 4.0–10.5)

## 2017-12-06 LAB — BASIC METABOLIC PANEL
ANION GAP: 11 (ref 5–15)
BUN: 18 mg/dL (ref 6–20)
CHLORIDE: 101 mmol/L (ref 101–111)
CO2: 24 mmol/L (ref 22–32)
Calcium: 8.7 mg/dL — ABNORMAL LOW (ref 8.9–10.3)
Creatinine, Ser: 1.12 mg/dL (ref 0.61–1.24)
GFR calc Af Amer: >60 mL/min (ref >60)
GFR calc non Af Amer: >60 mL/min (ref >60)
GLUCOSE: 160 mg/dL — AB (ref 65–99)
POTASSIUM: 4.6 mmol/L (ref 3.5–5.1)
Sodium: 136 mmol/L (ref 135–145)

## 2017-12-06 LAB — SEDIMENTATION RATE: SED RATE: 54 mm/h — AB (ref 0–16)

## 2017-12-06 LAB — GLUCOSE, CAPILLARY
GLUCOSE-CAPILLARY: 281 mg/dL — AB (ref 65–99)
GLUCOSE-CAPILLARY: 104 mg/dL — AB (ref 65–99)
GLUCOSE-CAPILLARY: 199 mg/dL — AB (ref 65–99)
Glucose-Capillary: 165 mg/dL — ABNORMAL HIGH (ref 65–99)
Glucose-Capillary: 198 mg/dL — ABNORMAL HIGH (ref 65–99)

## 2017-12-06 LAB — HEPARIN LEVEL (UNFRACTIONATED): HEPARIN UNFRACTIONATED: 0.49 [IU]/mL (ref 0.30–0.70)

## 2017-12-06 LAB — PREALBUMIN: PREALBUMIN: 12.6 mg/dL — AB (ref 18–38)

## 2017-12-06 MED ORDER — ASPIRIN EC 81 MG PO TBEC
81.0000 mg | DELAYED_RELEASE_TABLET | Freq: Every day | ORAL | Status: DC
  Administered 2017-12-06 – 2017-12-19 (×13): 81 mg via ORAL
  Filled 2017-12-06 (×13): qty 1

## 2017-12-06 MED ORDER — GUAIFENESIN-DM 100-10 MG/5ML PO SYRP
5.0000 mL | ORAL_SOLUTION | ORAL | Status: DC | PRN
  Filled 2017-12-06: qty 5

## 2017-12-06 MED ORDER — POLYETHYLENE GLYCOL 3350 17 G PO PACK
17.0000 g | PACK | Freq: Every day | ORAL | Status: DC
  Administered 2017-12-07 – 2017-12-19 (×8): 17 g via ORAL
  Filled 2017-12-06 (×9): qty 1

## 2017-12-06 MED ORDER — ATORVASTATIN CALCIUM 80 MG PO TABS
80.0000 mg | ORAL_TABLET | Freq: Every day | ORAL | Status: DC
  Administered 2017-12-06 – 2017-12-14 (×7): 80 mg via ORAL
  Filled 2017-12-06 (×5): qty 1
  Filled 2017-12-06: qty 2
  Filled 2017-12-06: qty 1

## 2017-12-06 MED ORDER — INSULIN ASPART 100 UNIT/ML ~~LOC~~ SOLN
0.0000 [IU] | Freq: Three times a day (TID) | SUBCUTANEOUS | Status: DC
  Administered 2017-12-06 (×2): 3 [IU] via SUBCUTANEOUS
  Administered 2017-12-07: 8 [IU] via SUBCUTANEOUS
  Administered 2017-12-08 – 2017-12-09 (×2): 3 [IU] via SUBCUTANEOUS
  Administered 2017-12-09: 2 [IU] via SUBCUTANEOUS

## 2017-12-06 MED ORDER — HEPARIN (PORCINE) IN NACL 100-0.45 UNIT/ML-% IJ SOLN
1100.0000 [IU]/h | INTRAMUSCULAR | Status: DC
  Administered 2017-12-06 (×2): 1250 [IU]/h via INTRAVENOUS
  Administered 2017-12-07: 1100 [IU]/h via INTRAVENOUS
  Filled 2017-12-06 (×5): qty 250

## 2017-12-06 MED ORDER — GLUCERNA SHAKE PO LIQD
237.0000 mL | Freq: Two times a day (BID) | ORAL | Status: DC
  Administered 2017-12-07 – 2017-12-18 (×9): 237 mL via ORAL

## 2017-12-06 MED ORDER — INSULIN GLARGINE 100 UNIT/ML ~~LOC~~ SOLN
30.0000 [IU] | Freq: Every morning | SUBCUTANEOUS | Status: DC
  Administered 2017-12-06 – 2017-12-09 (×3): 30 [IU] via SUBCUTANEOUS
  Filled 2017-12-06 (×6): qty 0.3

## 2017-12-06 MED ORDER — FUROSEMIDE 40 MG PO TABS
40.0000 mg | ORAL_TABLET | Freq: Every day | ORAL | Status: DC
  Administered 2017-12-06 – 2017-12-19 (×12): 40 mg via ORAL
  Filled 2017-12-06 (×13): qty 1

## 2017-12-06 MED ORDER — CEFTRIAXONE SODIUM 1 G IJ SOLR
1.0000 g | INTRAMUSCULAR | Status: DC
  Administered 2017-12-06 – 2017-12-12 (×7): 1 g via INTRAVENOUS
  Filled 2017-12-06 (×10): qty 10

## 2017-12-06 MED ORDER — FENTANYL CITRATE (PF) 100 MCG/2ML IJ SOLN
50.0000 ug | Freq: Once | INTRAMUSCULAR | Status: AC
  Administered 2017-12-06: 50 ug via INTRAVENOUS
  Filled 2017-12-06: qty 2

## 2017-12-06 MED ORDER — GABAPENTIN 100 MG PO CAPS
100.0000 mg | ORAL_CAPSULE | Freq: Two times a day (BID) | ORAL | Status: DC
  Administered 2017-12-06 – 2017-12-07 (×3): 100 mg via ORAL
  Filled 2017-12-06 (×3): qty 1

## 2017-12-06 MED ORDER — HEPARIN BOLUS VIA INFUSION
4000.0000 [IU] | Freq: Once | INTRAVENOUS | Status: AC
  Administered 2017-12-06: 4000 [IU] via INTRAVENOUS
  Filled 2017-12-06: qty 4000

## 2017-12-06 MED ORDER — LISINOPRIL 20 MG PO TABS
20.0000 mg | ORAL_TABLET | Freq: Every day | ORAL | Status: DC
  Administered 2017-12-06 – 2017-12-19 (×13): 20 mg via ORAL
  Filled 2017-12-06 (×5): qty 1
  Filled 2017-12-06 (×2): qty 2
  Filled 2017-12-06 (×3): qty 1
  Filled 2017-12-06 (×3): qty 2

## 2017-12-06 MED ORDER — POLYETHYLENE GLYCOL 3350 17 GM/SCOOP PO POWD
17.0000 g | Freq: Every day | ORAL | Status: DC
  Filled 2017-12-06: qty 255

## 2017-12-06 MED ORDER — KETOROLAC TROMETHAMINE 15 MG/ML IJ SOLN
15.0000 mg | Freq: Once | INTRAMUSCULAR | Status: AC
  Administered 2017-12-06: 15 mg via INTRAVENOUS
  Filled 2017-12-06: qty 1

## 2017-12-06 MED ORDER — PRO-STAT SUGAR FREE PO LIQD
30.0000 mL | Freq: Two times a day (BID) | ORAL | Status: DC
  Administered 2017-12-06 – 2017-12-19 (×18): 30 mL via ORAL
  Filled 2017-12-06 (×21): qty 30

## 2017-12-06 MED ORDER — TIMOLOL MALEATE 0.25 % OP SOLN
1.0000 [drp] | Freq: Every morning | OPHTHALMIC | Status: DC
  Administered 2017-12-06 – 2017-12-19 (×10): 1 [drp] via OPHTHALMIC
  Filled 2017-12-06 (×5): qty 5

## 2017-12-06 MED ORDER — NITROGLYCERIN 0.4 MG SL SUBL
0.4000 mg | SUBLINGUAL_TABLET | SUBLINGUAL | Status: DC | PRN
  Administered 2017-12-12 – 2017-12-13 (×2): 0.4 mg via SUBLINGUAL
  Filled 2017-12-06 (×2): qty 1

## 2017-12-06 MED ORDER — POTASSIUM CHLORIDE CRYS ER 20 MEQ PO TBCR
20.0000 meq | EXTENDED_RELEASE_TABLET | Freq: Every day | ORAL | Status: DC
  Filled 2017-12-06: qty 1

## 2017-12-06 MED ORDER — OXYCODONE-ACETAMINOPHEN 5-325 MG PO TABS
1.0000 | ORAL_TABLET | Freq: Four times a day (QID) | ORAL | Status: DC | PRN
  Administered 2017-12-06 – 2017-12-07 (×6): 1 via ORAL
  Filled 2017-12-06 (×6): qty 1

## 2017-12-06 MED ORDER — INSULIN GLARGINE 100 UNIT/ML ~~LOC~~ SOLN
50.0000 [IU] | Freq: Every morning | SUBCUTANEOUS | Status: DC
  Filled 2017-12-06 (×3): qty 0.5

## 2017-12-06 MED ORDER — AMLODIPINE BESYLATE 10 MG PO TABS
10.0000 mg | ORAL_TABLET | Freq: Every day | ORAL | Status: DC
  Administered 2017-12-06 – 2017-12-19 (×13): 10 mg via ORAL
  Filled 2017-12-06: qty 1
  Filled 2017-12-06: qty 2
  Filled 2017-12-06 (×11): qty 1

## 2017-12-06 MED ORDER — INSULIN ASPART 100 UNIT/ML ~~LOC~~ SOLN
4.0000 [IU] | Freq: Once | SUBCUTANEOUS | Status: AC
  Administered 2017-12-06: 4 [IU] via SUBCUTANEOUS

## 2017-12-06 MED ORDER — ENOXAPARIN SODIUM 40 MG/0.4ML ~~LOC~~ SOLN
40.0000 mg | SUBCUTANEOUS | Status: DC
  Filled 2017-12-06: qty 0.4

## 2017-12-06 MED ORDER — METOPROLOL TARTRATE 100 MG PO TABS
100.0000 mg | ORAL_TABLET | Freq: Two times a day (BID) | ORAL | Status: DC
  Administered 2017-12-06 – 2017-12-18 (×24): 100 mg via ORAL
  Filled 2017-12-06 (×9): qty 1
  Filled 2017-12-06: qty 2
  Filled 2017-12-06 (×4): qty 1
  Filled 2017-12-06: qty 2
  Filled 2017-12-06 (×11): qty 1

## 2017-12-06 NOTE — Progress Notes (Signed)
PROGRESS NOTE  Marcus Hendrix BBC:[id number] DOB: December 24, 1940 DOA: 12/05/2017 PCP: [name], MD  Brief History:  76 year old male with a history of diabetes mellitus, coronary artery disease, hyperlipidemia, hypertension, peripheral vascular disease, presenting with approximately 3-week history of right foo swelling, and drainage.  Patient does not recall any recent injury.  The patient was actually admitted to the hospital from November 28, 2017 through November 29, 2017 for chest pain during which time the patient was also treated for cellulitis of the right lower extremity.  He was discharged home with Augmentin and endorsed compliance with the medication.  He went to see his primary care provider on December 05, 2017 who noted increasing pain, edema and erythema.  As result, the patient was sent to the emergency department for further evaluation.  He states that right foot pain has been his major complaint for the past week.  He denies any fevers, chills, chest pain, shortness breath, coughing, hemoptysis, nausea, vomiting, diarrhea.  At the time of admission, WBC was 7.9, and the patient was afebrile hemodynamically stable.  Upon evaluation on the morning of December 06, 2017, the patient's right foot was noted to be cool to the touch.  In addition, the patient stated that for the past week he has had 2 dangle his right lower extremity to gravity to get relief of his pain.  He has also noted some claudication type symptoms.  Assessment/Plan: Ischemia to the right foot - I feel that the majority of his symptoms is attributable to ischemia to the right lower extremity -I have discussed the case with [facility], Dr. Trula Slade, who will see patient after transfer to [facility] -start IV heparin after discussion with Dr. Trula Slade -Check ABIs -Venous duplex to rule out DVT -Pain control with Percocet and gabapentin  Diabetic foot ulcer -Please see pictures below -will not start  antibiotics--afebrile, hemodynamically stable, no leukocytosis -wound care consult -MRI foot -ESR, CRP  Diabetes mellitus type 2 -Reduced dose Lantus -NovoLog sliding scale -11/29/2017 hemoglobin A1c 7.6  Essential hypertension -Continue amlodipine, metoprolol  Chronic diastolic CHF -He appears to be clinically compensated -Continue home dose of furosemide -November 29, 2017 echo EF 55%, grade 2 DD  Hyperlipidemia -Continue statin  Coronary artery disease with history of CABG -No chest pain presently -Continue aspirin, metoprolol, statin    Disposition Plan:   Transfer to [facility]  Family Communication:   Family at bedside  Consultants:    Code Status:  FULL / DNR  DVT Prophylaxis:  IV Heparin   Procedures: As Listed in Progress Note Above  Antibiotics: Ceftriaxone x 1-- 12/21    Subjective: Patient complains of right foot pain having to dangle his right lower extremity over the bed.  He denies any fevers, chills, chest pain, shortness breath, nausea, vomiting, diarrhea, abdominal pain.  No dysuria or hematuria.  Objective: Vitals:   12/05/17 2316 12/06/17 0033 12/06/17 0055 12/06/17 0621  BP: (!) 149/56 (!) 184/50  (!) 147/59  Pulse: (!) 53 67  69  Resp: '16 17  16  ' Temp:  98.5 F (36.9 C)  98.3 F (36.8 C)  TempSrc:  Oral  Oral  SpO2: 96% 92% 92% 99%  Weight:  82.2 kg (181 lb 3.5 oz)    Height:  '5\' 6"'  (1.676 m)      Intake/Output Summary (Last 24 hours) at 12/06/2017 5038 Last data filed at 12/06/2017 0500 Gross per 24 hour  Intake 50 ml  Output  300 ml  Net -250 ml   Weight change:  Exam:   General:  Pt is alert, follows commands appropriately, not in acute distress  HEENT: No icterus, No thrush, No neck mass, [place]/AT  Cardiovascular: RRR, S1/S2, no rubs, no gallops  Respiratory: CTA bilaterally, no wheezing, no crackles, no rhonchi  Abdomen: Soft/+BS, non tender, non distended, no guarding  Extremities: 2+LE edema, No lymphangitis, No  petechiae, No rashes, no synovitis; see pictures below or right foot       Data Reviewed: I have personally reviewed following labs and imaging studies Basic Metabolic Panel: Recent Labs  Lab 11/29/17 0746 12/05/17 1830 12/06/17 0512  NA 136 134* 136  K 3.7 4.8 4.6  CL 101 102 101  CO2 26 20* 24  GLUCOSE 196* 310* 160*  BUN '17 18 18  ' CREATININE 0.90 1.19 1.12  CALCIUM 8.5* 8.7* 8.7*   Liver Function Tests: Recent Labs  Lab 12/05/17 1830  AST 41  ALT 52  ALKPHOS 126  BILITOT 1.2  PROT 7.2  ALBUMIN 3.5   No results for input(s): LIPASE, AMYLASE in the last 168 hours. No results for input(s): AMMONIA in the last 168 hours. Coagulation Profile: No results for input(s): INR, PROTIME in the last 168 hours. CBC: Recent Labs  Lab 12/05/17 1830 12/06/17 0512  WBC 7.9 7.0  NEUTROABS 5.3 4.5  HGB 9.9* 9.9*  HCT 31.2* 31.7*  MCV 96.0 96.9  PLT 270 281   Cardiac Enzymes: Recent Labs  Lab 11/29/17 0746  TROPONINI 0.04*   BNP: Invalid input(s): POCBNP CBG: Recent Labs  Lab 11/29/17 0744 11/29/17 1124 12/06/17 0223  GLUCAP 193* 179* 281*   HbA1C: No results for input(s): HGBA1C in the last 72 hours. Urine analysis:    Component Value Date/Time   COLORURINE YELLOW 11/28/2017 1412   APPEARANCEUR CLEAR 11/28/2017 1412   LABSPEC 1.020 11/28/2017 1412   PHURINE 5.5 11/28/2017 1412   GLUCOSEU >=500 (A) 11/28/2017 1412   HGBUR NEGATIVE 11/28/2017 1412   HGBUR negative 04/15/2008 1334   BILIRUBINUR NEGATIVE 11/28/2017 1412   KETONESUR NEGATIVE 11/28/2017 1412   PROTEINUR NEGATIVE 11/28/2017 1412   UROBILINOGEN 0.2 07/31/2013 0641   NITRITE NEGATIVE 11/28/2017 1412   LEUKOCYTESUR NEGATIVE 11/28/2017 1412   Sepsis Labs: '@LABRCNTIP' (procalcitonin:4,lacticidven:4) )No results found for this or any previous visit (from the past 240 hour(s)).   Scheduled Meds: . amLODipine  10 mg Oral Daily  . aspirin EC  81 mg Oral Daily  . atorvastatin  80 mg Oral Daily   . enoxaparin (LOVENOX) injection  40 mg Subcutaneous Q24H  . furosemide  40 mg Oral Daily  . gabapentin  100 mg Oral BID  . insulin aspart  0-15 Units Subcutaneous TID WC  . insulin glargine  50 Units Subcutaneous q morning - 10a  . lisinopril  20 mg Oral Daily  . metoprolol tartrate  100 mg Oral BID  . polyethylene glycol powder  17 g Oral Daily  . potassium chloride SA  20 mEq Oral Daily  . timolol  1 drop Left Eye q morning - 10a   Continuous Infusions:  Procedures/Studies: Dg Chest 2 View  Result Date: 11/28/2017 CLINICAL DATA:  Dyspnea and chest pain with nonproductive cough and wheezing x1 week. EXAM: CHEST  2 VIEW COMPARISON:  05/22/2016 FINDINGS: Stable cardiomegaly with moderate aortic atherosclerosis. Status post median sternotomy. Diffuse interstitial prominence and peribronchial thickening consistent bronchitic change. No alveolar consolidation. Chronic posterior costophrenic angle blunting that may represent a small effusion  or pleural thickening. No acute osseous abnormality. IMPRESSION: Stable cardiomegaly with aortic atherosclerosis. Peribronchial thickening with increased interstitial lung markings suspicious for bronchitic change. Electronically Signed   By: Ashley Royalty M.D.   On: 11/28/2017 20:14   Dg Foot Complete Right  Result Date: 12/05/2017 CLINICAL DATA:  Soft tissue wound right foot. EXAM: RIGHT FOOT COMPLETE - 3+ VIEW COMPARISON:  11/28/2017 FINDINGS: Mild degenerate change of the first MTP joint and midfoot. No evidence of bone destruction to suggest osteomyelitis. No air in the soft tissues. IMPRESSION: No acute findings. Electronically Signed   By: Marin Olp M.D.   On: 12/05/2017 19:19   Dg Foot Complete Right  Result Date: 11/28/2017 CLINICAL DATA:  Right foot pain and swelling. EXAM: RIGHT FOOT COMPLETE - 3+ VIEW COMPARISON:  None. FINDINGS: There is no evidence of fracture or dislocation. No focal bone erosions. Mild hallux valgus deformity with  degenerative changes at the first MTP joint. Soft tissues are unremarkable. IMPRESSION: No acute bone abnormalities. Electronically Signed   By: Kerby Moors M.D.   On: 11/28/2017 17:38    Orson Eva, DO  [facility] Pager [phone]  If 7PM-7AM, please contact night-coverage [email or web address] Password [email or web address] 12/06/2017, 7:12 AM   LOS: 1 day

## 2017-12-06 NOTE — Progress Notes (Signed)
Initial Nutrition Assessment  DOCUMENTATION CODES:  Not applicable  INTERVENTION:  Glucerna Shake po BID, each supplement provides 220 kcal and 10 grams of protein  Will order 30 mL Prostat BID, each supplement provides 100 kcal and 15 grams of protein.  NUTRITION DIAGNOSIS:  Inadequate oral intake related to Poor appetite associated w/ pain in RLE Wound as evidenced by per patient report  GOAL:  Patient will meet greater than or equal to 90% of their needs  MONITOR:  PO intake, Supplement acceptance, Labs, Weight trends  REASON FOR ASSESSMENT:  Consult Wound healing  ASSESSMENT:  76 y/o male PMHx cerebrovascular disease, DM2, CAD s/ CABG, HF, MI, HLD/HTN, PVD. Presents from PCP appointment due to LLE wound. Has had decrease appetite and sleeping poorly due to wound associated discomfort. Admitted for management of diabetic foot infection. RD consulted for wound healing.   RD operating remotely on weekends. Per chart, the patient has had decreased intake r/t wounds and other notes indicate he has had wounds for 2-3 weeks.   Pt was briefly admitted 1 week ago 12/14- 12/15.  has lost weight since then, however, that admission was related to HF and he was diuresed well that admission  Per MD evaluation this AM, patient's foot is cool to touch and is experiencing symptoms felt to be consistent with ischemia. Patient to be transferred to [facility] for [facility] consult.   Nutritional support will have minimal impact if wounds related to ischemia. However, given decrease in appetite, will order Glucerna BID and prostat.    Labs: A1C 12/15 7.6 (~171 mg/dl) Meds: Lasix, insulin, Miralax  Recent Labs  Lab 12/05/17 1830 12/06/17 0512  NA 134* 136  K 4.8 4.6  CL 102 101  CO2 20* 24  BUN 18 18  CREATININE 1.19 1.12  CALCIUM 8.7* 8.7*  GLUCOSE 310* 160*   NUTRITION - FOCUSED PHYSICAL EXAM: Unable to assess  Diet Order:  Diet heart healthy/carb modified Room service appropriate? Yes; Fluid  consistency: Thin  EDUCATION NEEDS:  No education needs have been identified at this time  Skin: Wound to R foot (ischemic vs diabetic)    Last BM:  12/21  Height:  Ht Readings from Last 1 Encounters:  12/06/17 5\' 6"  (1.676 m)   Weight:  Wt Readings from Last 1 Encounters:  12/06/17 181 lb 3.5 oz (82.2 kg)   Wt Readings from Last 10 Encounters:  12/06/17 181 lb 3.5 oz (82.2 kg)  11/28/17 185 lb (83.9 kg)  05/22/16 190 lb (86.2 kg)  10/13/15 195 lb (88.5 kg)  08/25/15 188 lb (85.3 kg)  07/12/14 189 lb 6.4 oz (85.9 kg)  06/19/14 185 lb (83.9 kg)  10/04/13 195 lb 11.2 oz (88.8 kg)  07/30/13 201 lb 15.1 oz (91.6 kg)  06/25/13 200 lb 4 oz (90.8 kg)   Ideal Body Weight:  64.55 kg  BMI:  Body mass index is 29.25 kg/m.  Estimated Nutritional Needs:  Kcal:  1900-2050 kcals (22-25 kcal/kg bw) Protein:  77-90g (1.2-1.4 g/kg bw) Fluid:  1.9-2.1 L fluid (1 ml/kcal)  Burtis Junes RD, LDN, CNSC Clinical Nutrition Pager: [phone] 12/06/2017 9:40 AM

## 2017-12-06 NOTE — Progress Notes (Signed)
Attempted to call report to [facility] 6 N nurse. She was at lunch and Network engineer said she would call me back.  [facility] here to pick up pt.

## 2017-12-06 NOTE — Consult Note (Signed)
Patient currently in MRI.  I will check back later tonight or tomorrow am.  Marcus Hendrix

## 2017-12-06 NOTE — Progress Notes (Signed)
Called report to [place] nurse. Pt sent with all personal belongings.

## 2017-12-06 NOTE — Progress Notes (Signed)
ANTICOAGULATION CONSULT NOTE - Initial Consult  Pharmacy Consult for heparin Indication: ischemic right foot  Allergies  Allergen Reactions  . Bidil [Isosorb Dinitrate-Hydralazine]     PASSED OUT BUT NOT SURE IF THIS WAS THE CAUSE    Patient Measurements: Height: 5\' 6"  (167.6 cm) Weight: 181 lb 3.5 oz (82.2 kg) IBW/kg (Calculated) : 63.8 Heparin Dosing Weight: 80 kg  Vital Signs: Temp: 98 F (36.7 C) (12/22 1958) Temp Source: Oral (12/22 1958) BP: 149/60 (12/22 1958) Pulse Rate: 64 (12/22 1958)  Labs: Recent Labs    12/05/17 1830 12/06/17 0512 12/06/17 1850  HGB 9.9* 9.9*  --   HCT 31.2* 31.7*  --   PLT 270 281  --   HEPARINUNFRC  --   --  0.49  CREATININE 1.19 1.12  --     Estimated Creatinine Clearance: 56.5 mL/min (by C-G formula based on SCr of 1.12 mg/dL).   Assessment: 69 YOM to start heparin for ischemic right foot.  He was not on anticoagulation PTA.    Heparin level this evening is therapeutic (HL 0.49, goal of 0.3-0.7). No overt s/sx of bleeding noted at this time.   Goal of Therapy:  Heparin level 0.3-0.7 units/ml Monitor platelets by anticoagulation protocol: Yes   Plan:  1. Continue Heparin at 1250 units/hr (12.5 ml/hr) 2. Will continue to monitor for any signs/symptoms of bleeding and will follow up with heparin level in 8 hours to confirm therapeutic  Thank you for allowing pharmacy to be a part of this patient's care.  Alycia Rossetti, PharmD, BCPS Clinical Pharmacist Pager: [phone] If after 3:30p, please call main pharmacy at: [phone] 12/06/2017 8:51 PM

## 2017-12-06 NOTE — Progress Notes (Signed)
Patient arrived to 6n6, alert and oriented, IV infusion of heparin going, VSS, able to ambulate with assistance to bathroom. Patient noted to have a couple open sores on right foot, oh which, is moderately swollen and red as well. Oriented to room and staff, will continue to monitor. No family present at the moment.

## 2017-12-06 NOTE — Progress Notes (Signed)
ANTICOAGULATION CONSULT NOTE - Initial Consult  Pharmacy Consult for heparin Indication: ischemic right foot  Allergies  Allergen Reactions  . Bidil [Isosorb Dinitrate-Hydralazine]     PASSED OUT BUT NOT SURE IF THIS WAS THE CAUSE    Patient Measurements: Height: 5\' 6"  (167.6 cm) Weight: 181 lb 3.5 oz (82.2 kg) IBW/kg (Calculated) : 63.8 Heparin Dosing Weight: 80 kg  Vital Signs: Temp: 98.3 F (36.8 C) (12/22 0621) Temp Source: Oral (12/22 0621) BP: 147/59 (12/22 0621) Pulse Rate: 69 (12/22 0621)  Labs: Recent Labs    12/05/17 1830 12/06/17 0512  HGB 9.9* 9.9*  HCT 31.2* 31.7*  PLT 270 281  CREATININE 1.19 1.12    Estimated Creatinine Clearance: 56.5 mL/min (by C-G formula based on SCr of 1.12 mg/dL).   Medical History: Past Medical History:  Diagnosis Date  . Cerebrovascular disease   . Diabetes mellitus   . Diabetes mellitus ([place])   . ED (erectile dysfunction)   . Glaucoma, left eye   . History of atherosclerotic cardiovascular disease   . History of myocardial infarction   . Hyperlipidemia   . Hypertension   . Inguinal hernia    Right  . Normocytic anemia   . Peripheral neuropathy   . PVD (peripheral vascular disease) ([place])   . Tubulovillous adenoma of colon     Medications:  Medications Prior to Admission  Medication Sig Dispense Refill Last Dose  . amLODipine (NORVASC) 10 MG tablet Take 10 mg by mouth daily.     12/05/2017 at Unknown time  . amoxicillin-clavulanate (AUGMENTIN) 875-125 MG tablet Take 1 tablet by mouth every 12 (twelve) hours for 10 days. 20 tablet 0 12/05/2017 at Unknown time  . aspirin 81 MG tablet Take 81 mg by mouth daily.   12/05/2017 at Unknown time  . atorvastatin (LIPITOR) 80 MG tablet TAKE ONE TABLET BY MOUTH ONCE DAILY. 15 tablet 0 12/05/2017 at Unknown time  . furosemide (LASIX) 40 MG tablet Take 40 mg by mouth daily.     12/05/2017 at Unknown time  . gabapentin (NEURONTIN) 100 MG capsule Take 100-200 mg by mouth 2 (two)  times daily.   10 12/05/2017 at Unknown time  . guaiFENesin-dextromethorphan (ROBITUSSIN DM) 100-10 MG/5ML syrup Take 5 mLs by mouth every 4 (four) hours as needed for cough. 118 mL 0 unknown  . insulin glargine (LANTUS) 100 UNIT/ML injection Inject 50 Units into the skin every morning.    12/05/2017 at Unknown time  . lisinopril (PRINIVIL,ZESTRIL) 20 MG tablet TAKE ONE TABLET BY MOUTH TWICE DAILY. (Patient taking differently: TAKE ONE TABLET BY MOUTH DAILY) 90 tablet 3 12/05/2017 at Unknown time  . metoprolol (LOPRESSOR) 100 MG tablet Take 100 mg by mouth 2 (two) times daily.     12/05/2017 at 10am  . nitroGLYCERIN (NITROSTAT) 0.4 MG SL tablet Place 0.4 mg under the tongue every 5 (five) minutes as needed for chest pain.   10 unknown  . oxyCODONE-acetaminophen (PERCOCET/ROXICET) 5-325 MG tablet Take 1 tablet by mouth every 6 (six) hours as needed for severe pain. 12 tablet 0 12/04/2017 at Unknown time  . polyethylene glycol powder (GLYCOLAX/MIRALAX) powder Take 17 g by mouth daily. 255 g 0 12/05/2017 at Unknown time  . potassium chloride SA (K-DUR,KLOR-CON) 20 MEQ tablet Take 20 mEq by mouth daily.   12/05/2017 at Unknown time  . sulfamethoxazole-trimethoprim (BACTRIM DS,SEPTRA DS) 800-160 MG tablet Take 1 tablet by mouth 2 (two) times daily for 10 days. 20 tablet 0 12/05/2017 at Unknown time  .  timolol (TIMOPTIC) 0.25 % ophthalmic solution Place 1 drop into the left eye every morning.    12/05/2017 at Unknown time  . vitamin B-12 (CYANOCOBALAMIN) 1000 MCG tablet Take 2,000 mcg by mouth daily.   12/05/2017 at Unknown time    Assessment: 76 yo man to start heparin for ischemic right foot.  He was not on anticoagulation PTA.   Goal of Therapy:  Heparin level 0.3-0.7 units/ml Monitor platelets by anticoagulation protocol: Yes   Plan:  Heparin 4000 unit bolus and drip at 1250 units/hr Check heparin level 6-8 hours after start Daily heparin level and CBC while on heparin  Doretta Remmert  [place] 12/06/2017,8:52 AM

## 2017-12-06 NOTE — Progress Notes (Signed)
Inpatient Diabetes Program Recommendations  AACE/ADA: New Consensus Statement on Inpatient Glycemic Control (2015)  Target Ranges:  Prepandial:   less than 140 mg/dL      Peak postprandial:   less than 180 mg/dL (1-2 hours)      Critically ill patients:  140 - 180 mg/dL   Results for Marcus Hendrix, Marcus Hendrix (MRN [id number]) as of 12/06/2017 21:25  Ref. Range 12/06/2017 02:23 12/06/2017 08:35 12/06/2017 11:34 12/06/2017 16:39  Glucose-Capillary Latest Ref Range: 65 - 99 mg/dL 281 (H) 165 (H) 198 (H) 104 (H)   Results for Marcus Hendrix, Marcus Hendrix (MRN [id number]) as of 12/06/2017 21:25  Ref. Range 12/06/2017 05:13  Hemoglobin A1C Latest Ref Range: 4.8 - 5.6 % 7.4 (H)   Admit with: Diabetic Foot Ulcer  History: DM  Home DM Meds: Lantus 50 units daily  Current Insulin Orders: Lantus 30 units daily      Novolog Moderate Correction Scale/ SSI (0-15 units) TID AC       Note Lantus 30 units daily started this AM along with Novolog SSI.  CBGs improved since Lantus now on board.  Current A1c WNL.     --Will follow patient during hospitalization--  Wyn Quaker RN, MSN, CDE Diabetes Coordinator Inpatient Glycemic Control Team Team Pager: [phone] (8a-5p)

## 2017-12-07 DIAGNOSIS — E11621 Type 2 diabetes mellitus with foot ulcer: Secondary | ICD-10-CM

## 2017-12-07 DIAGNOSIS — I739 Peripheral vascular disease, unspecified: Secondary | ICD-10-CM

## 2017-12-07 LAB — CBC WITH DIFFERENTIAL/PLATELET
BASOS ABS: 0 10*3/uL (ref 0.0–0.1)
BASOS PCT: 0 %
Eosinophils Absolute: 0.3 10*3/uL (ref 0.0–0.7)
Eosinophils Relative: 4 %
HEMATOCRIT: 32.4 % — AB (ref 39.0–52.0)
HEMOGLOBIN: 10.2 g/dL — AB (ref 13.0–17.0)
Lymphocytes Relative: 24 %
Lymphs Abs: 1.8 10*3/uL (ref 0.7–4.0)
MCH: 29.6 pg (ref 26.0–34.0)
MCHC: 31.5 g/dL (ref 30.0–36.0)
MCV: 93.9 fL (ref 78.0–100.0)
MONO ABS: 0.7 10*3/uL (ref 0.1–1.0)
Monocytes Relative: 9 %
NEUTROS ABS: 4.9 10*3/uL (ref 1.7–7.7)
NEUTROS PCT: 63 %
Platelets: 275 10*3/uL (ref 150–400)
RBC: 3.45 MIL/uL — ABNORMAL LOW (ref 4.22–5.81)
RDW: 13.2 % (ref 11.5–15.5)
WBC: 7.8 10*3/uL (ref 4.0–10.5)

## 2017-12-07 LAB — BASIC METABOLIC PANEL
ANION GAP: 8 (ref 5–15)
BUN: 14 mg/dL (ref 6–20)
CALCIUM: 8.5 mg/dL — AB (ref 8.9–10.3)
CO2: 23 mmol/L (ref 22–32)
Chloride: 102 mmol/L (ref 101–111)
Creatinine, Ser: 0.99 mg/dL (ref 0.61–1.24)
Glucose, Bld: 156 mg/dL — ABNORMAL HIGH (ref 65–99)
Potassium: 4.3 mmol/L (ref 3.5–5.1)
SODIUM: 133 mmol/L — AB (ref 135–145)

## 2017-12-07 LAB — GLUCOSE, CAPILLARY
GLUCOSE-CAPILLARY: 157 mg/dL — AB (ref 65–99)
Glucose-Capillary: 260 mg/dL — ABNORMAL HIGH (ref 65–99)
Glucose-Capillary: 112 mg/dL — ABNORMAL HIGH (ref 65–99)
Glucose-Capillary: 150 mg/dL — ABNORMAL HIGH (ref 65–99)

## 2017-12-07 LAB — HEPARIN LEVEL (UNFRACTIONATED)
HEPARIN UNFRACTIONATED: 0.66 [IU]/mL (ref 0.30–0.70)
Heparin Unfractionated: 0.83 IU/mL — ABNORMAL HIGH (ref 0.30–0.70)

## 2017-12-07 LAB — MRSA PCR SCREENING: MRSA by PCR: NEGATIVE

## 2017-12-07 LAB — HIV ANTIBODY (ROUTINE TESTING W REFLEX): HIV SCREEN 4TH GENERATION: NONREACTIVE

## 2017-12-07 MED ORDER — OXYCODONE HCL 5 MG PO TABS
10.0000 mg | ORAL_TABLET | ORAL | Status: DC | PRN
  Administered 2017-12-07 – 2017-12-19 (×40): 10 mg via ORAL
  Filled 2017-12-07 (×41): qty 2

## 2017-12-07 MED ORDER — ZOLPIDEM TARTRATE 5 MG PO TABS
5.0000 mg | ORAL_TABLET | Freq: Every evening | ORAL | Status: DC | PRN
  Administered 2017-12-14 – 2017-12-18 (×5): 5 mg via ORAL
  Filled 2017-12-07 (×6): qty 1

## 2017-12-07 MED ORDER — MORPHINE SULFATE (PF) 4 MG/ML IV SOLN
1.0000 mg | INTRAVENOUS | Status: DC | PRN
  Administered 2017-12-07 – 2017-12-14 (×4): 1 mg via INTRAVENOUS
  Filled 2017-12-07 (×4): qty 1

## 2017-12-07 MED ORDER — GABAPENTIN 100 MG PO CAPS
100.0000 mg | ORAL_CAPSULE | Freq: Three times a day (TID) | ORAL | Status: DC
  Administered 2017-12-07 – 2017-12-18 (×30): 100 mg via ORAL
  Filled 2017-12-07 (×32): qty 1

## 2017-12-07 NOTE — Progress Notes (Signed)
PROGRESS NOTE  Marcus Hendrix OEH:[id number] DOB: November 03, 1941 DOA: 12/05/2017 PCP: [name], MD  Brief History:  76 year old male with a history of diabetes mellitus, coronary artery disease, hyperlipidemia, hypertension, peripheral vascular disease, presenting with approximately 3-week history of right foo swelling, and drainage.  Patient does not recall any recent injury.  The patient was actually admitted to the hospital from November 28, 2017 through November 29, 2017 for chest pain during which time the patient was also treated for cellulitis of the right lower extremity.  He was discharged home with Augmentin and endorsed compliance with the medication.  He went to see his primary care provider on December 05, 2017 who noted increasing pain, edema and erythema.  As result, the patient was sent to the emergency department for further evaluation.  He states that right foot pain has been his major complaint for the past week.  He denies any fevers, chills, chest pain, shortness breath, coughing, hemoptysis, nausea, vomiting, diarrhea.  At the time of admission, WBC was 7.9, and the patient was afebrile hemodynamically stable.  Upon evaluation on the morning of December 06, 2017, the patient's right foot was noted to be cool to the touch.  In addition, the patient stated that for the past week he has had 2 dangle his right lower extremity to gravity to get relief of his pain.  He has also noted some claudication type symptoms.  Assessment/Plan: Ischemia to the right foot -Agree that patient's symptoms show more PVD as a primary cause of presentation with possible critical limb ischemia. -start IV heparin after discussion with Dr. Trula Slade -Check ABIs -Venous duplex to rule out DVT -Pain control with Percocet and gabapentin  Diabetic foot ulcer -Please see pictures below -will not start antibiotics--afebrile, hemodynamically stable, no leukocytosis -wound care consult -MRI  foot negative for any osteomyelitis, shows edema and insufficiency due to poor vasculature. 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 Family Communication:  no Family at bedside  Consultants:    Code Status:  FULL / DNR  DVT Prophylaxis:  IV Heparin   Procedures: As Listed in Progress Note Above  Antibiotics: Ceftriaxone x 1-- 12/21    Subjective: Continues to have right foot pain.  Also has complained about inability to sleep last night.  No nausea no vomiting.  Wants to go home.  Objective: Vitals:   12/06/17 1310 12/06/17 1958 12/07/17 0526 12/07/17 1500  BP: 133/67 (!) 149/60 (!) 166/71 (!) 164/59  Pulse: 60 64 60 61  Resp: '14 16 17 16  ' Temp: 97.8 F (36.6 C) 98 F (36.7 C) 98.6 F (37 C) 98.8 F (37.1 C)  TempSrc: Oral Oral Oral Oral  SpO2: 97% 100% 99% 97%  Weight:      Height:        Intake/Output Summary (Last 24 hours) at 12/07/2017 1545 Last data filed at 12/07/2017 1529 Gross per 24 hour  Intake 1132.79 ml  Output 952 ml  Net 180.79 ml   Weight change:  Exam:   General:  Pt is alert, follows commands appropriately, not in acute distress  HEENT: No icterus, No thrush, No neck mass, [place]/AT  Cardiovascular: RRR, S1/S2, no rubs, no gallops  Respiratory: CTA bilaterally, no wheezing, no crackles, no rhonchi  Abdomen: Soft/+BS, non tender, non distended, no guarding  Extremities: 2+LE edema, No lymphangitis, No petechiae, No rashes, no  synovitis; see pictures below or right foot       Data Reviewed: I have personally reviewed following labs and imaging studies Basic Metabolic Panel: Recent Labs  Lab 12/05/17 1830 12/06/17 0512 12/07/17 0431  NA 134* 136 133*  K 4.8 4.6 4.3  CL 102 101 102  CO2 20* 24 23  GLUCOSE 310* 160* 156*  BUN '18 18 14  ' CREATININE 1.19 1.12 0.99  CALCIUM 8.7* 8.7* 8.5*   Liver Function Tests: Recent Labs  Lab 12/05/17 1830  AST 41  ALT 52  ALKPHOS 126  BILITOT 1.2  PROT 7.2  ALBUMIN 3.5   No results for input(s): LIPASE, AMYLASE in the last 168 hours. No results for input(s): AMMONIA in the last 168 hours. Coagulation Profile: No results for input(s): INR, PROTIME in the last 168 hours. CBC: Recent Labs  Lab 12/05/17 1830 12/06/17 0512 12/07/17 0431  WBC 7.9 7.0 7.8  NEUTROABS 5.3 4.5 4.9  HGB 9.9* 9.9* 10.2*  HCT 31.2* 31.7* 32.4*  MCV 96.0 96.9 93.9  PLT 270 281 275   Cardiac Enzymes: No results for input(s): CKTOTAL, CKMB, CKMBINDEX, TROPONINI in the last 168 hours. BNP: Invalid input(s): POCBNP CBG: Recent Labs  Lab 12/06/17 1134 12/06/17 1639 12/06/17 2232 12/07/17 0738 12/07/17 1220  GLUCAP 198* 104* 199* 157* 260*   HbA1C: Recent Labs    12/06/17 0513  HGBA1C 7.4*   Urine analysis:    Component Value Date/Time   COLORURINE YELLOW 11/28/2017 1412   APPEARANCEUR CLEAR 11/28/2017 1412   LABSPEC 1.020 11/28/2017 1412   PHURINE 5.5 11/28/2017 1412   GLUCOSEU >=500 (A) 11/28/2017 1412   HGBUR NEGATIVE 11/28/2017 1412   HGBUR negative 04/15/2008 1334   BILIRUBINUR NEGATIVE 11/28/2017 1412   KETONESUR NEGATIVE 11/28/2017 1412   PROTEINUR NEGATIVE 11/28/2017 1412   UROBILINOGEN 0.2 07/31/2013 0641   NITRITE NEGATIVE 11/28/2017 1412   LEUKOCYTESUR NEGATIVE 11/28/2017 1412   Sepsis Labs: '@LABRCNTIP' (procalcitonin:4,lacticidven:4) )No results found for this or any previous visit (from the past 240 hour(s)).   Scheduled Meds: . amLODipine   10 mg Oral Daily  . aspirin EC  81 mg Oral Daily  . atorvastatin  80 mg Oral Daily  . feeding supplement (GLUCERNA SHAKE)  237 mL Oral BID BM  . feeding supplement (PRO-STAT SUGAR FREE 64)  30 mL Oral BID  . furosemide  40 mg Oral Daily  . gabapentin  100 mg Oral TID  . insulin aspart  0-15 Units Subcutaneous TID WC  . insulin glargine  30 Units Subcutaneous q morning - 10a  . lisinopril  20 mg Oral Daily  . metoprolol tartrate  100 mg Oral BID  . polyethylene glycol  17 g Oral Daily  . timolol  1 drop Left Eye q morning - 10a   Continuous Infusions: . cefTRIAXone (ROCEPHIN)  IV Stopped (12/06/17 2236)  . heparin 1,100 Units/hr (12/07/17 1341)    Procedures/Studies: Dg Chest 2 View  Result Date: 11/28/2017 CLINICAL DATA:  Dyspnea and chest pain with nonproductive cough and wheezing x1 week. EXAM: CHEST  2 VIEW COMPARISON:  05/22/2016 FINDINGS: Stable cardiomegaly with moderate aortic atherosclerosis. Status post median sternotomy.  Diffuse interstitial prominence and peribronchial thickening consistent bronchitic change. No alveolar consolidation. Chronic posterior costophrenic angle blunting that may represent a small effusion or pleural thickening. No acute osseous abnormality. IMPRESSION: Stable cardiomegaly with aortic atherosclerosis. Peribronchial thickening with increased interstitial lung markings suspicious for bronchitic change. Electronically Signed   By: Ashley Royalty M.D.   On: 11/28/2017 20:14   Mr Foot Right Wo Contrast  Result Date: 12/06/2017 CLINICAL DATA:  Right foot ulceration dorsal along the first MTP joint. Purulent collection. Swelling. EXAM: MRI OF THE RIGHT FOREFOOT WITHOUT CONTRAST TECHNIQUE: Multiplanar, multisequence MR imaging of the right forefoot was performed. No intravenous contrast was administered. COMPARISON:  Radiographs from 12/05/2017 FINDINGS: Despite efforts by the technologist and patient, motion artifact is present on today's exam and could not be  eliminated. This reduces exam sensitivity and specificity. Bones/Joint/Cartilage There is an unusual pattern with edema signal tracking within all of the phalanges and in the heads of the metatarsals even on inversion recovery weighted images. There is some associated reduced T1 signal especially in the small digit phalanges. Ligaments Lisfranc ligament intact. Muscles and Tendons Diffuse edema tracks along the plantar musculature of the foot Soft tissues Extensive dorsal subcutaneous edema tracking along the foot and into the toes. IMPRESSION: 1. Unusual pattern with low-grade edema signal in all of the phalanges and the heads of the metatarsals. This is more striking in the small toe. There is also diffuse subcutaneous edema tracking into the toes. He would be unusual for osteomyelitis to involve all of the phalanges of the toes and metatarsal heads at once, and the appearance is more suggestive of a systemic or global phenomenon in the distal foot, possibly edema due to vascular insufficiency or reflex sympathetic dystrophy. There is currently no drainable abscess. Careful surveillance may be warranted. 2. Diffuse edema within along the plantar musculature of the foot, probably neurogenic although myositis is not readily excluded on today's noncontrast exam. I do not see any gas tracking in the soft tissues of the foot. Electronically Signed   By: Van Clines M.D.   On: 12/06/2017 18:44   Dg Foot Complete Right  Result Date: 12/05/2017 CLINICAL DATA:  Soft tissue wound right foot. EXAM: RIGHT FOOT COMPLETE - 3+ VIEW COMPARISON:  11/28/2017 FINDINGS: Mild degenerate change of the first MTP joint and midfoot. No evidence of bone destruction to suggest osteomyelitis. No air in the soft tissues. IMPRESSION: No acute findings. Electronically Signed   By: Marin Olp M.D.   On: 12/05/2017 19:19   Dg Foot Complete Right  Result Date: 11/28/2017 CLINICAL DATA:  Right foot pain and swelling. EXAM: RIGHT  FOOT COMPLETE - 3+ VIEW COMPARISON:  None. FINDINGS: There is no evidence of fracture or dislocation. No focal bone erosions. Mild hallux valgus deformity with degenerative changes at the first MTP joint. Soft tissues are unremarkable. IMPRESSION: No acute bone abnormalities. Electronically Signed   By: Kerby Moors M.D.   On: 11/28/2017 17:38    Author:  Berle Mull, MD [facility] Pager: [phone] 12/07/2017 3:47 PM     If 7PM-7AM, please contact night-coverage [email or web address] Password [email or web address] 12/07/2017, 3:45 PM   LOS: 2 days

## 2017-12-07 NOTE — Progress Notes (Signed)
[facility] for heparin Indication: ischemic right foot  Allergies  Allergen Reactions  . Bidil [Isosorb Dinitrate-Hydralazine]     PASSED OUT BUT NOT SURE IF THIS WAS THE CAUSE    Patient Measurements: Height: 5\' 6"  (167.6 cm) Weight: 181 lb 3.5 oz (82.2 kg) IBW/kg (Calculated) : 63.8 Heparin Dosing Weight: 80 kg  Vital Signs: Temp: 98.6 F (37 C) (12/23 0526) Temp Source: Oral (12/23 0526) BP: 166/71 (12/23 0526) Pulse Rate: 60 (12/23 0526)  Labs: Recent Labs    12/05/17 1830 12/06/17 0512 12/06/17 1850 12/07/17 0431 12/07/17 0521  HGB 9.9* 9.9*  --  10.2*  --   HCT 31.2* 31.7*  --  32.4*  --   PLT 270 281  --  275  --   HEPARINUNFRC  --   --  0.49  --  0.83*  CREATININE 1.19 1.12  --  0.99  --     Estimated Creatinine Clearance: 63.9 mL/min (by C-G formula based on SCr of 0.99 mg/dL).  Assessment: 76 yo male with PVD/RLE ischemia for heparin Goal of Therapy:  Heparin level 0.3-0.7 units/ml Monitor platelets by anticoagulation protocol: Yes   Plan:  Decrease Heparin 1100 units/hr Check heparin level in 8 hours.  Phillis Knack, PharmD, BCPS

## 2017-12-07 NOTE — H&P (View-Only) (Signed)
[facility]ist of [place]  Patient name: Marcus Hendrix MRN: [id number] DOB: [birth year] Sex: male   REQUESTING PROVIDER:    Dr. Carles Collet   REASON FOR CONSULT:    Right diabetic foot infection  HISTORY OF PRESENT ILLNESS:   Marcus Hendrix is a 76 y.o. male, who was transferred to [facility] with a right diabetic foot infection with superimposed rest pain.  He was in the hospital December 14 for chest pain and also right leg cellulitis.  His wound is been present for approximately 3 weeks.  He is not getting any relief of the pain.  He has not been having any fevers or chills.  He is having to hang his foot over the bed to help with pain.  Patient suffers from diabetes.  His last hemoglobin A1c was 7.6.  He is medically managed for hypertension.  He has chronic diastolic heart failure which is well compensated.  He takes a statin for hyperlipidemia.  He has a history of coronary artery disease, status post CABG.  He is currently pain-free.  PAST MEDICAL HISTORY    Past Medical History:  Diagnosis Date  . Cerebrovascular disease   . Diabetes mellitus   . Diabetes mellitus ([place])   . ED (erectile dysfunction)   . Glaucoma, left eye   . History of atherosclerotic cardiovascular disease   . History of myocardial infarction   . Hyperlipidemia   . Hypertension   . Inguinal hernia    Right  . Normocytic anemia   . Peripheral neuropathy   . PVD (peripheral vascular disease) ([place])   . Tubulovillous adenoma of colon      FAMILY HISTORY   Family History  Problem Relation Age of Onset  . Heart attack Mother   . Stroke Father   . Diabetes Sister   . HIV Brother   . Alcohol abuse Brother   . Diabetes Sister   . Heart attack Son     SOCIAL HISTORY:   Social History   Socioeconomic History  . Marital status: Married    Spouse name: Not on file  . Number of children: Not on file  . Years of  education: Not on file  . Highest education level: Not on file  Social Needs  . Financial resource strain: Not on file  . Food insecurity - worry: Not on file  . Food insecurity - inability: Not on file  . Transportation needs - medical: Not on file  . Transportation needs - non-medical: Not on file  Occupational History  . Occupation: Retired Camera operator at [employer]  . Smoking status: Former Smoker    Packs/day: 1.00    Years: 41.00    Pack years: 41.00    Types: Cigarettes    Start date: 02/06/1957    Last attempt to quit: 12/16/1997    Years since quitting: 19.9  . Smokeless tobacco: Current User    Types: Chew  . Tobacco comment: quit 12 yrs ago--10/13/15 currently a tobacco chewer for the past 25 years  Substance and Sexual Activity  . Alcohol use: No    Alcohol/week: 0.0 oz  . Drug use: No  . Sexual activity: Not on file  Other Topics Concern  . Not on file  Social History Narrative   Lives with wife Marcus Hendrix and dog Brand Males 4th grade, can read and write some    ALLERGIES:    Allergies  Allergen  Reactions  . Bidil [Isosorb Dinitrate-Hydralazine]     PASSED OUT BUT NOT SURE IF THIS WAS THE CAUSE    CURRENT MEDICATIONS:    Current Facility-Administered Medications  Medication Dose Route Frequency Provider Last Rate Last Dose  . amLODipine (NORVASC) tablet 10 mg  10 mg Oral Daily Reubin Milan, MD   10 mg at 12/07/17 5573  . aspirin EC tablet 81 mg  81 mg Oral Daily Reubin Milan, MD   81 mg at 12/07/17 0940  . atorvastatin (LIPITOR) tablet 80 mg  80 mg Oral Daily Reubin Milan, MD   80 mg at 12/07/17 2202  . cefTRIAXone (ROCEPHIN) 1 g in dextrose 5 % 50 mL IVPB  1 g Intravenous Q24H Reubin Milan, MD   Stopped at 12/06/17 2236  . feeding supplement (GLUCERNA SHAKE) (GLUCERNA SHAKE) liquid 237 mL  237 mL Oral BID BM Orson Eva, MD   237 mL at 12/07/17 1341  . feeding supplement (PRO-STAT SUGAR FREE 64) liquid 30 mL   30 mL Oral BID Tat, David, MD   30 mL at 12/07/17 0939  . furosemide (LASIX) tablet 40 mg  40 mg Oral Daily Reubin Milan, MD   40 mg at 12/07/17 5427  . gabapentin (NEURONTIN) capsule 100 mg  100 mg Oral TID Lavina Hamman, MD      . guaiFENesin-dextromethorphan [facility] DM) 100-10 MG/5ML syrup 5 mL  5 mL Oral Q4H PRN Reubin Milan, MD      . heparin ADULT infusion 100 units/mL (25000 units/229mL sodium chloride 0.45%)  1,100 Units/hr Intravenous Continuous Lavina Hamman, MD 11 mL/hr at 12/07/17 1341 1,100 Units/hr at 12/07/17 1341  . insulin aspart (novoLOG) injection 0-15 Units  0-15 Units Subcutaneous TID WC Reubin Milan, MD   8 Units at 12/07/17 1340  . insulin glargine (LANTUS) injection 30 Units  30 Units Subcutaneous q morning - 10a Orson Eva, MD   30 Units at 12/07/17 1201  . lisinopril (PRINIVIL,ZESTRIL) tablet 20 mg  20 mg Oral Daily Reubin Milan, MD   20 mg at 12/07/17 0623  . metoprolol tartrate (LOPRESSOR) tablet 100 mg  100 mg Oral BID Reubin Milan, MD   100 mg at 12/07/17 7628  . morphine 4 MG/ML injection 1 mg  1 mg Intravenous Q1H PRN Serafina Mitchell, MD   1 mg at 12/07/17 1341  . nitroGLYCERIN (NITROSTAT) SL tablet 0.4 mg  0.4 mg Sublingual Q5 min PRN Reubin Milan, MD      . oxyCODONE (Oxy IR/ROXICODONE) immediate release tablet 10 mg  10 mg Oral Q4H PRN Serafina Mitchell, MD      . polyethylene glycol (MIRALAX / GLYCOLAX) packet 17 g  17 g Oral Daily Tat, Shanon Brow, MD   17 g at 12/07/17 0939  . timolol (TIMOPTIC) 0.25 % ophthalmic solution 1 drop  1 drop Left Eye q morning - 10a Reubin Milan, MD   1 drop at 12/07/17 1203  . zolpidem (AMBIEN) tablet 5 mg  5 mg Oral QHS PRN Lavina Hamman, MD        REVIEW OF SYSTEMS:   [X]  denotes positive finding, [ ]  denotes negative finding Cardiac  Comments:  Chest pain or chest pressure:    Shortness of breath upon exertion:    Short of breath when lying flat:    Irregular heart  rhythm:        Vascular    Pain in calf,  thigh, or hip brought on by ambulation:    Pain in feet at night that wakes you up from your sleep:  x   Blood clot in your veins:    Leg swelling:         Pulmonary    Oxygen at home:    Productive cough:     Wheezing:         Neurologic    Sudden weakness in arms or legs:     Sudden numbness in arms or legs:     Sudden onset of difficulty speaking or slurred speech:    Temporary loss of vision in one eye:     Problems with dizziness:         Gastrointestinal    Blood in stool:      Vomited blood:         Genitourinary    Burning when urinating:     Blood in urine:        Psychiatric    Major depression:         Hematologic    Bleeding problems:    Problems with blood clotting too easily:        Skin    Rashes or ulcers: x       Constitutional    Fever or chills:     PHYSICAL EXAM:   Vitals:   12/06/17 1310 12/06/17 1958 12/07/17 0526 12/07/17 1500  BP: 133/67 (!) 149/60 (!) 166/71 (!) 164/59  Pulse: 60 64 60 61  Resp: 14 16 17 16   Temp: 97.8 F (36.6 C) 98 F (36.7 C) 98.6 F (37 C) 98.8 F (37.1 C)  TempSrc: Oral Oral Oral Oral  SpO2: 97% 100% 99% 97%  Weight:      Height:         GENERAL: The patient is a well-nourished male, in no acute distress. The vital signs are documented above. CARDIAC: There is a regular rate and rhythm.  VASCULAR: I do not palpate pedal pulses.  I can palpate a left femoral pulse but have trouble palpating a right femoral pulse PULMONARY: Nonlabored respirations ABDOMEN: Soft and non-tender with normal pitched bowel sounds.  MUSCULOSKELETAL: There are no major deformities or cyanosis. NEUROLOGIC: No focal weakness or paresthesias are detected. SKIN: Drainage from the fifth toe with ischemic changes.  Motor and sensory function appear to be intact.[name] PSYCHIATRIC: The patient has a normal affect.  STUDIES:   I have reviewed his foot MRI with the following findings: 1. Unusual  pattern with low-grade edema signal in all of the phalanges and the heads of the metatarsals. This is more striking in the small toe. There is also diffuse subcutaneous edema tracking into the toes. He would be unusual for osteomyelitis to involve all of the phalanges of the toes and metatarsal heads at once, and the appearance is more suggestive of a systemic or global phenomenon in the distal foot, possibly edema due to vascular insufficiency or reflex sympathetic dystrophy. There is currently no drainable abscess. Careful surveillance may be warranted. 2. Diffuse edema within along the plantar musculature of the foot, probably neurogenic although myositis is not readily excluded on today's noncontrast exam. I do not see any gas tracking in the soft tissues of the foot.  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  Diagnostic imaging will be performed followed by intervention as indicated.  The  risks and benefits of the procedure were discussed with the patient.  He understands that he will likely require some form of amputation given the appearance of his foot.  He will need to be n.p.o. after midnight.   Annamarie Major, MD [facility] of [facility] [phone] Pager [phone]

## 2017-12-07 NOTE — Consult Note (Signed)
WOC consult requested for foot wound.  MRI indicates "possibly edema due to vascular insufficiency."  Reviewed photos of blistering areas in the chart; topical treatment orders provided for staff nurse to apply xeroform gauze and kerlex until further recommendations are available from the vascualr team. ABI is pending and EMR indicates vascular team plans to assess wound and provide plan of care.  Please refer to their team for further questions. Please re-consult if further assistance is needed.  Thank-you,  Julien Girt MSN, [place], [place], [place], [place]

## 2017-12-07 NOTE — Progress Notes (Signed)
[place] for heparin Indication: ischemic right foot  Allergies  Allergen Reactions  . Bidil [Isosorb Dinitrate-Hydralazine]     PASSED OUT BUT NOT SURE IF THIS WAS THE CAUSE    Patient Measurements: Height: 5\' 6"  (167.6 cm) Weight: 181 lb 3.5 oz (82.2 kg) IBW/kg (Calculated) : 63.8 Heparin Dosing Weight: 80 kg  Vital Signs: Temp: 98.8 F (37.1 C) (12/23 1500) Temp Source: Oral (12/23 1500) BP: 164/59 (12/23 1500) Pulse Rate: 61 (12/23 1500)  Labs: Recent Labs    12/05/17 1830 12/06/17 0512 12/06/17 1850 12/07/17 0431 12/07/17 0521 12/07/17 1459  HGB 9.9* 9.9*  --  10.2*  --   --   HCT 31.2* 31.7*  --  32.4*  --   --   PLT 270 281  --  275  --   --   HEPARINUNFRC  --   --  0.49  --  0.83* 0.66  CREATININE 1.19 1.12  --  0.99  --   --     Estimated Creatinine Clearance: 63.9 mL/min (by C-G formula based on SCr of 0.99 mg/dL).   Assessment: 10 YOM to start heparin for ischemic right foot. He was not on anticoagulation PTA. Heparin level is now therapeutic after a rate reduction. No bleeding noted.     Goal of Therapy:  Heparin level 0.3-0.7 units/ml Monitor platelets by anticoagulation protocol: Yes   Plan:  Continue heparin gtt 1100 units/hr F/u AM heparin level to confirm dosing Daily heparin level and CBC  Salome Arnt, PharmD, BCPS 12/07/2017 4:10 PM

## 2017-12-07 NOTE — Consult Note (Signed)
V[facility] and Vein Specialist of [place]  Patient name: Marcus Hendrix MRN: [id number] DOB: Jan 23, 1941 Sex: male   REQUESTING PROVIDER:    Dr. Carles Collet   REASON FOR CONSULT:    Right diabetic foot infection  HISTORY OF PRESENT ILLNESS:   Marcus Hendrix is a 76 y.o. male, who was transferred to [facility] with a right diabetic foot infection with superimposed rest pain.  He was in the hospital December 14 for chest pain and also right leg cellulitis.  His wound is been present for approximately 3 weeks.  He is not getting any relief of the pain.  He has not been having any fevers or chills.  He is having to hang his foot over the bed to help with pain.  Patient suffers from diabetes.  His last hemoglobin A1c was 7.6.  He is medically managed for hypertension.  He has chronic diastolic heart failure which is well compensated.  He takes a statin for hyperlipidemia.  He has a history of coronary artery disease, status post CABG.  He is currently pain-free.  PAST MEDICAL HISTORY    Past Medical History:  Diagnosis Date  . Cerebrovascular disease   . Diabetes mellitus   . Diabetes mellitus ([place])   . ED (erectile dysfunction)   . Glaucoma, left eye   . History of atherosclerotic cardiovascular disease   . History of myocardial infarction   . Hyperlipidemia   . Hypertension   . Inguinal hernia    Right  . Normocytic anemia   . Peripheral neuropathy   . PVD (peripheral vascular disease) ([place])   . Tubulovillous adenoma of colon      FAMILY HISTORY   Family History  Problem Relation Age of Onset  . Heart attack Mother   . Stroke Father   . Diabetes Sister   . HIV Brother   . Alcohol abuse Brother   . Diabetes Sister   . Heart attack Son     SOCIAL HISTORY:   Social History   Socioeconomic History  . Marital status: Married    Spouse name: Not on file  . Number of children: Not on file  . Years of  education: Not on file  . Highest education level: Not on file  Social Needs  . Financial resource strain: Not on file  . Food insecurity - worry: Not on file  . Food insecurity - inability: Not on file  . Transportation needs - medical: Not on file  . Transportation needs - non-medical: Not on file  Occupational History  . Occupation: Retired Camera operator at [employer]  . Smoking status: Former Smoker    Packs/day: 1.00    Years: 41.00    Pack years: 41.00    Types: Cigarettes    Start date: 02/06/1957    Last attempt to quit: 12/16/1997    Years since quitting: 19.9  . Smokeless tobacco: Current User    Types: Chew  . Tobacco comment: quit 12 yrs ago--10/13/15 currently a tobacco chewer for the past 25 years  Substance and Sexual Activity  . Alcohol use: No    Alcohol/week: 0.0 oz  . Drug use: No  . Sexual activity: Not on file  Other Topics Concern  . Not on file  Social History Narrative   Lives with wife Rudene Christians and dog Brand Males 4th grade, can read and write some    ALLERGIES:    Allergies  Allergen  Reactions  . Bidil [Isosorb Dinitrate-Hydralazine]     PASSED OUT BUT NOT SURE IF THIS WAS THE CAUSE    CURRENT MEDICATIONS:    Current Facility-Administered Medications  Medication Dose Route Frequency Provider Last Rate Last Dose  . amLODipine (NORVASC) tablet 10 mg  10 mg Oral Daily Reubin Milan, MD   10 mg at 12/07/17 0092  . aspirin EC tablet 81 mg  81 mg Oral Daily Reubin Milan, MD   81 mg at 12/07/17 0940  . atorvastatin (LIPITOR) tablet 80 mg  80 mg Oral Daily Reubin Milan, MD   80 mg at 12/07/17 3300  . cefTRIAXone (ROCEPHIN) 1 g in dextrose 5 % 50 mL IVPB  1 g Intravenous Q24H Reubin Milan, MD   Stopped at 12/06/17 2236  . feeding supplement (GLUCERNA SHAKE) (GLUCERNA SHAKE) liquid 237 mL  237 mL Oral BID BM Orson Eva, MD   237 mL at 12/07/17 1341  . feeding supplement (PRO-STAT SUGAR FREE 64) liquid 30 mL   30 mL Oral BID Tat, David, MD   30 mL at 12/07/17 0939  . furosemide (LASIX) tablet 40 mg  40 mg Oral Daily Reubin Milan, MD   40 mg at 12/07/17 7622  . gabapentin (NEURONTIN) capsule 100 mg  100 mg Oral TID Lavina Hamman, MD      . guaiFENesin-dextromethorphan [facility] DM) 100-10 MG/5ML syrup 5 mL  5 mL Oral Q4H PRN Reubin Milan, MD      . heparin ADULT infusion 100 units/mL (25000 units/234mL sodium chloride 0.45%)  1,100 Units/hr Intravenous Continuous Lavina Hamman, MD 11 mL/hr at 12/07/17 1341 1,100 Units/hr at 12/07/17 1341  . insulin aspart (novoLOG) injection 0-15 Units  0-15 Units Subcutaneous TID WC Reubin Milan, MD   8 Units at 12/07/17 1340  . insulin glargine (LANTUS) injection 30 Units  30 Units Subcutaneous q morning - 10a Orson Eva, MD   30 Units at 12/07/17 1201  . lisinopril (PRINIVIL,ZESTRIL) tablet 20 mg  20 mg Oral Daily Reubin Milan, MD   20 mg at 12/07/17 6333  . metoprolol tartrate (LOPRESSOR) tablet 100 mg  100 mg Oral BID Reubin Milan, MD   100 mg at 12/07/17 5456  . morphine 4 MG/ML injection 1 mg  1 mg Intravenous Q1H PRN Serafina Mitchell, MD   1 mg at 12/07/17 1341  . nitroGLYCERIN (NITROSTAT) SL tablet 0.4 mg  0.4 mg Sublingual Q5 min PRN Reubin Milan, MD      . oxyCODONE (Oxy IR/ROXICODONE) immediate release tablet 10 mg  10 mg Oral Q4H PRN Serafina Mitchell, MD      . polyethylene glycol (MIRALAX / GLYCOLAX) packet 17 g  17 g Oral Daily Tat, Shanon Brow, MD   17 g at 12/07/17 0939  . timolol (TIMOPTIC) 0.25 % ophthalmic solution 1 drop  1 drop Left Eye q morning - 10a Reubin Milan, MD   1 drop at 12/07/17 1203  . zolpidem (AMBIEN) tablet 5 mg  5 mg Oral QHS PRN Lavina Hamman, MD        REVIEW OF SYSTEMS:   [X]  denotes positive finding, [ ]  denotes negative finding Cardiac  Comments:  Chest pain or chest pressure:    Shortness of breath upon exertion:    Short of breath when lying flat:    Irregular heart  rhythm:        Vascular    Pain in calf,  thigh, or hip brought on by ambulation:    Pain in feet at night that wakes you up from your sleep:  x   Blood clot in your veins:    Leg swelling:         Pulmonary    Oxygen at home:    Productive cough:     Wheezing:         Neurologic    Sudden weakness in arms or legs:     Sudden numbness in arms or legs:     Sudden onset of difficulty speaking or slurred speech:    Temporary loss of vision in one eye:     Problems with dizziness:         Gastrointestinal    Blood in stool:      Vomited blood:         Genitourinary    Burning when urinating:     Blood in urine:        Psychiatric    Major depression:         Hematologic    Bleeding problems:    Problems with blood clotting too easily:        Skin    Rashes or ulcers: x       Constitutional    Fever or chills:     PHYSICAL EXAM:   Vitals:   12/06/17 1310 12/06/17 1958 12/07/17 0526 12/07/17 1500  BP: 133/67 (!) 149/60 (!) 166/71 (!) 164/59  Pulse: 60 64 60 61  Resp: 14 16 17 16   Temp: 97.8 F (36.6 C) 98 F (36.7 C) 98.6 F (37 C) 98.8 F (37.1 C)  TempSrc: Oral Oral Oral Oral  SpO2: 97% 100% 99% 97%  Weight:      Height:         GENERAL: The patient is a well-nourished male, in no acute distress. The vital signs are documented above. CARDIAC: There is a regular rate and rhythm.  VASCULAR: I do not palpate pedal pulses.  I can palpate a left femoral pulse but have trouble palpating a right femoral pulse PULMONARY: Nonlabored respirations ABDOMEN: Soft and non-tender with normal pitched bowel sounds.  MUSCULOSKELETAL: There are no major deformities or cyanosis. NEUROLOGIC: No focal weakness or paresthesias are detected. SKIN: Drainage from the fifth toe with ischemic changes.  Motor and sensory function appear to be intact.[name] PSYCHIATRIC: The patient has a normal affect.  STUDIES:   I have reviewed his foot MRI with the following findings: 1. Unusual  pattern with low-grade edema signal in all of the phalanges and the heads of the metatarsals. This is more striking in the small toe. There is also diffuse subcutaneous edema tracking into the toes. He would be unusual for osteomyelitis to involve all of the phalanges of the toes and metatarsal heads at once, and the appearance is more suggestive of a systemic or global phenomenon in the distal foot, possibly edema due to vascular insufficiency or reflex sympathetic dystrophy. There is currently no drainable abscess. Careful surveillance may be warranted. 2. Diffuse edema within along the plantar musculature of the foot, probably neurogenic although myositis is not readily excluded on today's noncontrast exam. I do not see any gas tracking in the soft tissues of the foot.  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  Diagnostic imaging will be performed followed by intervention as indicated.  The  risks and benefits of the procedure were discussed with the patient.  He understands that he will likely require some form of amputation given the appearance of his foot.  He will need to be n.p.o. after midnight.   Annamarie Major, MD [facility] of [facility] [phone] Pager [phone]

## 2017-12-08 ENCOUNTER — Encounter (HOSPITAL_COMMUNITY)
Admission: EM | Disposition: A | Discharge status: Home Health | Payer: Self-pay | Source: Home / Self Care | Attending: Internal Medicine

## 2017-12-08 ENCOUNTER — Inpatient Hospital Stay (HOSPITAL_COMMUNITY): Payer: Medicare Other

## 2017-12-08 DIAGNOSIS — E11621 Type 2 diabetes mellitus with foot ulcer: Secondary | ICD-10-CM

## 2017-12-08 HISTORY — PX: LOWER EXTREMITY ANGIOGRAPHY: CATH118251

## 2017-12-08 HISTORY — PX: PERIPHERAL VASCULAR INTERVENTION: CATH118257

## 2017-12-08 LAB — BASIC METABOLIC PANEL
ANION GAP: 8 (ref 5–15)
BUN: 13 mg/dL (ref 6–20)
CALCIUM: 8.5 mg/dL — AB (ref 8.9–10.3)
CO2: 24 mmol/L (ref 22–32)
CREATININE: 0.99 mg/dL (ref 0.61–1.24)
Chloride: 102 mmol/L (ref 101–111)
GFR calc Af Amer: >60 mL/min (ref >60)
GLUCOSE: 95 mg/dL (ref 65–99)
Potassium: 4.1 mmol/L (ref 3.5–5.1)
Sodium: 134 mmol/L — ABNORMAL LOW (ref 135–145)

## 2017-12-08 LAB — POCT ACTIVATED CLOTTING TIME
Activated Clotting Time: 263 seconds
Activated Clotting Time: 224 seconds
Activated Clotting Time: 180 seconds

## 2017-12-08 LAB — CBC
HEMATOCRIT: 32.3 % — AB (ref 39.0–52.0)
HEMOGLOBIN: 10.1 g/dL — AB (ref 13.0–17.0)
MCH: 29.8 pg (ref 26.0–34.0)
MCHC: 31.3 g/dL (ref 30.0–36.0)
MCV: 95.3 fL (ref 78.0–100.0)
Platelets: 264 10*3/uL (ref 150–400)
RBC: 3.39 MIL/uL — ABNORMAL LOW (ref 4.22–5.81)
RDW: 13.3 % (ref 11.5–15.5)
WBC: 8.6 10*3/uL (ref 4.0–10.5)

## 2017-12-08 LAB — CBC WITH DIFFERENTIAL/PLATELET
BASOS ABS: 0 10*3/uL (ref 0.0–0.1)
BASOS PCT: 0 %
EOS PCT: 4 %
Eosinophils Absolute: 0.3 10*3/uL (ref 0.0–0.7)
HCT: 31.4 % — ABNORMAL LOW (ref 39.0–52.0)
Hemoglobin: 10 g/dL — ABNORMAL LOW (ref 13.0–17.0)
Lymphocytes Relative: 29 %
Lymphs Abs: 2.4 10*3/uL (ref 0.7–4.0)
MCH: 30 pg (ref 26.0–34.0)
MCHC: 31.8 g/dL (ref 30.0–36.0)
MCV: 94.3 fL (ref 78.0–100.0)
MONO ABS: 0.7 10*3/uL (ref 0.1–1.0)
Monocytes Relative: 8 %
Neutro Abs: 4.9 10*3/uL (ref 1.7–7.7)
Neutrophils Relative %: 59 %
PLATELETS: 275 10*3/uL (ref 150–400)
RBC: 3.33 MIL/uL — ABNORMAL LOW (ref 4.22–5.81)
RDW: 13.3 % (ref 11.5–15.5)
WBC: 8.3 10*3/uL (ref 4.0–10.5)

## 2017-12-08 LAB — GLUCOSE, CAPILLARY
GLUCOSE-CAPILLARY: 103 mg/dL — AB (ref 65–99)
GLUCOSE-CAPILLARY: 87 mg/dL (ref 65–99)
GLUCOSE-CAPILLARY: 163 mg/dL — AB (ref 65–99)
Glucose-Capillary: 89 mg/dL (ref 65–99)
Glucose-Capillary: 153 mg/dL — ABNORMAL HIGH (ref 65–99)

## 2017-12-08 LAB — CREATININE, SERUM
Creatinine, Ser: 1.1 mg/dL (ref 0.61–1.24)
GFR calc Af Amer: >60 mL/min (ref >60)

## 2017-12-08 LAB — MAGNESIUM: Magnesium: 2 mg/dL (ref 1.7–2.4)

## 2017-12-08 LAB — HEPARIN LEVEL (UNFRACTIONATED): Heparin Unfractionated: 0.41 IU/mL (ref 0.30–0.70)

## 2017-12-08 LAB — PROTIME-INR
INR: 1.21
PROTHROMBIN TIME: 15.2 s (ref 11.4–15.2)

## 2017-12-08 SURGERY — LOWER EXTREMITY ANGIOGRAPHY
Anesthesia: LOCAL

## 2017-12-08 MED ORDER — HEPARIN (PORCINE) IN NACL 2-0.9 UNIT/ML-% IJ SOLN
INTRAMUSCULAR | Status: AC
  Filled 2017-12-08: qty 1000

## 2017-12-08 MED ORDER — SODIUM CHLORIDE 0.9 % IV SOLN: INTRAVENOUS | Status: AC

## 2017-12-08 MED ORDER — LABETALOL HCL 5 MG/ML IV SOLN
INTRAVENOUS | Status: AC
  Filled 2017-12-08: qty 4

## 2017-12-08 MED ORDER — CLONIDINE HCL 0.1 MG PO TABS
0.1000 mg | ORAL_TABLET | ORAL | Status: DC | PRN
  Administered 2017-12-08: 0.1 mg via ORAL
  Filled 2017-12-08 (×2): qty 1

## 2017-12-08 MED ORDER — IODIXANOL 320 MG/ML IV SOLN
INTRAVENOUS | Status: DC | PRN
  Administered 2017-12-08: 287 mL via INTRA_ARTERIAL

## 2017-12-08 MED ORDER — SODIUM CHLORIDE 0.9 % IV SOLN: 250.0000 mL | INTRAVENOUS | Status: DC | PRN

## 2017-12-08 MED ORDER — MORPHINE SULFATE (PF) 4 MG/ML IV SOLN
INTRAVENOUS | Status: AC
  Filled 2017-12-08: qty 1

## 2017-12-08 MED ORDER — LABETALOL HCL 5 MG/ML IV SOLN
10.0000 mg | INTRAVENOUS | Status: DC | PRN
  Administered 2017-12-08: 10 mg via INTRAVENOUS
  Filled 2017-12-08: qty 4

## 2017-12-08 MED ORDER — SODIUM CHLORIDE 0.9% FLUSH
3.0000 mL | Freq: Two times a day (BID) | INTRAVENOUS | Status: DC
  Administered 2017-12-08 – 2017-12-16 (×13): 3 mL via INTRAVENOUS

## 2017-12-08 MED ORDER — LIDOCAINE HCL (PF) 1 % IJ SOLN
INTRAMUSCULAR | Status: DC | PRN
  Administered 2017-12-08: 25 mL

## 2017-12-08 MED ORDER — HEPARIN SODIUM (PORCINE) 1000 UNIT/ML IJ SOLN
INTRAMUSCULAR | Status: AC
  Filled 2017-12-08: qty 1

## 2017-12-08 MED ORDER — HEPARIN SODIUM (PORCINE) 1000 UNIT/ML IJ SOLN
INTRAMUSCULAR | Status: DC | PRN
  Administered 2017-12-08: 7000 [IU] via INTRAVENOUS

## 2017-12-08 MED ORDER — FENTANYL CITRATE (PF) 100 MCG/2ML IJ SOLN
INTRAMUSCULAR | Status: DC | PRN
  Administered 2017-12-08 (×4): 25 ug via INTRAVENOUS

## 2017-12-08 MED ORDER — SODIUM CHLORIDE 0.9% FLUSH: 3.0000 mL | INTRAVENOUS | Status: DC | PRN

## 2017-12-08 MED ORDER — MORPHINE SULFATE (PF) 10 MG/ML IV SOLN
4.0000 mg | Freq: Once | INTRAVENOUS | Status: AC
  Administered 2017-12-08: 4 mg via INTRAVENOUS

## 2017-12-08 MED ORDER — MORPHINE SULFATE (PF) 4 MG/ML IV SOLN
4.0000 mg | Freq: Once | INTRAVENOUS | Status: AC
  Administered 2017-12-08: 4 mg via INTRAVENOUS

## 2017-12-08 MED ORDER — HEPARIN SODIUM (PORCINE) 5000 UNIT/ML IJ SOLN
5000.0000 [IU] | Freq: Three times a day (TID) | INTRAMUSCULAR | Status: DC
  Administered 2017-12-08 – 2017-12-12 (×9): 5000 [IU] via SUBCUTANEOUS
  Filled 2017-12-08 (×10): qty 1

## 2017-12-08 MED ORDER — CLONIDINE HCL 0.1 MG PO TABS
0.1000 mg | ORAL_TABLET | ORAL | Status: DC
  Filled 2017-12-08 (×6): qty 1

## 2017-12-08 MED ORDER — LIDOCAINE HCL (PF) 1 % IJ SOLN
INTRAMUSCULAR | Status: AC
  Filled 2017-12-08: qty 30

## 2017-12-08 MED ORDER — FENTANYL CITRATE (PF) 100 MCG/2ML IJ SOLN
INTRAMUSCULAR | Status: AC
  Filled 2017-12-08: qty 2

## 2017-12-08 MED ORDER — HEPARIN (PORCINE) IN NACL 2-0.9 UNIT/ML-% IJ SOLN
INTRAMUSCULAR | Status: AC | PRN
  Administered 2017-12-08: 1000 mL via INTRA_ARTERIAL

## 2017-12-08 SURGICAL SUPPLY — 25 items
BALLN MUSTANG 6.0X40 75 (BALLOONS) ×3
BALLOON MUSTANG 6.0X40 75 (BALLOONS) IMPLANT
CATH ANGIO 5F BER2 65CM (CATHETERS) ×1 IMPLANT
CATH ANGIO 5F PIGTAIL 65CM (CATHETERS) ×1 IMPLANT
CATH SOFT-VU 4F 65 STRAIGHT (CATHETERS) IMPLANT
CATH SOFT-VU STRAIGHT 4F 65CM (CATHETERS) ×3
COVER PRB 48X5XTLSCP FOLD TPE (BAG) IMPLANT
COVER PROBE 5X48 (BAG) ×6
DEVICE CONTINUOUS FLUSH (MISCELLANEOUS) ×1 IMPLANT
DEVICE TORQUE .025-.038 (MISCELLANEOUS) ×1 IMPLANT
GUIDEWIRE ANGLED .035X150CM (WIRE) ×1 IMPLANT
KIT ENCORE 26 ADVANTAGE (KITS) ×2 IMPLANT
KIT MICROINTRODUCER STIFF 5F (SHEATH) ×1 IMPLANT
KIT PV (KITS) ×3 IMPLANT
SHEATH BRITE TIP 7FR 35CM (SHEATH) ×1 IMPLANT
SHEATH PINNACLE 5F 10CM (SHEATH) ×1 IMPLANT
SHEATH PINNACLE 7F 10CM (SHEATH) ×2 IMPLANT
STENT INNOVA 6X40X130 (Permanent Stent) ×1 IMPLANT
STENT VIABAHN 7X39X80 VBX (Permanent Stent) ×2 IMPLANT
SYR MEDRAD MARK V 150ML (SYRINGE) ×3 IMPLANT
TRANSDUCER W/STOPCOCK (MISCELLANEOUS) ×3 IMPLANT
TRAY PV CATH (CUSTOM PROCEDURE TRAY) ×3 IMPLANT
WIRE AMPLATZ SS-J .035X180CM (WIRE) ×1 IMPLANT
WIRE BENTSON .035X145CM (WIRE) ×2 IMPLANT
WIRE ROSEN-J .035X260CM (WIRE) ×1 IMPLANT

## 2017-12-08 NOTE — Progress Notes (Addendum)
0815 Pt kept NPO post mn for LE arteriogram, LE angiography. Pt to vascular via stretcher.  Pt will be transferred to 4E post procedure. Pt's belongings was brought to 4E desk.

## 2017-12-08 NOTE — Plan of Care (Signed)
  Coping: Level of anxiety will decrease 12/08/2017 2250 - Progressing by Lesle Reek, RN   Safety: Ability to remain free from injury will improve 12/08/2017 2250 - Progressing by Lesle Reek, RN

## 2017-12-08 NOTE — Interval H&P Note (Signed)
History and Physical Interval Note:  12/08/2017 8:42 AM  Marcus Hendrix  has presented today for surgery, with the diagnosis of claudication  The various methods of treatment have been discussed with the patient and family. After consideration of risks, benefits and other options for treatment, the patient has consented to  Procedure(s): LOWER EXTREMITY ANGIOGRAPHY ([name]) as a surgical intervention .  The patient's history has been reviewed, patient examined, no change in status, stable for surgery.  I have reviewed the patient's chart and labs.  Questions were answered to the patient's satisfaction.     [name]

## 2017-12-08 NOTE — Progress Notes (Signed)
PROGRESS NOTE  Marcus Hendrix PET:[id number] DOB: December 04, 1941 DOA: 12/05/2017 PCP: [name], MD  Brief History:  76 year old male with a history of diabetes mellitus, coronary artery disease, hyperlipidemia, hypertension, peripheral vascular disease, presenting with approximately 3-week history of right foo swelling, and drainage.  Patient does not recall any recent injury.  The patient was actually admitted to the hospital from November 28, 2017 through November 29, 2017 for chest pain during which time the patient was also treated for cellulitis of the right lower extremity.  He was discharged home with Augmentin and endorsed compliance with the medication.  He went to see his primary care provider on December 05, 2017 who noted increasing pain, edema and erythema.  As result, the patient was sent to the emergency department for further evaluation.  He states that right foot pain has been his major complaint for the past week.  He denies any fevers, chills, chest pain, shortness breath, coughing, hemoptysis, nausea, vomiting, diarrhea.  At the time of admission, WBC was 7.9, and the patient was afebrile hemodynamically stable.  Upon evaluation on the morning of December 06, 2017, the patient's right foot was noted to be cool to the touch.  In addition, the patient stated that for the past week he has had 2 dangle his right lower extremity to gravity to get relief of his pain.  He has also noted some claudication type symptoms.  Assessment/Plan: Ischemia to the right foot -Agree that patient's symptoms show more PVD as a primary cause of presentation with possible critical limb ischemia. -on IV heparin after discussion with Dr. Trula Slade -Pain control with Percocet and gabapentin -Underwent aortogram on 12/08/2017, shows bilateral common iliac stenosis, right external iliac stenosis with severe right tibial, SFA, popliteal disease. Further plan will be decided by vascular surgery  regarding revascularization versus amputation of the right foot.  Diabetic foot ulcer -Please see pictures below -will not start antibiotics--afebrile, hemodynamically stable, no leukocytosis -wound care consult -MRI foot negative for any osteomyelitis, shows edema and insufficiency due to poor vasculature. 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 Family Communication:  no Family at bedside  Consultants:    Code Status:  FULL / DNR  DVT Prophylaxis:  IV Heparin   Procedures: As Listed in Progress Note Above  Antibiotics: Ceftriaxone x 1-- 12/21    Subjective: Seen postprocedure, pain well controlled.  No nausea no vomiting.  Objective: Vitals:   12/08/17 1430 12/08/17 1431 12/08/17 1436 12/08/17 1441  BP: (!) 124/29     Pulse:  (!) 0 (!) 0 (!) 0  Resp: 19 (!) 0 (!) 0 (!) 0  Temp:      TempSrc:      SpO2: 95%     Weight:      Height:        Intake/Output Summary (Last 24 hours) at 12/08/2017 1452 Last data filed at 12/08/2017 0714 Gross per 24 hour  Intake 500.7 ml  Output 177 ml  Net 323.7 ml   Weight change:  Exam:   General:  Pt is alert, follows commands appropriately, not in acute distress  HEENT: No icterus, No thrush, No neck mass, [place]/AT  Cardiovascular: RRR, S1/S2, no rubs, no gallops  Respiratory: CTA bilaterally, no wheezing, no crackles, no rhonchi  Abdomen: Soft/+BS, non tender, non distended, no guarding  Extremities: 2+LE edema, No  lymphangitis, No petechiae, No rashes, no synovitis; see pictures below or right foot       Data Reviewed: I have personally reviewed following labs and imaging studies Basic Metabolic Panel: Recent Labs  Lab 12/05/17 1830 12/06/17 0512 12/07/17 0431 12/08/17 0706  NA 134* 136 133* 134*  K 4.8 4.6 4.3 4.1  CL 102 101 102 102  CO2 20* '24 23 24  ' GLUCOSE 310* 160* 156* 95  BUN '18 18 14 13  ' CREATININE 1.19 1.12 0.99 0.99  CALCIUM 8.7* 8.7* 8.5* 8.5*  MG  --   --   --  2.0   Liver Function Tests: Recent Labs  Lab 12/05/17 1830  AST 41  ALT 52  ALKPHOS 126  BILITOT 1.2  PROT 7.2  ALBUMIN 3.5   No results for input(s): LIPASE, AMYLASE in the last 168 hours. No results for input(s): AMMONIA in the last 168 hours. Coagulation Profile: Recent Labs  Lab 12/08/17 0706  INR 1.21   CBC: Recent Labs  Lab 12/05/17 1830 12/06/17 0512 12/07/17 0431 12/08/17 0706  WBC 7.9 7.0 7.8 8.3  NEUTROABS 5.3 4.5 4.9 4.9  HGB 9.9* 9.9* 10.2* 10.0*  HCT 31.2* 31.7* 32.4* 31.4*  MCV 96.0 96.9 93.9 94.3  PLT 270 281 275 275   Cardiac Enzymes: No results for input(s): CKTOTAL, CKMB, CKMBINDEX, TROPONINI in the last 168 hours. BNP: Invalid input(s): POCBNP CBG: Recent Labs  Lab 12/07/17 1710 12/07/17 2210 12/08/17 0742 12/08/17 1154 12/08/17 1422  GLUCAP 112* 150* 89 103* 87   HbA1C: Recent Labs    12/06/17 0513  HGBA1C 7.4*   Urine analysis:    Component Value Date/Time   COLORURINE YELLOW 11/28/2017 1412   APPEARANCEUR CLEAR 11/28/2017 1412   LABSPEC 1.020 11/28/2017 1412   PHURINE 5.5 11/28/2017 1412   GLUCOSEU >=500 (A) 11/28/2017 1412   HGBUR NEGATIVE 11/28/2017 1412   HGBUR negative 04/15/2008 1334   BILIRUBINUR NEGATIVE 11/28/2017 1412   KETONESUR NEGATIVE 11/28/2017 1412   PROTEINUR NEGATIVE 11/28/2017 1412   UROBILINOGEN 0.2 07/31/2013 0641   NITRITE NEGATIVE 11/28/2017 1412   LEUKOCYTESUR NEGATIVE 11/28/2017 1412   Sepsis  Labs: '@LABRCNTIP' (procalcitonin:4,lacticidven:4) ) Recent Results (from the past 240 hour(s))  MRSA PCR Screening     Status: None   Collection Time: 12/07/17  4:33 PM  Result Value Ref Range Status   MRSA by PCR NEGATIVE NEGATIVE Final    Comment:        The GeneXpert MRSA Assay (FDA approved for NASAL specimens only), is one component of a comprehensive MRSA colonization surveillance program. It is not intended to diagnose MRSA infection nor to guide or monitor treatment for MRSA infections.      Scheduled Meds: . amLODipine  10 mg Oral Daily  . aspirin EC  81 mg Oral Daily  . atorvastatin  80 mg Oral Daily  . feeding supplement (GLUCERNA SHAKE)  237 mL Oral BID BM  . feeding supplement (PRO-STAT SUGAR FREE 64)  30 mL Oral BID  . furosemide  40 mg Oral Daily  . gabapentin  100 mg Oral TID  . insulin aspart  0-15 Units Subcutaneous TID WC  .  insulin glargine  30 Units Subcutaneous q morning - 10a  . lisinopril  20 mg Oral Daily  . metoprolol tartrate  100 mg Oral BID  . polyethylene glycol  17 g Oral Daily  . timolol  1 drop Left Eye q morning - 10a   Continuous Infusions: . sodium chloride 375 mL (12/08/17 1238)  . cefTRIAXone (ROCEPHIN)  IV Stopped (12/07/17 2313)  . heparin 1,100 Units/hr (12/07/17 1341)    Procedures/Studies: Dg Chest 2 View  Result Date: 11/28/2017 CLINICAL DATA:  Dyspnea and chest pain with nonproductive cough and wheezing x1 week. EXAM: CHEST  2 VIEW COMPARISON:  05/22/2016 FINDINGS: Stable cardiomegaly with moderate aortic atherosclerosis. Status post median sternotomy. Diffuse interstitial prominence and peribronchial thickening consistent bronchitic change. No alveolar consolidation. Chronic posterior costophrenic angle blunting that may represent a small effusion or pleural thickening. No acute osseous abnormality. IMPRESSION: Stable cardiomegaly with aortic atherosclerosis. Peribronchial thickening with increased interstitial lung markings  suspicious for bronchitic change. Electronically Signed   By: Ashley Royalty M.D.   On: 11/28/2017 20:14   Mr Foot Right Wo Contrast  Result Date: 12/06/2017 CLINICAL DATA:  Right foot ulceration dorsal along the first MTP joint. Purulent collection. Swelling. EXAM: MRI OF THE RIGHT FOREFOOT WITHOUT CONTRAST TECHNIQUE: Multiplanar, multisequence MR imaging of the right forefoot was performed. No intravenous contrast was administered. COMPARISON:  Radiographs from 12/05/2017 FINDINGS: Despite efforts by the technologist and patient, motion artifact is present on today's exam and could not be eliminated. This reduces exam sensitivity and specificity. Bones/Joint/Cartilage There is an unusual pattern with edema signal tracking within all of the phalanges and in the heads of the metatarsals even on inversion recovery weighted images. There is some associated reduced T1 signal especially in the small digit phalanges. Ligaments Lisfranc ligament intact. Muscles and Tendons Diffuse edema tracks along the plantar musculature of the foot Soft tissues Extensive dorsal subcutaneous edema tracking along the foot and into the toes. IMPRESSION: 1. Unusual pattern with low-grade edema signal in all of the phalanges and the heads of the metatarsals. This is more striking in the small toe. There is also diffuse subcutaneous edema tracking into the toes. He would be unusual for osteomyelitis to involve all of the phalanges of the toes and metatarsal heads at once, and the appearance is more suggestive of a systemic or global phenomenon in the distal foot, possibly edema due to vascular insufficiency or reflex sympathetic dystrophy. There is currently no drainable abscess. Careful surveillance may be warranted. 2. Diffuse edema within along the plantar musculature of the foot, probably neurogenic although myositis is not readily excluded on today's noncontrast exam. I do not see any gas tracking in the soft tissues of the foot.  Electronically Signed   By: Van Clines M.D.   On: 12/06/2017 18:44   Dg Foot Complete Right  Result Date: 12/05/2017 CLINICAL DATA:  Soft tissue wound right foot. EXAM: RIGHT FOOT COMPLETE - 3+ VIEW COMPARISON:  11/28/2017 FINDINGS: Mild degenerate change of the first MTP joint and midfoot. No evidence of bone destruction to suggest osteomyelitis. No air in the soft tissues. IMPRESSION: No acute findings. Electronically Signed   By: Marin Olp M.D.   On: 12/05/2017 19:19   Dg Foot Complete Right  Result Date: 11/28/2017 CLINICAL DATA:  Right foot pain and swelling. EXAM: RIGHT FOOT COMPLETE - 3+ VIEW COMPARISON:  None. FINDINGS: There is no evidence of fracture or dislocation. No focal bone erosions. Mild hallux valgus deformity with degenerative changes  at the first MTP joint. Soft tissues are unremarkable. IMPRESSION: No acute bone abnormalities. Electronically Signed   By: Kerby Moors M.D.   On: 11/28/2017 17:38    Author:  Berle Mull, MD [facility] Pager: [phone] 12/08/2017 2:52 PM     If 7PM-7AM, please contact night-coverage [email or web address] Password [email or web address] 12/08/2017, 2:52 PM   LOS: 3 days

## 2017-12-08 NOTE — Progress Notes (Signed)
ABI's have been completed. Results given to the patient's nurse, Jeneen Rinks.  12/08/17 4:38 PM Carlos Levering RVT

## 2017-12-08 NOTE — Progress Notes (Signed)
Site area: RFA / LFA Site Prior to Removal:  Level  Pressure Applied For:25 min each Manual:   yes Patient Status During Pull: stable  Post Pull Site:  Level 0 / 0 Post Pull Instructions Given:  yes Post Pull Pulses Present:  Dressing Applied:  tegadwerm Bedrest begins @ 1430 till 2030 Comments:

## 2017-12-08 NOTE — Op Note (Signed)
Procedure: Abdominal aortogram with bilateral lower extremity runoff (right and left common iliac stent 7 x 39 VBX, right external iliac stent 6 x 40 self-expanding)  Preoperative diagnosis: Nonhealing wound right foot  Postoperative diagnosis: Same  Anesthesia: Local with IV sedation  Operative findings: #1 left common iliac stenosis 70% stented to 0 residual stenosis 7 x 39 VBX  2.  Right common iliac 90% stented to 0% residual stenosis 7 x 39 VBX  3.  Right external iliac artery stenosis 90% stented with 6 x 40 self-expanding to 0% residual stenosis  4.  Severe right lower extremity tibial occlusive disease and superficial femoral popliteal occlusive disease.  No named runoff vessel to the right foot  5.  Left superficial femoral artery occlusion with reconstitution of the above-knee popliteal artery one-vessel peroneal runoff to the left foot  Operative details: After obtaining informed consent, the patient was brought to the fibula.  The patient was placed in supine position on the Angio table.  Both groins were prepped and draped in usual sterile fashion.  Local anesthesia was then treated over the left common femoral artery.  An introducer needle was used to cannulate the left common femoral artery under ultrasound guidance.  An 035 her Scouras was threaded up into the midportion of the left common iliac artery before it met resistance.  A 5 French sheath over the guidewire into the left common femoral artery and this was thoroughly flushed with saline.  Using an Hayden catheter for support I was able to advance the wire slightly but still not into the abdominal aorta.  Versacore wire was removed and exchanged for an 035 angled Glidewire.  With some manipulation I was able to advance this into the abdominal aorta.  A 5 French pig tail catheter was then placed over this.  Abdominal aortogram was then obtained in AP projection.  The left and right renal arteries are patent.  The left  renal artery has an 80% origin stenosis.  The infrarenal abdominal aorta is patent.  In the left common iliac artery but there is a 70% mid stenosis.  The left external iliac artery is patent.  The left internal iliac artery is small but patent.  In the right lower extremity the right iliac artery has a 90% stenosis in its midportion.  There is also an additional 90% stenosis of the distal right external iliac artery.  The right internal iliac artery is occluded.  Next the PICU catheter was pulled down just above the aortic bifurcation and bilateral oblique views of the pelvis were obtained to confirm the above findings.  At this point bilateral lower extremity runoff views were obtained through the pectoral catheter.  The right leg was not well visualized due to the multiple iliac stenoses.  In the left lower extremity, the left common femoral profundus patent.  The left superficial femoral artery occludes at its origin.  The above-knee popliteal artery reconstitutes via collaterals and gives off one peroneal runoff vessel to the left foot.  The anterior tibial and posterior tibial arteries are occluded.  In the right lower extremity, the right common femoral artery has a mid stenosis approaching 70%.  The right superficial femoral artery occludes at its origin.  The right profunda femoris is small and gives off a few collaterals.  The right popliteal artery is occluded except for a small island of the below-knee popliteal artery.  The anterior tibial and posterior tibial and peroneal arteries are all occluded at their origin.  There is a wisp of the distal peroneal which opacifies briefly just above the ankle.  At this point we decided to intervene on the iliac lesions.  The left 5 French sheath was exchanged over a guidewire for a 7 [place] Brite-tip sheath.  The patient was given 7007 intravenous heparin.  A right groin puncture was performed under ultrasound using a micropuncture sheath and this was  advanced up into the mid right external iliac artery.  Micropuncture sheath was placed over this.  I was then able to advance an 035 angled Glidewire across the lesion on the right side and up into the abdominal aorta.  A Berenstein catheter was used to exchange this wire for an 035 Amplatz wire at this point a 7 [place] Brite-tip sheath was placed in the right groin into the proximal right external iliac artery but I could not advance this easily across the external iliac stenosis.  Using roadmapping techniques I then proceeded to place a 7 x 39 VBX stent standing from the proximal to mid common iliac bilaterally.  These were both inflated to nominal pressure at 12 atm for 1 minute.  Completion angiogram showed wide patency with no evidence of dissection.  I then proceeded to stent the right external iliac lesion using a 6 x 40 self-expanding stent.  This was then postdilated with a 6 mm balloon to nominal pressure for 1 minute.  I then attempted one last time to obtain better runoff views.  However although the runoff images were improved from prior to stenting the patient still has very poor outflow to the right foot and is at high risk for limb loss in the right leg.  At this point a big toe catheter was removed over guidewire.  The guidewires were all removed and the sheath is pulled down into the pelvis bilaterally.  These were both thoroughly flushed with heparinized saline.  She is for left in place to be pulled after the ACT is less than 175.  Operative management: Dr. Trula Slade will review the patient's images.  He will make a decision on whether or not there are any further revascularization attempts for the right leg versus an amputation of the right leg.  [name], MD [facility] Office: [phone] Pager: [phone]

## 2017-12-08 NOTE — Progress Notes (Signed)
Received report. Pt not complaining of pain currently. Urinal in place. 7 fr long sheath in rfa site intact- no oozing or hematoma noted. r dp non-palpable.l fa sheath 7 fr also without oozing or hematoma. Pt. Urinated via urinal 200 cc of drk amber urine. Awaiting therapeutic ACT result to remove sheath. Monitoring.

## 2017-12-09 LAB — CBC WITH DIFFERENTIAL/PLATELET
Basophils Absolute: 0 10*3/uL (ref 0.0–0.1)
Basophils Relative: 0 %
EOS ABS: 0.2 10*3/uL (ref 0.0–0.7)
Eosinophils Relative: 3 %
HEMATOCRIT: 28.3 % — AB (ref 39.0–52.0)
HEMOGLOBIN: 8.9 g/dL — AB (ref 13.0–17.0)
LYMPHS ABS: 1.4 10*3/uL (ref 0.7–4.0)
Lymphocytes Relative: 18 %
MCH: 29.7 pg (ref 26.0–34.0)
MCHC: 31.4 g/dL (ref 30.0–36.0)
MCV: 94.3 fL (ref 78.0–100.0)
MONOS PCT: 10 %
Monocytes Absolute: 0.8 10*3/uL (ref 0.1–1.0)
NEUTROS ABS: 5.5 10*3/uL (ref 1.7–7.7)
NEUTROS PCT: 69 %
Platelets: 252 10*3/uL (ref 150–400)
RBC: 3 MIL/uL — AB (ref 4.22–5.81)
RDW: 13.4 % (ref 11.5–15.5)
WBC: 7.9 10*3/uL (ref 4.0–10.5)

## 2017-12-09 LAB — BASIC METABOLIC PANEL
ANION GAP: 7 (ref 5–15)
BUN: 12 mg/dL (ref 6–20)
CHLORIDE: 104 mmol/L (ref 101–111)
CO2: 23 mmol/L (ref 22–32)
CREATININE: 0.93 mg/dL (ref 0.61–1.24)
Calcium: 8.1 mg/dL — ABNORMAL LOW (ref 8.9–10.3)
GFR calc non Af Amer: >60 mL/min (ref >60)
Glucose, Bld: 145 mg/dL — ABNORMAL HIGH (ref 65–99)
POTASSIUM: 4 mmol/L (ref 3.5–5.1)
SODIUM: 134 mmol/L — AB (ref 135–145)

## 2017-12-09 LAB — GLUCOSE, CAPILLARY
GLUCOSE-CAPILLARY: 139 mg/dL — AB (ref 65–99)
GLUCOSE-CAPILLARY: 175 mg/dL — AB (ref 65–99)
Glucose-Capillary: 188 mg/dL — ABNORMAL HIGH (ref 65–99)
Glucose-Capillary: 103 mg/dL — ABNORMAL HIGH (ref 65–99)

## 2017-12-09 NOTE — Progress Notes (Addendum)
[facility]  Subjective  - Doing well over all, no new complaints.   Objective (!) 141/69 (!) 0 98.7 F (37.1 C) (Oral) 14 94%  Intake/Output Summary (Last 24 hours) at 12/09/2017 0803 Last data filed at 12/09/2017 0520 Gross per 24 hour  Intake -  Output 650 ml  Net -650 ml    Bilateral groin stick sites with out hematoma, soft   Assessment/Planning: POD # 1 Abdominal aortogram with bilateral lower extremity runoff (right and left common iliac stent 7 x 39 VBX, right external iliac stent 6 x 40 self-expanding)  #1 left common iliac stenosis 70% stented to 0 residual stenosis 7 x 39 VBX  2.  Right common iliac 90% stented to 0% residual stenosis 7 x 39 VBX  3.  Right external iliac artery stenosis 90% stented with 6 x 40 self-expanding to 0% residual stenosis  4.  Severe right lower extremity tibial occlusive disease and superficial femoral popliteal occlusive disease.  No named runoff vessel to the right foot  5.  Left superficial femoral artery occlusion with reconstitution of the above-knee popliteal artery one-vessel peroneal runoff to the left foot  Post steting : although the runoff images were improved from prior to stenting the patient still has very poor outflow to the right foot and is at high risk for limb loss in the right leg.   Operative management: Dr. Trula Slade will review the patient's images.  He will make a decision on whether or not there are any further revascularization attempts for the right leg versus an amputation of the right leg.  Stable post op disposition     Roxy Horseman 12/09/2017 8:03 AM --  2+ femoral pulses bilaterally No hematoma Right foot cool still with pain, dry gangrene toes 4 5  Left foot warm  A/P  Pt now has inflow to the groin level.  He does not have revascularization options downstream and is unreconstructable further in the right foot.  I spoke with pt today that most  likely he will end up needing right AKA unless pain resolves.  I would not recommend sending him home on narcotics as the long term solution rather than pain meds is an amputation.  He wants to give it another 48 hours to see if pain will improve.  If it does not would recommend AKA/  Dr Trula Slade will assume pt care tomorrow  [name], MD [facility] [place]: [phone] Pager: [phone]  Laboratory Lab Results: Recent Labs    12/08/17 1552 12/09/17 0314  WBC 8.6 7.9  HGB 10.1* 8.9*  HCT 32.3* 28.3*  PLT 264 252   BMET Recent Labs    12/08/17 0706 12/08/17 1552 12/09/17 0314  NA 134*  --  134*  K 4.1  --  4.0  CL 102  --  104  CO2 24  --  23  GLUCOSE 95  --  145*  BUN 13  --  12  CREATININE 0.99 1.10 0.93  CALCIUM 8.5*  --  8.1*    COAG Lab Results  Component Value Date   INR 1.21 12/08/2017   No results found for: PTT

## 2017-12-09 NOTE — Progress Notes (Signed)
PROGRESS NOTE  Marcus Hendrix YRY:[id number] DOB: [birth year] DOA: 12/05/2017 PCP: [name], MD  Brief History:  76 year old male with a history of diabetes mellitus, coronary artery disease, hyperlipidemia, hypertension, peripheral vascular disease, presenting with approximately 3-week history of right foo swelling, and drainage.  Patient does not recall any recent injury.  The patient was actually admitted to the hospital from November 28, 2017 through November 29, 2017 for chest pain during which time the patient was also treated for cellulitis of the right lower extremity.  He was discharged home with Augmentin and endorsed compliance with the medication.  He went to see his primary care provider on December 05, 2017 who noted increasing pain, edema and erythema.  As result, the patient was sent to the emergency department for further evaluation.  He states that right foot pain has been his major complaint for the past week.  He denies any fevers, chills, chest pain, shortness breath, coughing, hemoptysis, nausea, vomiting, diarrhea.  At the time of admission, WBC was 7.9, and the patient was afebrile hemodynamically stable.  Upon evaluation on the morning of December 06, 2017, the patient's right foot was noted to be cool to the touch.  In addition, the patient stated that for the past week he has had 2 dangle his right lower extremity to gravity to get relief of his pain.  He has also noted some claudication type symptoms.  Assessment/Plan: Ischemia to the right foot -Agree that patient's symptoms show more PVD as a primary cause of presentation with possible critical limb ischemia. -on IV heparin after discussion with Dr. Trula Slade -Pain control with Percocet and gabapentin -Underwent aortogram on 12/08/2017, shows bilateral common iliac stenosis, right external iliac stenosis with severe right tibial, SFA, popliteal disease. -Vascular surgery recommends right AKA unless  patient's pain resolves.  Recommends not sending patient home on narcotics as a long-term solution. -Current plan is to await for next 48 hours to see if the patient's pain improved.  Diabetic foot ulcer -Please see pictures below -will not start antibiotics--afebrile, hemodynamically stable, no leukocytosis -wound care consult -MRI foot negative for any osteomyelitis, shows edema and insufficiency due to poor vasculature. 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 Family Communication:  no Family at bedside  Consultants:    Code Status:  FULL / DNR  DVT Prophylaxis:  IV Heparin   Procedures: As Listed in Progress Note Above  Antibiotics: Ceftriaxone x 1-- 12/21    Subjective: Seen postprocedure, pain well controlled.  No nausea no vomiting.  Objective: Vitals:   12/08/17 1620 12/08/17 1800 12/08/17 2100 12/09/17 0900  BP: (!) 149/67 135/61 (!) 141/69 (!) 118/94  Pulse:      Resp: '15 20 14 15  ' Temp:   98.7 F (37.1 C)   TempSrc:   Oral   SpO2: 98% 97% 94% 98%  Weight:      Height:        Intake/Output Summary (Last 24 hours) at 12/09/2017 1244 Last data filed at 12/09/2017 6648 Gross  per 24 hour  Intake 240 ml  Output 650 ml  Net -410 ml   Weight change:  Exam:   General:  Pt is alert, follows commands appropriately, not in acute distress  HEENT: No icterus, No thrush, No neck mass, [place]/AT  Cardiovascular: RRR, S1/S2, no rubs, no  gallops  Respiratory: CTA bilaterally, no wheezing, no crackles, no rhonchi  Abdomen: Soft/+BS, non tender, non distended, no guarding  Extremities: 2+LE edema, No lymphangitis, No petechiae, No rashes, no synovitis; see pictures below or right foot       Data Reviewed: I have personally reviewed following labs and imaging studies Basic Metabolic Panel: Recent Labs  Lab 12/05/17 1830 12/06/17 0512 12/07/17 0431 12/08/17 0706 12/08/17 1552 12/09/17 0314  NA 134* 136 133* 134*  --  134*  K 4.8 4.6 4.3 4.1  --  4.0  CL 102 101 102 102  --  104  CO2 20* '24 23 24  ' --  23  GLUCOSE 310* 160* 156* 95  --  145*  BUN '18 18 14 13  ' --  12  CREATININE 1.19 1.12 0.99 0.99 1.10 0.93  CALCIUM 8.7* 8.7* 8.5* 8.5*  --  8.1*  MG  --   --   --  2.0  --   --    Liver Function Tests: Recent Labs  Lab 12/05/17 1830  AST 41  ALT 52  ALKPHOS 126  BILITOT 1.2  PROT 7.2  ALBUMIN 3.5   No results for input(s): LIPASE, AMYLASE in the last 168 hours. No results for input(s): AMMONIA in the last 168 hours. Coagulation Profile: Recent Labs  Lab 12/08/17 0706  INR 1.21   CBC: Recent Labs  Lab 12/05/17 1830 12/06/17 0512 12/07/17 0431 12/08/17 0706 12/08/17 1552 12/09/17 0314  WBC 7.9 7.0 7.8 8.3 8.6 7.9  NEUTROABS 5.3 4.5 4.9 4.9  --  5.5  HGB 9.9* 9.9* 10.2* 10.0* 10.1* 8.9*  HCT 31.2* 31.7* 32.4* 31.4* 32.3* 28.3*  MCV 96.0 96.9 93.9 94.3 95.3 94.3  PLT 270 281 275 275 264 252   Cardiac Enzymes: No results for input(s): CKTOTAL, CKMB, CKMBINDEX, TROPONINI in the last 168 hours. BNP: Invalid input(s): POCBNP CBG: Recent Labs  Lab 12/08/17 1422 12/08/17 1638 12/08/17 2123 12/09/17 0655 12/09/17 1048  GLUCAP 87 153* 163* 139* 175*   HbA1C: No results for input(s): HGBA1C in the last 72 hours. Urine analysis:    Component Value Date/Time   COLORURINE YELLOW 11/28/2017 1412   APPEARANCEUR CLEAR 11/28/2017 1412   LABSPEC 1.020 11/28/2017 1412   PHURINE 5.5  11/28/2017 1412   GLUCOSEU >=500 (A) 11/28/2017 1412   HGBUR NEGATIVE 11/28/2017 1412   HGBUR negative 04/15/2008 1334   BILIRUBINUR NEGATIVE 11/28/2017 1412   KETONESUR NEGATIVE 11/28/2017 1412   PROTEINUR NEGATIVE 11/28/2017 1412   UROBILINOGEN 0.2 07/31/2013 0641   NITRITE NEGATIVE 11/28/2017 1412   LEUKOCYTESUR NEGATIVE 11/28/2017 1412   Sepsis Labs: '@LABRCNTIP' (procalcitonin:4,lacticidven:4) ) Recent Results (from the past 240 hour(s))  MRSA PCR Screening     Status: None   Collection Time: 12/07/17  4:33 PM  Result Value Ref Range Status   MRSA by PCR NEGATIVE NEGATIVE Final    Comment:        The GeneXpert MRSA Assay (FDA approved for NASAL specimens only), is one component of a comprehensive MRSA colonization surveillance program. It is not intended to diagnose MRSA infection nor to guide or monitor treatment for MRSA infections.      Scheduled Meds: .  amLODipine  10 mg Oral Daily  . aspirin EC  81 mg Oral Daily  . atorvastatin  80 mg Oral Daily  . feeding supplement (GLUCERNA SHAKE)  237 mL Oral BID BM  . feeding supplement (PRO-STAT SUGAR FREE 64)  30 mL Oral BID  . furosemide  40 mg Oral Daily  . gabapentin  100 mg Oral TID  . heparin  5,000 Units Subcutaneous Q8H  . insulin aspart  0-15 Units Subcutaneous TID WC  . insulin glargine  30 Units Subcutaneous q morning - 10a  . lisinopril  20 mg Oral Daily  . metoprolol tartrate  100 mg Oral BID  . polyethylene glycol  17 g Oral Daily  . sodium chloride flush  3 mL Intravenous Q12H  . timolol  1 drop Left Eye q morning - 10a   Continuous Infusions: . sodium chloride    . cefTRIAXone (ROCEPHIN)  IV Stopped (12/08/17 2226)    Procedures/Studies: Dg Chest 2 View  Result Date: 11/28/2017 CLINICAL DATA:  Dyspnea and chest pain with nonproductive cough and wheezing x1 week. EXAM: CHEST  2 VIEW COMPARISON:  05/22/2016 FINDINGS: Stable cardiomegaly with moderate aortic atherosclerosis. Status post median  sternotomy. Diffuse interstitial prominence and peribronchial thickening consistent bronchitic change. No alveolar consolidation. Chronic posterior costophrenic angle blunting that may represent a small effusion or pleural thickening. No acute osseous abnormality. IMPRESSION: Stable cardiomegaly with aortic atherosclerosis. Peribronchial thickening with increased interstitial lung markings suspicious for bronchitic change. Electronically Signed   By: Ashley Royalty M.D.   On: 11/28/2017 20:14   Mr Foot Right Wo Contrast  Result Date: 12/06/2017 CLINICAL DATA:  Right foot ulceration dorsal along the first MTP joint. Purulent collection. Swelling. EXAM: MRI OF THE RIGHT FOREFOOT WITHOUT CONTRAST TECHNIQUE: Multiplanar, multisequence MR imaging of the right forefoot was performed. No intravenous contrast was administered. COMPARISON:  Radiographs from 12/05/2017 FINDINGS: Despite efforts by the technologist and patient, motion artifact is present on today's exam and could not be eliminated. This reduces exam sensitivity and specificity. Bones/Joint/Cartilage There is an unusual pattern with edema signal tracking within all of the phalanges and in the heads of the metatarsals even on inversion recovery weighted images. There is some associated reduced T1 signal especially in the small digit phalanges. Ligaments Lisfranc ligament intact. Muscles and Tendons Diffuse edema tracks along the plantar musculature of the foot Soft tissues Extensive dorsal subcutaneous edema tracking along the foot and into the toes. IMPRESSION: 1. Unusual pattern with low-grade edema signal in all of the phalanges and the heads of the metatarsals. This is more striking in the small toe. There is also diffuse subcutaneous edema tracking into the toes. He would be unusual for osteomyelitis to involve all of the phalanges of the toes and metatarsal heads at once, and the appearance is more suggestive of a systemic or global phenomenon in the  distal foot, possibly edema due to vascular insufficiency or reflex sympathetic dystrophy. There is currently no drainable abscess. Careful surveillance may be warranted. 2. Diffuse edema within along the plantar musculature of the foot, probably neurogenic although myositis is not readily excluded on today's noncontrast exam. I do not see any gas tracking in the soft tissues of the foot. Electronically Signed   By: Van Clines M.D.   On: 12/06/2017 18:44   Dg Foot Complete Right  Result Date: 12/05/2017 CLINICAL DATA:  Soft tissue wound right foot. EXAM: RIGHT FOOT COMPLETE - 3+ VIEW COMPARISON:  11/28/2017 FINDINGS: Mild degenerate change of the  first MTP joint and midfoot. No evidence of bone destruction to suggest osteomyelitis. No air in the soft tissues. IMPRESSION: No acute findings. Electronically Signed   By: Marin Olp M.D.   On: 12/05/2017 19:19   Dg Foot Complete Right  Result Date: 11/28/2017 CLINICAL DATA:  Right foot pain and swelling. EXAM: RIGHT FOOT COMPLETE - 3+ VIEW COMPARISON:  None. FINDINGS: There is no evidence of fracture or dislocation. No focal bone erosions. Mild hallux valgus deformity with degenerative changes at the first MTP joint. Soft tissues are unremarkable. IMPRESSION: No acute bone abnormalities. Electronically Signed   By: Kerby Moors M.D.   On: 11/28/2017 17:38    Author:  Berle Mull, MD [facility] Pager: [phone] 12/09/2017 12:44 PM     If 7PM-7AM, please contact night-coverage [email or web address] Password [email or web address] 12/09/2017, 12:44 PM   LOS: 4 days

## 2017-12-10 ENCOUNTER — Encounter (HOSPITAL_COMMUNITY): Payer: Self-pay | Admitting: Vascular Surgery

## 2017-12-10 LAB — CBC WITH DIFFERENTIAL/PLATELET
BASOS ABS: 0 10*3/uL (ref 0.0–0.1)
BASOS PCT: 0 %
EOS PCT: 3 %
Eosinophils Absolute: 0.3 10*3/uL (ref 0.0–0.7)
HCT: 29.2 % — ABNORMAL LOW (ref 39.0–52.0)
Hemoglobin: 9.3 g/dL — ABNORMAL LOW (ref 13.0–17.0)
Lymphocytes Relative: 18 %
Lymphs Abs: 1.8 10*3/uL (ref 0.7–4.0)
MCH: 30.3 pg (ref 26.0–34.0)
MCHC: 31.8 g/dL (ref 30.0–36.0)
MCV: 95.1 fL (ref 78.0–100.0)
MONO ABS: 0.6 10*3/uL (ref 0.1–1.0)
Monocytes Relative: 6 %
NEUTROS ABS: 7.2 10*3/uL (ref 1.7–7.7)
Neutrophils Relative %: 73 %
PLATELETS: 247 10*3/uL (ref 150–400)
RBC: 3.07 MIL/uL — AB (ref 4.22–5.81)
RDW: 13.6 % (ref 11.5–15.5)
WBC: 9.9 10*3/uL (ref 4.0–10.5)

## 2017-12-10 LAB — BASIC METABOLIC PANEL
Anion gap: 4 — ABNORMAL LOW (ref 5–15)
BUN: 10 mg/dL (ref 6–20)
CALCIUM: 8.1 mg/dL — AB (ref 8.9–10.3)
CO2: 25 mmol/L (ref 22–32)
CREATININE: 1.02 mg/dL (ref 0.61–1.24)
Chloride: 106 mmol/L (ref 101–111)
GFR calc Af Amer: >60 mL/min (ref >60)
GLUCOSE: 59 mg/dL — AB (ref 65–99)
Potassium: 3.8 mmol/L (ref 3.5–5.1)
SODIUM: 135 mmol/L (ref 135–145)

## 2017-12-10 LAB — GLUCOSE, CAPILLARY
Glucose-Capillary: 53 mg/dL — ABNORMAL LOW (ref 65–99)
Glucose-Capillary: 94 mg/dL (ref 65–99)
Glucose-Capillary: 118 mg/dL — ABNORMAL HIGH (ref 65–99)
Glucose-Capillary: 170 mg/dL — ABNORMAL HIGH (ref 65–99)

## 2017-12-10 MED ORDER — INSULIN ASPART 100 UNIT/ML ~~LOC~~ SOLN
0.0000 [IU] | Freq: Three times a day (TID) | SUBCUTANEOUS | Status: DC
  Administered 2017-12-11: 3 [IU] via SUBCUTANEOUS
  Administered 2017-12-11 – 2017-12-12 (×2): 2 [IU] via SUBCUTANEOUS
  Administered 2017-12-13 (×3): 9 [IU] via SUBCUTANEOUS
  Administered 2017-12-14: 3 [IU] via SUBCUTANEOUS
  Administered 2017-12-14 (×2): 7 [IU] via SUBCUTANEOUS
  Administered 2017-12-15: 2 [IU] via SUBCUTANEOUS
  Administered 2017-12-15: 5 [IU] via SUBCUTANEOUS
  Administered 2017-12-15 – 2017-12-17 (×3): 3 [IU] via SUBCUTANEOUS
  Administered 2017-12-18: 2 [IU] via SUBCUTANEOUS
  Administered 2017-12-19: 3 [IU] via SUBCUTANEOUS

## 2017-12-10 MED ORDER — INSULIN ASPART 100 UNIT/ML ~~LOC~~ SOLN
0.0000 [IU] | Freq: Every day | SUBCUTANEOUS | Status: DC
  Administered 2017-12-13: 5 [IU] via SUBCUTANEOUS

## 2017-12-10 MED ORDER — INSULIN GLARGINE 100 UNIT/ML ~~LOC~~ SOLN
20.0000 [IU] | Freq: Every morning | SUBCUTANEOUS | Status: DC
  Administered 2017-12-11 – 2017-12-13 (×2): 20 [IU] via SUBCUTANEOUS
  Filled 2017-12-10 (×3): qty 0.2

## 2017-12-10 NOTE — Care Management Important Message (Signed)
Important Message  Patient Details  Name: Marcus Hendrix MRN: [id number] Date of Birth: 1941/04/04   Medicare Important Message Given:  Yes    Orbie Pyo 12/10/2017, 2:28 PM

## 2017-12-10 NOTE — Progress Notes (Signed)
PROGRESS NOTE  Marcus Hendrix UXN:[id number] DOB: 11-02-1941 DOA: 12/05/2017 PCP: [name], MD  Brief History:  76 year old male with a history of diabetes mellitus, coronary artery disease, hyperlipidemia, hypertension, peripheral vascular disease, presenting with approximately 3-week history of right foo swelling, and drainage.  Patient does not recall any recent injury.  The patient was actually admitted to the hospital from November 28, 2017 through November 29, 2017 for chest pain during which time the patient was also treated for cellulitis of the right lower extremity.  He was discharged home with Augmentin and endorsed compliance with the medication.  He went to see his primary care provider on December 05, 2017 who noted increasing pain, edema and erythema.  As result, the patient was sent to the emergency department for further evaluation.  He states that right foot pain has been his major complaint for the past week.  He denies any fevers, chills, chest pain, shortness breath, coughing, hemoptysis, nausea, vomiting, diarrhea.  At the time of admission, WBC was 7.9, and the patient was afebrile hemodynamically stable.  Upon evaluation on the morning of December 06, 2017, the patient's right foot was noted to be cool to the touch.  In addition, the patient stated that for the past week he has had 2 dangle his right lower extremity to gravity to get relief of his pain.  He has also noted some claudication type symptoms.  Assessment/Plan: Ischemia to the right foot -Agree that patient's symptoms show more PVD as a primary cause of presentation with possible critical limb ischemia. -on IV heparin after discussion with Dr. Trula Slade -Pain control with Percocet and gabapentin -Underwent aortogram on 12/08/2017, shows bilateral common iliac stenosis, right external iliac stenosis with severe right tibial, SFA, popliteal disease. -Vascular surgery recommends right AKA unless  patient's pain resolves.  Recommends not sending patient home on narcotics as a long-term solution. -Current plan is to await for tomorrow to see if the patient's pain improved versus amputation.  Diabetic foot ulcer -Please see pictures below -will not start antibiotics--afebrile, hemodynamically stable, no leukocytosis -wound care consult -MRI foot negative for any osteomyelitis, shows edema and insufficiency due to poor vasculature. 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 Family Communication:  no Family at bedside  Consultants:    Code Status:  FULL / DNR  DVT Prophylaxis:  IV Heparin   Procedures: As Listed in Progress Note Above  Antibiotics: Ceftriaxone x 1-- 12/21    Subjective: Pain remains unchanged, no nausea no vomiting no diarrhea no constipation.  Minimal oral intake.  Objective: Vitals:   12/10/17 0448 12/10/17 1212 12/10/17 1300 12/10/17 1429  BP: (!) 180/61 (!) 161/76  (!) 146/55  Pulse: 79 97  81  Resp:  20 (!) 24 18  Temp: 98.9 F (37.2 C) 98.1 F (36.7 C)  99.4 F (37.4 C)  TempSrc: Oral Oral  Oral  SpO2:  96% 93% 94%  Weight:      Height:  Intake/Output Summary (Last 24 hours) at 12/10/2017 1903 Last data filed at 12/10/2017 3151 Gross per 24 hour  Intake 600 ml  Output 200 ml  Net 400 ml   Weight change:  Exam:   General:  Pt is alert, follows commands appropriately, not in  acute distress  HEENT: No icterus, No thrush, No neck mass, [place]/AT  Cardiovascular: RRR, S1/S2, no rubs, no gallops  Respiratory: CTA bilaterally, no wheezing, no crackles, no rhonchi  Abdomen: Soft/+BS, non tender, non distended, no guarding  Extremities: 2+LE edema, No lymphangitis, No petechiae, No rashes, no synovitis; see pictures below or right foot       Data Reviewed: I have personally reviewed following labs and imaging studies Basic Metabolic Panel: Recent Labs  Lab 12/06/17 0512 12/07/17 0431 12/08/17 0706 12/08/17 1552 12/09/17 0314 12/10/17 0237  NA 136 133* 134*  --  134* 135  K 4.6 4.3 4.1  --  4.0 3.8  CL 101 102 102  --  104 106  CO2 '24 23 24  ' --  23 25  GLUCOSE 160* 156* 95  --  145* 59*  BUN '18 14 13  ' --  12 10  CREATININE 1.12 0.99 0.99 1.10 0.93 1.02  CALCIUM 8.7* 8.5* 8.5*  --  8.1* 8.1*  MG  --   --  2.0  --   --   --    Liver Function Tests: Recent Labs  Lab 12/05/17 1830  AST 41  ALT 52  ALKPHOS 126  BILITOT 1.2  PROT 7.2  ALBUMIN 3.5   No results for input(s): LIPASE, AMYLASE in the last 168 hours. No results for input(s): AMMONIA in the last 168 hours. Coagulation Profile: Recent Labs  Lab 12/08/17 0706  INR 1.21   CBC: Recent Labs  Lab 12/06/17 0512 12/07/17 0431 12/08/17 0706 12/08/17 1552 12/09/17 0314 12/10/17 0237  WBC 7.0 7.8 8.3 8.6 7.9 9.9  NEUTROABS 4.5 4.9 4.9  --  5.5 7.2  HGB 9.9* 10.2* 10.0* 10.1* 8.9* 9.3*  HCT 31.7* 32.4* 31.4* 32.3* 28.3* 29.2*  MCV 96.9 93.9 94.3 95.3 94.3 95.1  PLT 281 275 275 264 252 247   Cardiac Enzymes: No results for input(s): CKTOTAL, CKMB, CKMBINDEX, TROPONINI in the last 168 hours. BNP: Invalid input(s): POCBNP CBG: Recent Labs  Lab 12/09/17 1746 12/09/17 2041 12/10/17 0607 12/10/17 1125 12/10/17 1622  GLUCAP 188* 103* 53* 94 118*   HbA1C: No results for input(s): HGBA1C in the last 72 hours. Urine analysis:    Component Value Date/Time   COLORURINE YELLOW  11/28/2017 1412   APPEARANCEUR CLEAR 11/28/2017 1412   LABSPEC 1.020 11/28/2017 1412   PHURINE 5.5 11/28/2017 1412   GLUCOSEU >=500 (A) 11/28/2017 1412   HGBUR NEGATIVE 11/28/2017 1412   HGBUR negative 04/15/2008 1334   BILIRUBINUR NEGATIVE 11/28/2017 1412   KETONESUR NEGATIVE 11/28/2017 1412   PROTEINUR NEGATIVE 11/28/2017 1412   UROBILINOGEN 0.2 07/31/2013 0641   NITRITE NEGATIVE 11/28/2017 1412   LEUKOCYTESUR NEGATIVE 11/28/2017 1412   Sepsis Labs: '@LABRCNTIP' (procalcitonin:4,lacticidven:4) ) Recent Results (from the past 240 hour(s))  MRSA PCR Screening     Status: None   Collection Time: 12/07/17  4:33 PM  Result Value Ref Range Status   MRSA by PCR NEGATIVE NEGATIVE Final    Comment:        The GeneXpert MRSA Assay (FDA approved for NASAL specimens only), is one component of a comprehensive MRSA colonization surveillance program. It is not intended to diagnose MRSA infection nor to  guide or monitor treatment for MRSA infections.      Scheduled Meds: . amLODipine  10 mg Oral Daily  . aspirin EC  81 mg Oral Daily  . atorvastatin  80 mg Oral Daily  . feeding supplement (GLUCERNA SHAKE)  237 mL Oral BID BM  . feeding supplement (PRO-STAT SUGAR FREE 64)  30 mL Oral BID  . furosemide  40 mg Oral Daily  . gabapentin  100 mg Oral TID  . heparin  5,000 Units Subcutaneous Q8H  . insulin aspart  0-5 Units Subcutaneous QHS  . insulin aspart  0-9 Units Subcutaneous TID WC  . [START ON 12/11/2017] insulin glargine  20 Units Subcutaneous q morning - 10a  . lisinopril  20 mg Oral Daily  . metoprolol tartrate  100 mg Oral BID  . polyethylene glycol  17 g Oral Daily  . sodium chloride flush  3 mL Intravenous Q12H  . timolol  1 drop Left Eye q morning - 10a   Continuous Infusions: . sodium chloride    . cefTRIAXone (ROCEPHIN)  IV Stopped (12/10/17 0001)    Procedures/Studies: Dg Chest 2 View  Result Date: 11/28/2017 CLINICAL DATA:  Dyspnea and chest pain with  nonproductive cough and wheezing x1 week. EXAM: CHEST  2 VIEW COMPARISON:  05/22/2016 FINDINGS: Stable cardiomegaly with moderate aortic atherosclerosis. Status post median sternotomy. Diffuse interstitial prominence and peribronchial thickening consistent bronchitic change. No alveolar consolidation. Chronic posterior costophrenic angle blunting that may represent a small effusion or pleural thickening. No acute osseous abnormality. IMPRESSION: Stable cardiomegaly with aortic atherosclerosis. Peribronchial thickening with increased interstitial lung markings suspicious for bronchitic change. Electronically Signed   By: Ashley Royalty M.D.   On: 11/28/2017 20:14   Mr Foot Right Wo Contrast  Result Date: 12/06/2017 CLINICAL DATA:  Right foot ulceration dorsal along the first MTP joint. Purulent collection. Swelling. EXAM: MRI OF THE RIGHT FOREFOOT WITHOUT CONTRAST TECHNIQUE: Multiplanar, multisequence MR imaging of the right forefoot was performed. No intravenous contrast was administered. COMPARISON:  Radiographs from 12/05/2017 FINDINGS: Despite efforts by the technologist and patient, motion artifact is present on today's exam and could not be eliminated. This reduces exam sensitivity and specificity. Bones/Joint/Cartilage There is an unusual pattern with edema signal tracking within all of the phalanges and in the heads of the metatarsals even on inversion recovery weighted images. There is some associated reduced T1 signal especially in the small digit phalanges. Ligaments Lisfranc ligament intact. Muscles and Tendons Diffuse edema tracks along the plantar musculature of the foot Soft tissues Extensive dorsal subcutaneous edema tracking along the foot and into the toes. IMPRESSION: 1. Unusual pattern with low-grade edema signal in all of the phalanges and the heads of the metatarsals. This is more striking in the small toe. There is also diffuse subcutaneous edema tracking into the toes. He would be unusual  for osteomyelitis to involve all of the phalanges of the toes and metatarsal heads at once, and the appearance is more suggestive of a systemic or global phenomenon in the distal foot, possibly edema due to vascular insufficiency or reflex sympathetic dystrophy. There is currently no drainable abscess. Careful surveillance may be warranted. 2. Diffuse edema within along the plantar musculature of the foot, probably neurogenic although myositis is not readily excluded on today's noncontrast exam. I do not see any gas tracking in the soft tissues of the foot. Electronically Signed   By: Van Clines M.D.   On: 12/06/2017 18:44   Dg Foot Complete Right  Result Date: 12/05/2017 CLINICAL DATA:  Soft tissue wound right foot. EXAM: RIGHT FOOT COMPLETE - 3+ VIEW COMPARISON:  11/28/2017 FINDINGS: Mild degenerate change of the first MTP joint and midfoot. No evidence of bone destruction to suggest osteomyelitis. No air in the soft tissues. IMPRESSION: No acute findings. Electronically Signed   By: Marin Olp M.D.   On: 12/05/2017 19:19   Dg Foot Complete Right  Result Date: 11/28/2017 CLINICAL DATA:  Right foot pain and swelling. EXAM: RIGHT FOOT COMPLETE - 3+ VIEW COMPARISON:  None. FINDINGS: There is no evidence of fracture or dislocation. No focal bone erosions. Mild hallux valgus deformity with degenerative changes at the first MTP joint. Soft tissues are unremarkable. IMPRESSION: No acute bone abnormalities. Electronically Signed   By: Kerby Moors M.D.   On: 11/28/2017 17:38    Author:  Berle Mull, MD [facility] Pager: [phone] 12/10/2017 7:03 PM     If 7PM-7AM, please contact night-coverage [email or web address] Password [email or web address] 12/10/2017, 7:03 PM   LOS: 5 days

## 2017-12-10 NOTE — Progress Notes (Addendum)
[facility]  Subjective  - Doing OK, has right LE hanging off bed due to pain.   Objective (!) 180/61 79 98.9 F (37.2 C) (Oral) 15 97%  Intake/Output Summary (Last 24 hours) at 12/10/2017 0738 Last data filed at 12/10/2017 5537 Gross per 24 hour  Intake 1080 ml  Output 200 ml  Net 880 ml    Right foot toes 2-5 have ischemic wounds and foot is cold to touch. Bilateral groins soft with out hematoma s/p angiogram sticks Femoral pulses B  Assessment/Planning: POD # 2 Abdominal aortogram with bilateral lower extremity runoff (right and left common iliac stent 7 x 39 VBX, right external iliac stent 6 x 40 self-expanding) Right LE rest pain  Very poor outflow to the right foot and is at high risk for limb loss in the right leg.        Roxy Horseman 12/10/2017 7:38 AM --  Laboratory Lab Results: Recent Labs    12/09/17 0314 12/10/17 0237  WBC 7.9 9.9  HGB 8.9* 9.3*  HCT 28.3* 29.2*  PLT 252 247   BMET Recent Labs    12/09/17 0314 12/10/17 0237  NA 134* 135  K 4.0 3.8  CL 104 106  CO2 23 25  GLUCOSE 145* 59*  BUN 12 10  CREATININE 0.93 1.02  CALCIUM 8.1* 8.1*    COAG Lab Results  Component Value Date   INR 1.21 12/08/2017   No results found for: PTT  Will make definitive plan regarding amputation tomorrow.  Annamarie Major

## 2017-12-11 DIAGNOSIS — E1152 Type 2 diabetes mellitus with diabetic peripheral angiopathy with gangrene: Secondary | ICD-10-CM

## 2017-12-11 LAB — CBC WITH DIFFERENTIAL/PLATELET
BASOS ABS: 0 10*3/uL (ref 0.0–0.1)
Basophils Relative: 0 %
Eosinophils Absolute: 0.1 10*3/uL (ref 0.0–0.7)
Eosinophils Relative: 1 %
HEMATOCRIT: 28.8 % — AB (ref 39.0–52.0)
Hemoglobin: 9.2 g/dL — ABNORMAL LOW (ref 13.0–17.0)
LYMPHS PCT: 14 %
Lymphs Abs: 1.6 10*3/uL (ref 0.7–4.0)
MCH: 30.2 pg (ref 26.0–34.0)
MCHC: 31.9 g/dL (ref 30.0–36.0)
MCV: 94.4 fL (ref 78.0–100.0)
MONO ABS: 1.3 10*3/uL — AB (ref 0.1–1.0)
Monocytes Relative: 11 %
NEUTROS ABS: 8.5 10*3/uL — AB (ref 1.7–7.7)
Neutrophils Relative %: 74 %
Platelets: 233 10*3/uL (ref 150–400)
RBC: 3.05 MIL/uL — AB (ref 4.22–5.81)
RDW: 13.6 % (ref 11.5–15.5)
WBC: 11.5 10*3/uL — ABNORMAL HIGH (ref 4.0–10.5)

## 2017-12-11 LAB — BASIC METABOLIC PANEL
ANION GAP: 7 (ref 5–15)
BUN: 13 mg/dL (ref 6–20)
CO2: 24 mmol/L (ref 22–32)
Calcium: 8.2 mg/dL — ABNORMAL LOW (ref 8.9–10.3)
Chloride: 102 mmol/L (ref 101–111)
Creatinine, Ser: 1.02 mg/dL (ref 0.61–1.24)
GFR calc Af Amer: >60 mL/min (ref >60)
GFR calc non Af Amer: >60 mL/min (ref >60)
GLUCOSE: 154 mg/dL — AB (ref 65–99)
POTASSIUM: 3.8 mmol/L (ref 3.5–5.1)
Sodium: 133 mmol/L — ABNORMAL LOW (ref 135–145)

## 2017-12-11 LAB — GLUCOSE, CAPILLARY
GLUCOSE-CAPILLARY: 151 mg/dL — AB (ref 65–99)
GLUCOSE-CAPILLARY: 222 mg/dL — AB (ref 65–99)
GLUCOSE-CAPILLARY: 171 mg/dL — AB (ref 65–99)

## 2017-12-11 NOTE — Care Management Note (Signed)
Case Management Note Marvetta Gibbons RN, BSN Unit 4E-Case Manager [phone]  Patient Details  Name: Marcus Hendrix MRN: [id number] Date of Birth: 30-Jun-1941  Subjective/Objective:  Pt admitted with DM foot infection- right foot ischemia- pt will need right AKA- plan for 12/12/17-                 Action/Plan: PTA pt lived at home with spouse- CM to follow post AKA for discharge/transition needs- will need PT/OT evals for recommendations   Expected Discharge Date:                  Expected Discharge Plan:     In-House Referral:     Discharge planning Services  CM Consult  Post Acute Care Choice:    Choice offered to:     DME Arranged:    DME Agency:     HH Arranged:    HH Agency:     Status of Service:  In process, will continue to follow  If discussed at Long Length of Stay Meetings, dates discussed:    Discharge Disposition:   Additional Comments:  Dawayne Patricia, RN 12/11/2017, 11:27 AM

## 2017-12-11 NOTE — Plan of Care (Signed)
  Coping: Level of anxiety will decrease 12/11/2017 2304 - Progressing by Lesle Reek, RN   Pain Managment: General experience of comfort will improve 12/11/2017 2304 - Progressing by Lesle Reek, RN

## 2017-12-11 NOTE — Progress Notes (Signed)
    Subjective  -   Still having rest pain   Physical Exam:  Open wounds to right foot Groin site soft abd soft Respirations non-labored       Assessment/Plan:    Patient has agreed to a right AKA.  We discussed doing this tomorrow.  He is going to think about it today.  He will need to be NPO after midnight.  I discussed with the patient that he has no options for revascularization.  He has no named vessels below the calf.  There is CFA stenosis, however, this would not help with wound healing on his foot  Wells Brabham 12/11/2017 7:55 AM --  Vitals:   12/10/17 2105 12/11/17 0353  BP: (!) 141/56 (!) 147/60  Pulse: 73 64  Resp: 16 18  Temp: 99.8 F (37.7 C) 99 F (37.2 C)  SpO2: 97% 97%   No intake or output data in the 24 hours ending 12/11/17 0755   Laboratory CBC    Component Value Date/Time   WBC 11.5 (H) 12/11/2017 0340   HGB 9.2 (L) 12/11/2017 0340   HCT 28.8 (L) 12/11/2017 0340   PLT 233 12/11/2017 0340    BMET    Component Value Date/Time   NA 133 (L) 12/11/2017 0340   K 3.8 12/11/2017 0340   CL 102 12/11/2017 0340   CO2 24 12/11/2017 0340   GLUCOSE 154 (H) 12/11/2017 0340   BUN 13 12/11/2017 0340   CREATININE 1.02 12/11/2017 0340   CALCIUM 8.2 (L) 12/11/2017 0340   GFRNONAA >60 12/11/2017 0340   GFRAA >60 12/11/2017 0340    COAG Lab Results  Component Value Date   INR 1.21 12/08/2017   No results found for: PTT  Antibiotics Anti-infectives (From admission, onward)   Start     Dose/Rate Route Frequency Ordered Stop   12/06/17 2230  cefTRIAXone (ROCEPHIN) 1 g in dextrose 5 % 50 mL IVPB     1 g 100 mL/hr over 30 Minutes Intravenous Every 24 hours 12/06/17 0740     12/05/17 2230  cefTRIAXone (ROCEPHIN) 1 g in dextrose 5 % 50 mL IVPB     1 g Intravenous  Once 12/05/17 2222 12/05/17 2340       V. Leia Alf, M.D. [facility] [place] Office: [phone] Pager:  [phone]

## 2017-12-11 NOTE — Progress Notes (Signed)
PROGRESS NOTE  Marcus Hendrix:[id number] DOB: 1941-08-18 DOA: 12/05/2017 PCP: [name], MD  Brief History:  76 year old male with a history of diabetes mellitus, coronary artery disease, hyperlipidemia, hypertension, peripheral vascular disease, presenting with approximately 3-week history of right foo swelling, and drainage.  Patient does not recall any recent injury.  The patient was actually admitted to the hospital from November 28, 2017 through November 29, 2017 for chest pain during which time the patient was also treated for cellulitis of the right lower extremity.  He was discharged home with Augmentin and endorsed compliance with the medication.  He went to see his primary care provider on December 05, 2017 who noted increasing pain, edema and erythema.  As result, the patient was sent to the emergency department for further evaluation.  He states that right foot pain has been his major complaint for the past week.  He denies any fevers, chills, chest pain, shortness breath, coughing, hemoptysis, nausea, vomiting, diarrhea.  At the time of admission, WBC was 7.9, and the patient was afebrile hemodynamically stable.  Upon evaluation on the morning of December 06, 2017, the patient's right foot was noted to be cool to the touch.  In addition, the patient stated that for the past week he has had 2 dangle his right lower extremity to gravity to get relief of his pain.  He has also noted some claudication type symptoms.  Assessment/Plan: Ischemia to the right foot -patient's symptoms show more PVD as a primary cause of presentation with possible critical limb ischemia. -Initially started on on IV heparin after discussion with Dr. Trula Slade, now subcu DVT prophylaxis dose. -Pain control with Percocet and gabapentin -Underwent aortogram on 12/08/2017, shows bilateral common iliac stenosis, right external iliac stenosis with severe right tibial, SFA, popliteal disease. -Vascular  surgery recommends right AKA, patient agreeable right now. N.p.o. after midnight.  Diabetic foot ulcer -Please see pictures below -will not start antibiotics--afebrile, hemodynamically stable, no leukocytosis -wound care consult -MRI foot negative for any osteomyelitis, shows edema and insufficiency due to poor vasculature. -ESR, CRP minimally elevated Started on ceftriaxone by-vascular surgery, most likely will not need at this postoperatively.  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  Most likely will need SNF.  Family Communication:  no Family at bedside  Consultants:    Code Status:  FULL / DNR  DVT Prophylaxis: Subcu heparin   Procedures: As Listed in Progress Note Above  Antibiotics: Ceftriaxone     Subjective: Pain still remains unchanged, after discussion with vascular surgery patient is agreeable to undergo right AKA.  No nausea no vomiting.  Denies any constipation or diarrhea.  Objective: Vitals:   12/10/17 2105 12/11/17 0353 12/11/17 0830 12/11/17 1237  BP: (!) 141/56 (!) 147/60 (!) 127/108 133/68  Pulse: 73 64 67   Resp: 16 18  (!) 21  Temp: 99.8 F (37.7 C) 99 F (37.2 C)  99.1 F (37.3 C)  TempSrc: Oral Oral  Oral  SpO2:  97% 97%  98%  Weight:      Height:        Intake/Output Summary (Last 24 hours) at 12/11/2017 1617 Last data filed at 12/11/2017 1240 Gross per 24 hour  Intake -    Output 150 ml  Net -150 ml   Weight change:  Exam:   General:  Pt is alert, follows commands appropriately, not in acute distress  HEENT: No icterus, No thrush, No neck mass, [place]/AT  Cardiovascular: RRR, S1/S2, no rubs, no gallops  Respiratory: CTA bilaterally, no wheezing, no crackles, no rhonchi  Abdomen: Soft/+BS, non tender, non distended, no guarding  Extremities: 2+LE edema, No lymphangitis, No petechiae, No rashes, no synovitis; see pictures below or right foot       Data Reviewed: I have personally reviewed following labs and imaging studies Basic Metabolic Panel: Recent Labs  Lab 12/07/17 0431 12/08/17 0706 12/08/17 1552 12/09/17 0314 12/10/17 0237 12/11/17 0340  NA 133* 134*  --  134* 135 133*  K 4.3 4.1  --  4.0 3.8 3.8  CL 102 102  --  104 106 102  CO2 23 24  --  '23 25 24  ' GLUCOSE 156* 95  --  145* 59* 154*  BUN 14 13  --  '12 10 13  ' CREATININE 0.99 0.99 1.10 0.93 1.02 1.02  CALCIUM 8.5* 8.5*  --  8.1* 8.1* 8.2*  MG  --  2.0  --   --   --   --    Liver Function Tests: Recent Labs  Lab 12/05/17 1830  AST 41  ALT 52  ALKPHOS 126  BILITOT 1.2  PROT 7.2  ALBUMIN 3.5   No results for input(s): LIPASE, AMYLASE in the last 168 hours. No results for input(s): AMMONIA in the last 168 hours. Coagulation Profile: Recent Labs  Lab 12/08/17 0706  INR 1.21   CBC: Recent Labs  Lab 12/07/17 0431 12/08/17 0706 12/08/17 1552 12/09/17 0314 12/10/17 0237 12/11/17 0340  WBC 7.8 8.3 8.6 7.9 9.9 11.5*  NEUTROABS 4.9 4.9  --  5.5 7.2 8.5*  HGB 10.2* 10.0* 10.1* 8.9* 9.3* 9.2*  HCT 32.4* 31.4* 32.3* 28.3* 29.2* 28.8*  MCV 93.9 94.3 95.3 94.3 95.1 94.4  PLT 275 275 264 252 247 233   Cardiac Enzymes: No results for input(s): CKTOTAL, CKMB, CKMBINDEX, TROPONINI in the last 168 hours. BNP: Invalid input(s): POCBNP CBG: Recent Labs  Lab 12/10/17 0607 12/10/17 1125 12/10/17 1622 12/10/17 2104 12/11/17 0631  GLUCAP 53* 94 118* 170* 151*    HbA1C: No results for input(s): HGBA1C in the last 72 hours. Urine analysis:    Component Value Date/Time   COLORURINE YELLOW 11/28/2017 1412   APPEARANCEUR CLEAR 11/28/2017 1412   LABSPEC 1.020 11/28/2017 1412   PHURINE 5.5 11/28/2017 1412   GLUCOSEU >=500 (A) 11/28/2017 1412   HGBUR NEGATIVE 11/28/2017 1412   HGBUR negative 04/15/2008 1334   BILIRUBINUR NEGATIVE 11/28/2017 1412   KETONESUR NEGATIVE 11/28/2017 1412   PROTEINUR NEGATIVE 11/28/2017 1412   UROBILINOGEN 0.2 07/31/2013 0641   NITRITE NEGATIVE 11/28/2017 1412   LEUKOCYTESUR NEGATIVE 11/28/2017 1412   Sepsis Labs: '@LABRCNTIP' (procalcitonin:4,lacticidven:4) ) Recent Results (from the past 240 hour(s))  MRSA PCR Screening     Status: None   Collection Time: 12/07/17  4:33 PM  Result Value Ref Range Status   MRSA by PCR NEGATIVE NEGATIVE Final    Comment:        The GeneXpert MRSA Assay (FDA approved for NASAL specimens only), is  one component of a comprehensive MRSA colonization surveillance program. It is not intended to diagnose MRSA infection nor to guide or monitor treatment for MRSA infections.      Scheduled Meds: . amLODipine  10 mg Oral Daily  . aspirin EC  81 mg Oral Daily  . atorvastatin  80 mg Oral Daily  . feeding supplement (GLUCERNA SHAKE)  237 mL Oral BID BM  . feeding supplement (PRO-STAT SUGAR FREE 64)  30 mL Oral BID  . furosemide  40 mg Oral Daily  . gabapentin  100 mg Oral TID  . heparin  5,000 Units Subcutaneous Q8H  . insulin aspart  0-5 Units Subcutaneous QHS  . insulin aspart  0-9 Units Subcutaneous TID WC  . insulin glargine  20 Units Subcutaneous q morning - 10a  . lisinopril  20 mg Oral Daily  . metoprolol tartrate  100 mg Oral BID  . polyethylene glycol  17 g Oral Daily  . sodium chloride flush  3 mL Intravenous Q12H  . timolol  1 drop Left Eye q morning - 10a   Continuous Infusions: . sodium chloride    . cefTRIAXone (ROCEPHIN)  IV Stopped (12/11/17 4166)     Procedures/Studies: Dg Chest 2 View  Result Date: 11/28/2017 CLINICAL DATA:  Dyspnea and chest pain with nonproductive cough and wheezing x1 week. EXAM: CHEST  2 VIEW COMPARISON:  05/22/2016 FINDINGS: Stable cardiomegaly with moderate aortic atherosclerosis. Status post median sternotomy. Diffuse interstitial prominence and peribronchial thickening consistent bronchitic change. No alveolar consolidation. Chronic posterior costophrenic angle blunting that may represent a small effusion or pleural thickening. No acute osseous abnormality. IMPRESSION: Stable cardiomegaly with aortic atherosclerosis. Peribronchial thickening with increased interstitial lung markings suspicious for bronchitic change. Electronically Signed   By: Ashley Royalty M.D.   On: 11/28/2017 20:14   Mr Foot Right Wo Contrast  Result Date: 12/06/2017 CLINICAL DATA:  Right foot ulceration dorsal along the first MTP joint. Purulent collection. Swelling. EXAM: MRI OF THE RIGHT FOREFOOT WITHOUT CONTRAST TECHNIQUE: Multiplanar, multisequence MR imaging of the right forefoot was performed. No intravenous contrast was administered. COMPARISON:  Radiographs from 12/05/2017 FINDINGS: Despite efforts by the technologist and patient, motion artifact is present on today's exam and could not be eliminated. This reduces exam sensitivity and specificity. Bones/Joint/Cartilage There is an unusual pattern with edema signal tracking within all of the phalanges and in the heads of the metatarsals even on inversion recovery weighted images. There is some associated reduced T1 signal especially in the small digit phalanges. Ligaments Lisfranc ligament intact. Muscles and Tendons Diffuse edema tracks along the plantar musculature of the foot Soft tissues Extensive dorsal subcutaneous edema tracking along the foot and into the toes. IMPRESSION: 1. Unusual pattern with low-grade edema signal in all of the phalanges and the heads of the metatarsals. This is more  striking in the small toe. There is also diffuse subcutaneous edema tracking into the toes. He would be unusual for osteomyelitis to involve all of the phalanges of the toes and metatarsal heads at once, and the appearance is more suggestive of a systemic or global phenomenon in the distal foot, possibly edema due to vascular insufficiency or reflex sympathetic dystrophy. There is currently no drainable abscess. Careful surveillance may be warranted. 2. Diffuse edema within along the plantar musculature of the foot, probably neurogenic although myositis is not readily excluded on today's noncontrast exam. I do not see any gas tracking in the soft tissues of the foot. Electronically Signed   By:  Van Clines M.D.   On: 12/06/2017 18:44   Dg Foot Complete Right  Result Date: 12/05/2017 CLINICAL DATA:  Soft tissue wound right foot. EXAM: RIGHT FOOT COMPLETE - 3+ VIEW COMPARISON:  11/28/2017 FINDINGS: Mild degenerate change of the first MTP joint and midfoot. No evidence of bone destruction to suggest osteomyelitis. No air in the soft tissues. IMPRESSION: No acute findings. Electronically Signed   By: Marin Olp M.D.   On: 12/05/2017 19:19   Dg Foot Complete Right  Result Date: 11/28/2017 CLINICAL DATA:  Right foot pain and swelling. EXAM: RIGHT FOOT COMPLETE - 3+ VIEW COMPARISON:  None. FINDINGS: There is no evidence of fracture or dislocation. No focal bone erosions. Mild hallux valgus deformity with degenerative changes at the first MTP joint. Soft tissues are unremarkable. IMPRESSION: No acute bone abnormalities. Electronically Signed   By: Kerby Moors M.D.   On: 11/28/2017 17:38    Author:  Berle Mull, MD [facility] Pager: [phone] 12/11/2017 4:17 PM     If 7PM-7AM, please contact night-coverage [email or web address] Password [email or web address] 12/11/2017, 4:17 PM   LOS: 6 days

## 2017-12-12 ENCOUNTER — Encounter (HOSPITAL_COMMUNITY): Payer: Self-pay | Admitting: Certified Registered"

## 2017-12-12 ENCOUNTER — Encounter (HOSPITAL_COMMUNITY)
Admission: EM | Disposition: A | Discharge status: Home Health | Payer: Self-pay | Source: Home / Self Care | Attending: Internal Medicine

## 2017-12-12 ENCOUNTER — Inpatient Hospital Stay (HOSPITAL_COMMUNITY): Payer: Medicare Other | Admitting: Anesthesiology

## 2017-12-12 HISTORY — PX: AMPUTATION: SHX166

## 2017-12-12 LAB — COMPREHENSIVE METABOLIC PANEL
ALBUMIN: 2.7 g/dL — AB (ref 3.5–5.0)
ALK PHOS: 213 U/L — AB (ref 38–126)
ALT: 184 U/L — ABNORMAL HIGH (ref 17–63)
ANION GAP: 11 (ref 5–15)
AST: 312 U/L — ABNORMAL HIGH (ref 15–41)
BUN: 22 mg/dL — ABNORMAL HIGH (ref 6–20)
CALCIUM: 8.3 mg/dL — AB (ref 8.9–10.3)
CHLORIDE: 102 mmol/L (ref 101–111)
CO2: 22 mmol/L (ref 22–32)
Creatinine, Ser: 1.34 mg/dL — ABNORMAL HIGH (ref 0.61–1.24)
GFR calc non Af Amer: 50 mL/min — ABNORMAL LOW (ref >60)
GFR, EST AFRICAN AMERICAN: 58 mL/min — AB (ref >60)
GLUCOSE: 185 mg/dL — AB (ref 65–99)
POTASSIUM: 4 mmol/L (ref 3.5–5.1)
SODIUM: 135 mmol/L (ref 135–145)
Total Bilirubin: 2.4 mg/dL — ABNORMAL HIGH (ref 0.3–1.2)
Total Protein: 6.3 g/dL — ABNORMAL LOW (ref 6.5–8.1)

## 2017-12-12 LAB — BASIC METABOLIC PANEL
ANION GAP: 11 (ref 5–15)
BUN: 19 mg/dL (ref 6–20)
CHLORIDE: 97 mmol/L — AB (ref 101–111)
CO2: 25 mmol/L (ref 22–32)
Calcium: 8.1 mg/dL — ABNORMAL LOW (ref 8.9–10.3)
Creatinine, Ser: 1.07 mg/dL (ref 0.61–1.24)
GFR calc Af Amer: >60 mL/min (ref >60)
GLUCOSE: 172 mg/dL — AB (ref 65–99)
POTASSIUM: 3.9 mmol/L (ref 3.5–5.1)
Sodium: 133 mmol/L — ABNORMAL LOW (ref 135–145)

## 2017-12-12 LAB — CBC WITH DIFFERENTIAL/PLATELET
BASOS PCT: 0 %
BASOS PCT: 0 %
Basophils Absolute: 0 10*3/uL (ref 0.0–0.1)
Basophils Absolute: 0 10*3/uL (ref 0.0–0.1)
EOS ABS: 0.3 10*3/uL (ref 0.0–0.7)
EOS ABS: 0.2 10*3/uL (ref 0.0–0.7)
EOS PCT: 1 %
Eosinophils Relative: 3 %
HCT: 28 % — ABNORMAL LOW (ref 39.0–52.0)
HEMATOCRIT: 27.8 % — AB (ref 39.0–52.0)
Hemoglobin: 8.8 g/dL — ABNORMAL LOW (ref 13.0–17.0)
Hemoglobin: 8.7 g/dL — ABNORMAL LOW (ref 13.0–17.0)
LYMPHS ABS: 2.7 10*3/uL (ref 0.7–4.0)
Lymphocytes Relative: 16 %
Lymphocytes Relative: 17 %
Lymphs Abs: 1.8 10*3/uL (ref 0.7–4.0)
MCH: 30.2 pg (ref 26.0–34.0)
MCH: 29.5 pg (ref 26.0–34.0)
MCHC: 31.7 g/dL (ref 30.0–36.0)
MCHC: 31.1 g/dL (ref 30.0–36.0)
MCV: 95.5 fL (ref 78.0–100.0)
MCV: 94.9 fL (ref 78.0–100.0)
MONO ABS: 0.6 10*3/uL (ref 0.1–1.0)
MONO ABS: 0.4 10*3/uL (ref 0.1–1.0)
MONOS PCT: 6 %
MONOS PCT: 3 %
NEUTROS PCT: 79 %
Neutro Abs: 8.5 10*3/uL — ABNORMAL HIGH (ref 1.7–7.7)
Neutro Abs: 12.1 10*3/uL — ABNORMAL HIGH (ref 1.7–7.7)
Neutrophils Relative %: 75 %
PLATELETS: 255 10*3/uL (ref 150–400)
PLATELETS: 277 10*3/uL (ref 150–400)
RBC: 2.91 MIL/uL — ABNORMAL LOW (ref 4.22–5.81)
RBC: 2.95 MIL/uL — ABNORMAL LOW (ref 4.22–5.81)
RDW: 13.7 % (ref 11.5–15.5)
RDW: 13.5 % (ref 11.5–15.5)
WBC: 11.3 10*3/uL — ABNORMAL HIGH (ref 4.0–10.5)
WBC: 15.5 10*3/uL — ABNORMAL HIGH (ref 4.0–10.5)

## 2017-12-12 LAB — GLUCOSE, CAPILLARY
GLUCOSE-CAPILLARY: 184 mg/dL — AB (ref 65–99)
GLUCOSE-CAPILLARY: 174 mg/dL — AB (ref 65–99)
Glucose-Capillary: 157 mg/dL — ABNORMAL HIGH (ref 65–99)
Glucose-Capillary: 149 mg/dL — ABNORMAL HIGH (ref 65–99)
Glucose-Capillary: 138 mg/dL — ABNORMAL HIGH (ref 65–99)

## 2017-12-12 LAB — PROTIME-INR
INR: 1.16
PROTHROMBIN TIME: 14.7 s (ref 11.4–15.2)

## 2017-12-12 SURGERY — AMPUTATION, ABOVE KNEE
Anesthesia: General | Laterality: Right

## 2017-12-12 MED ORDER — PHENYLEPHRINE 40 MCG/ML (10ML) SYRINGE FOR IV PUSH (FOR BLOOD PRESSURE SUPPORT)
PREFILLED_SYRINGE | INTRAVENOUS | Status: AC
  Filled 2017-12-12: qty 10

## 2017-12-12 MED ORDER — LIDOCAINE 2% (20 MG/ML) 5 ML SYRINGE
INTRAMUSCULAR | Status: DC | PRN
  Administered 2017-12-12: 40 mg via INTRAVENOUS

## 2017-12-12 MED ORDER — DEXAMETHASONE SODIUM PHOSPHATE 10 MG/ML IJ SOLN
INTRAMUSCULAR | Status: DC | PRN
  Administered 2017-12-12: 10 mg via INTRAVENOUS

## 2017-12-12 MED ORDER — LIDOCAINE 2% (20 MG/ML) 5 ML SYRINGE
INTRAMUSCULAR | Status: AC
  Filled 2017-12-12: qty 5

## 2017-12-12 MED ORDER — 0.9 % SODIUM CHLORIDE (POUR BTL) OPTIME
TOPICAL | Status: DC | PRN
  Administered 2017-12-12: 1000 mL

## 2017-12-12 MED ORDER — ACETAMINOPHEN 325 MG PO TABS
325.0000 mg | ORAL_TABLET | ORAL | Status: DC | PRN
  Administered 2017-12-13 – 2017-12-18 (×4): 650 mg via ORAL
  Filled 2017-12-12 (×5): qty 2

## 2017-12-12 MED ORDER — ONDANSETRON HCL 4 MG/2ML IJ SOLN
INTRAMUSCULAR | Status: AC
  Filled 2017-12-12: qty 2

## 2017-12-12 MED ORDER — FENTANYL CITRATE (PF) 100 MCG/2ML IJ SOLN
25.0000 ug | INTRAMUSCULAR | Status: DC | PRN
  Administered 2017-12-12 (×2): 50 ug via INTRAVENOUS

## 2017-12-12 MED ORDER — CEFAZOLIN SODIUM-DEXTROSE 1-4 GM/50ML-% IV SOLN
INTRAVENOUS | Status: DC | PRN
  Administered 2017-12-12: 1 g via INTRAVENOUS

## 2017-12-12 MED ORDER — DEXAMETHASONE SODIUM PHOSPHATE 10 MG/ML IJ SOLN
INTRAMUSCULAR | Status: AC
  Filled 2017-12-12: qty 1

## 2017-12-12 MED ORDER — ACETAMINOPHEN 650 MG RE SUPP: 325.0000 mg | RECTAL | Status: DC | PRN

## 2017-12-12 MED ORDER — MIDAZOLAM HCL 2 MG/2ML IJ SOLN
INTRAMUSCULAR | Status: AC
  Filled 2017-12-12: qty 2

## 2017-12-12 MED ORDER — LACTATED RINGERS IV SOLN
INTRAVENOUS | Status: DC | PRN
  Administered 2017-12-12: 16:00:00 via INTRAVENOUS

## 2017-12-12 MED ORDER — FENTANYL CITRATE (PF) 250 MCG/5ML IJ SOLN
INTRAMUSCULAR | Status: AC
  Filled 2017-12-12: qty 5

## 2017-12-12 MED ORDER — ROPIVACAINE HCL 7.5 MG/ML IJ SOLN
INTRAMUSCULAR | Status: DC | PRN
  Administered 2017-12-12: 20 mL via PERINEURAL

## 2017-12-12 MED ORDER — ONDANSETRON HCL 4 MG/2ML IJ SOLN: 4.0000 mg | Freq: Once | INTRAMUSCULAR | Status: DC | PRN

## 2017-12-12 MED ORDER — ACETAMINOPHEN 325 MG PO TABS
650.0000 mg | ORAL_TABLET | Freq: Once | ORAL | Status: AC
  Administered 2017-12-12: 650 mg via ORAL
  Filled 2017-12-12: qty 2

## 2017-12-12 MED ORDER — LORAZEPAM 2 MG/ML IJ SOLN: 0.5000 mg | Freq: Once | INTRAMUSCULAR | Status: DC | PRN

## 2017-12-12 MED ORDER — PHENYLEPHRINE 40 MCG/ML (10ML) SYRINGE FOR IV PUSH (FOR BLOOD PRESSURE SUPPORT)
PREFILLED_SYRINGE | INTRAVENOUS | Status: DC | PRN
  Administered 2017-12-12: 120 ug via INTRAVENOUS
  Administered 2017-12-12: 80 ug via INTRAVENOUS
  Administered 2017-12-12: 120 ug via INTRAVENOUS
  Administered 2017-12-12: 80 ug via INTRAVENOUS

## 2017-12-12 MED ORDER — ONDANSETRON HCL 4 MG/2ML IJ SOLN
INTRAMUSCULAR | Status: DC | PRN
  Administered 2017-12-12: 4 mg via INTRAVENOUS

## 2017-12-12 MED ORDER — ONDANSETRON HCL 4 MG/2ML IJ SOLN: 4.0000 mg | Freq: Four times a day (QID) | INTRAMUSCULAR | Status: DC | PRN

## 2017-12-12 MED ORDER — FENTANYL CITRATE (PF) 100 MCG/2ML IJ SOLN
INTRAMUSCULAR | Status: AC
  Administered 2017-12-12: 25 ug
  Filled 2017-12-12: qty 2

## 2017-12-12 MED ORDER — CEFAZOLIN SODIUM 1 G IJ SOLR
INTRAMUSCULAR | Status: AC
  Filled 2017-12-12: qty 10

## 2017-12-12 MED ORDER — PROPOFOL 10 MG/ML IV BOLUS
INTRAVENOUS | Status: DC | PRN
  Administered 2017-12-12: 80 mg via INTRAVENOUS

## 2017-12-12 MED ORDER — SODIUM CHLORIDE 0.9 % IV SOLN
INTRAVENOUS | Status: DC
  Administered 2017-12-12: 50 mL/h via INTRAVENOUS

## 2017-12-12 MED ORDER — FENTANYL CITRATE (PF) 100 MCG/2ML IJ SOLN
INTRAMUSCULAR | Status: AC
Start: 2017-12-12 — End: 2017-12-13
  Filled 2017-12-12: qty 2

## 2017-12-12 MED ORDER — PROPOFOL 10 MG/ML IV BOLUS
INTRAVENOUS | Status: AC
  Filled 2017-12-12: qty 20

## 2017-12-12 MED ORDER — HEPARIN SODIUM (PORCINE) 5000 UNIT/ML IJ SOLN
5000.0000 [IU] | Freq: Three times a day (TID) | INTRAMUSCULAR | Status: DC
  Administered 2017-12-13 – 2017-12-19 (×19): 5000 [IU] via SUBCUTANEOUS
  Filled 2017-12-12 (×20): qty 1

## 2017-12-12 SURGICAL SUPPLY — 48 items
BANDAGE ACE 4X5 VEL STRL LF (GAUZE/BANDAGES/DRESSINGS) ×3 IMPLANT
BANDAGE ACE 6X5 VEL STRL LF (GAUZE/BANDAGES/DRESSINGS) ×3 IMPLANT
BANDAGE ELASTIC 6 VELCRO ST LF (GAUZE/BANDAGES/DRESSINGS) ×2 IMPLANT
BLADE SAW GIGLI 510 (BLADE) ×2 IMPLANT
BLADE SAW GIGLI 510MM (BLADE) ×1
BNDG COHESIVE 6X5 TAN STRL LF (GAUZE/BANDAGES/DRESSINGS) ×3 IMPLANT
BNDG GAUZE ELAST 4 BULKY (GAUZE/BANDAGES/DRESSINGS) ×4 IMPLANT
CANISTER SUCT 3000ML PPV (MISCELLANEOUS) ×3 IMPLANT
CLIP VESOCCLUDE MED 6/CT (CLIP) ×3 IMPLANT
COVER SURGICAL LIGHT HANDLE (MISCELLANEOUS) ×3 IMPLANT
DRAIN CHANNEL 19F RND (DRAIN) IMPLANT
DRAPE HALF SHEET 40X57 (DRAPES) ×3 IMPLANT
DRAPE ORTHO SPLIT 77X108 STRL (DRAPES) ×6
DRAPE SURG ORHT 6 SPLT 77X108 (DRAPES) ×2 IMPLANT
DRSG ADAPTIC 3X8 NADH LF (GAUZE/BANDAGES/DRESSINGS) ×3 IMPLANT
ELECT CAUTERY BLADE 6.4 (BLADE) ×3 IMPLANT
ELECT REM PT RETURN 9FT ADLT (ELECTROSURGICAL) ×3
ELECTRODE REM PT RTRN 9FT ADLT (ELECTROSURGICAL) ×1 IMPLANT
EVACUATOR SILICONE 100CC (DRAIN) IMPLANT
GAUZE SPONGE 4X4 12PLY STRL (GAUZE/BANDAGES/DRESSINGS) ×4 IMPLANT
GAUZE SPONGE 4X4 16PLY XRAY LF (GAUZE/BANDAGES/DRESSINGS) ×3 IMPLANT
GLOVE BIOGEL PI IND STRL 7.5 (GLOVE) ×1 IMPLANT
GLOVE BIOGEL PI INDICATOR 7.5 (GLOVE) ×2
GLOVE SURG SS PI 7.5 STRL IVOR (GLOVE) ×3 IMPLANT
GOWN STRL REUS W/ TWL LRG LVL3 (GOWN DISPOSABLE) ×2 IMPLANT
GOWN STRL REUS W/ TWL XL LVL3 (GOWN DISPOSABLE) ×1 IMPLANT
GOWN STRL REUS W/TWL LRG LVL3 (GOWN DISPOSABLE) ×6
GOWN STRL REUS W/TWL XL LVL3 (GOWN DISPOSABLE) ×3
KIT BASIN OR (CUSTOM PROCEDURE TRAY) ×3 IMPLANT
KIT ROOM TURNOVER OR (KITS) ×3 IMPLANT
NS IRRIG 1000ML POUR BTL (IV SOLUTION) ×3 IMPLANT
PACK GENERAL/GYN (CUSTOM PROCEDURE TRAY) ×3 IMPLANT
PAD ARMBOARD 7.5X6 YLW CONV (MISCELLANEOUS) ×6 IMPLANT
STAPLER VISISTAT 35W (STAPLE) ×3 IMPLANT
STOCKINETTE IMPERVIOUS LG (DRAPES) ×3 IMPLANT
SUT ETHILON 3 0 PS 1 (SUTURE) IMPLANT
SUT SILK 0 TIES 10X30 (SUTURE) ×3 IMPLANT
SUT SILK 2 0 (SUTURE)
SUT SILK 2 0 SH CR/8 (SUTURE) ×2 IMPLANT
SUT SILK 2-0 18XBRD TIE 12 (SUTURE) IMPLANT
SUT SILK 3 0 (SUTURE)
SUT SILK 3-0 18XBRD TIE 12 (SUTURE) IMPLANT
SUT VIC AB 2-0 CT1 18 (SUTURE) ×8 IMPLANT
SUT VIC AB 4-0 PS2 27 (SUTURE) ×4 IMPLANT
TAPE UMBILICAL COTTON 1/8X30 (MISCELLANEOUS) IMPLANT
TOWEL GREEN STERILE (TOWEL DISPOSABLE) ×3 IMPLANT
UNDERPAD 30X30 (UNDERPADS AND DIAPERS) ×3 IMPLANT
WATER STERILE IRR 1000ML POUR (IV SOLUTION) ×3 IMPLANT

## 2017-12-12 NOTE — Transfer of Care (Signed)
Immediate Anesthesia Transfer of Care Note  Patient: Marcus Hendrix  Procedure(s) Performed: AMPUTATION ABOVE KNEE RIGHT (Right )  Patient Location: PACU  Anesthesia Type:General  Level of Consciousness: drowsy and patient cooperative  Airway & Oxygen Therapy: Patient Spontanous Breathing and Patient connected to nasal cannula oxygen  Post-op Assessment: Report given to RN  Post vital signs: Reviewed and stable  Last Vitals:  Vitals:   12/12/17 1414 12/12/17 1537  BP: (!) 154/66   Pulse: 84   Resp:    Temp: (!) 38.4 C 37.3 C  SpO2: 96%     Last Pain:  Vitals:   12/12/17 1537  TempSrc: Oral  PainSc:       Patients Stated Pain Goal: 0 (94/32/00 3794)  Complications: No apparent anesthesia complications

## 2017-12-12 NOTE — Progress Notes (Addendum)
Nutrition Follow Up  DOCUMENTATION CODES:   Not applicable  INTERVENTION:    Continue Glucerna Shake po TID, each supplement provides 220 kcal and 10 grams of protein  Continue Prostat liquid protein po 30 ml BID with meals, each supplement provides 100 kcal, 15 grams protein  NEW NUTRITION DIAGNOSIS:   Increased nutrient needs related to wound healing as evidenced by estimated nutrition needs Ongoing  GOAL:   Patient will meet greater than or equal to 90% of their needs  Progressing  MONITOR:   PO intake, Supplement acceptance, Labs, Weight trends  ASSESSMENT:  76 y/o male PMHx cerebrovascular disease, DM2, CAD s/ CABG, HF, MI, HLD/HTN, PVD. Presents from PCP appointment due to LLE wound. Has had decrease appetite and sleeping poorly due to wound associated discomfort. Admitted for management of diabetic foot infection. RD consulted for wound healing.   RD visited with pt today.  States his appetite is good. PO intake 75% per flowsheet records. Currently NPO for surgery today. He is proceeding with R AKA.  Also reports he is drinking his [place]. Labs and medications reviewed. CBG's [id number].  Nutrition focused physical exam completed.   No muscle or subcutaneous fat depletion noticed.  Diet Order:  Diet NPO time specified Except for: Sips with Meds  EDUCATION NEEDS:   No education needs have been identified at this time  Skin: Wound to R foot (ischemic vs diabetic)    Last BM:  12/26  Height:   Ht Readings from Last 1 Encounters:  12/06/17 5\' 6"  (1.676 m)   Weight:   Wt Readings from Last 1 Encounters:  12/06/17 181 lb 3.5 oz (82.2 kg)   Ideal Body Weight:  64.55 kg  BMI:  Body mass index is 29.25 kg/m.  Estimated Nutritional Needs:   Kcal:  1900-2100   Protein:  80-95 gm   Fluid:  1.9-2.1 L  Arthur Holms, RD, LDN Pager #: [phone] After-Hours Pager #: [phone]

## 2017-12-12 NOTE — Op Note (Signed)
    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  He underwent angiography and had iliac stenting.  He had no named vessel below his knee and minimal opacification of arteries across the ankle.  He has wet gangrene to the fourth and fifth toes and ischemic changes to his great toe.  After discussing with the patient I feel the best option is an above-knee amputation.  He is in agreement and wishes to proceed.  Procedure:  The patient was identified in the holding area and taken to [place] 11  The patient was then placed supine on the table. general anesthesia was administered.  The patient was prepped and draped in the usual sterile fashion.  A time out was called and antibiotics were administered.  A fishmouth incision was made just above the knee.  Cautery was used to divide subcutaneous tissue down to the fascia which was opened with cautery.  Circumferential exposure of the femur was obtained.  A periosteal elevator was used to elevate the periosteum.  A Gigli saw was used to transect the femur, beveling the anterior surface.  I then identified the neurovascular bundle.  These were divided between clamps.  Cautery was then used to divide the remaining muscle and tissue and the leg was removed as a specimen.  I then individually dissected out the neurovascular bundle and individually ligated the artery nerve and vein with 2-0 silk ties, proximal to the cut edge of the femur.  A rasp was then used to smooth the bone edge.  The wound was then  copiously irrigated.  Hemostasis was achieved.  The fascia was then reapproximated with interrupted 3-0 Vicryl suture.  The skin incision was closed with 4-0 Vicryl.  Steri-Strips and sterile dressings were applied.  There were no immediate complications.   Disposition: To PACU stable   V. Annamarie Major, M.D. [facility] [place] Office: [phone] Pager:  [phone]

## 2017-12-12 NOTE — Code Documentation (Signed)
CODE BLUE NOTE  Patient Name: Marcus Hendrix   MRN: [id number]   Date of Birth/ Sex: 06/02/1941 , male      Admission Date: 12/05/2017  Attending Provider: Velvet Bathe, MD  Primary Diagnosis: Diabetic foot infection [facility])    Indication: Pt was in his usual state of health until this PM at 6:50pm, when he was noted to be un-responssive. Patient was found to be pulseless. Code blue was subsequently called. At the time of arrival on scene, ACLS protocol was underway.    Technical Description:  - CPR performance duration:  2 minute  - Was defibrillation or cardioversion used? No   - Was external pacer placed? No  - Was patient intubated pre/post CPR? No    Medications Administered: Y = Yes; Blank = No Amiodarone    Atropine    Calcium    Epinephrine    Lidocaine    Magnesium    Norepinephrine    Phenylephrine    Sodium bicarbonate    Vasopressin      Post CPR evaluation:  - Final Status - Was patient successfully resuscitated ? Yes - What is current rhythm? Sinus  - What is current hemodynamic status? On room air, responsive, talking    Miscellaneous Information:  - Labs sent, including: CBC, BMP, troponins, EKG   - Primary team notified?  No  - Family Notified? Yes  - Additional notes/ transfer status: Transferred to ICU        Caroline More, DO  12/12/2017, 7:15 PM

## 2017-12-12 NOTE — Anesthesia Procedure Notes (Signed)
Procedure Name: LMA Insertion Date/Time: 12/12/2017 4:28 PM Performed by: Barrington Ellison, CRNA Pre-anesthesia Checklist: Patient identified, Emergency Drugs available, Suction available and Patient being monitored Patient Re-evaluated:Patient Re-evaluated prior to induction Oxygen Delivery Method: Circle System Utilized Preoxygenation: Pre-oxygenation with 100% oxygen Induction Type: IV induction Ventilation: Mask ventilation without difficulty LMA: LMA inserted LMA Size: 4.0 Number of attempts: 1 Placement Confirmation: positive ETCO2 Tube secured with: Tape Dental Injury: Teeth and Oropharynx as per pre-operative assessment

## 2017-12-12 NOTE — Progress Notes (Addendum)
   Daily Progress Note   Reportedly patient transfered to floor from PACU and coded shortly after arrival.  Few cycles of CPR were needed reportedly but patient rapidly regained pulse and blood pressure without ACLS.  - Pt s/p R AKA unclear if this is post anesthetic event - Post-code labs ordered: CBC, BMP, troponin, EKG   CBC Latest Ref Rng & Units 12/12/2017 12/12/2017 12/11/2017  WBC 4.0 - 10.5 K/uL 15.5(H) 11.3(H) 11.5(H)  Hemoglobin 13.0 - 17.0 g/dL 8.7(L) 8.8(L) 9.2(L)  Hematocrit 39.0 - 52.0 % 28.0(L) 27.8(L) 28.8(L)  Platelets 150 - 400 K/uL 277 255 233   - Hold narcotics until pt's mental status improved, currently pt perseverating - Attempted to contact spouse at [place], MD, [facility] [place]: [phone] Pager: [phone]  12/12/2017, 7:51 PM

## 2017-12-12 NOTE — Progress Notes (Signed)
  Progress Note    12/12/2017 7:52 AM 4 Days Post-Op  Subjective: Persistent rest pain   Vitals:   12/11/17 2055 12/12/17 0418  BP: (!) 146/69 (!) 149/58  Pulse:    Resp: 17 15  Temp: 100 F (37.8 C) 99.7 F (37.6 C)  SpO2: 95% 94%   Physical Exam: Extremities:  Open wounds R foot Abdomen:  Soft Neurologic: A&O  CBC    Component Value Date/Time   WBC 11.5 (H) 12/11/2017 0340   RBC 3.05 (L) 12/11/2017 0340   HGB 9.2 (L) 12/11/2017 0340   HCT 28.8 (L) 12/11/2017 0340   PLT 233 12/11/2017 0340   MCV 94.4 12/11/2017 0340   MCH 30.2 12/11/2017 0340   MCHC 31.9 12/11/2017 0340   RDW 13.6 12/11/2017 0340   LYMPHSABS 1.6 12/11/2017 0340   MONOABS 1.3 (H) 12/11/2017 0340   EOSABS 0.1 12/11/2017 0340   BASOSABS 0.0 12/11/2017 0340    BMET    Component Value Date/Time   NA 133 (L) 12/11/2017 0340   K 3.8 12/11/2017 0340   CL 102 12/11/2017 0340   CO2 24 12/11/2017 0340   GLUCOSE 154 (H) 12/11/2017 0340   BUN 13 12/11/2017 0340   CREATININE 1.02 12/11/2017 0340   CALCIUM 8.2 (L) 12/11/2017 0340   GFRNONAA >60 12/11/2017 0340   GFRAA >60 12/11/2017 0340    INR    Component Value Date/Time   INR 1.21 12/08/2017 0706     Intake/Output Summary (Last 24 hours) at 12/12/2017 0752 Last data filed at 12/12/2017 0421 Gross per 24 hour  Intake 480 ml  Output 630 ml  Net -150 ml     Assessment/Plan:  76 y.o. male is s/p aortogram with B CIA VBX stent graft and R EIA stent 4 Days Post-Op   Patient would like to proceed with R AKA He is aware that surgery scheduled today may have to be postponed due to a busy OR schedule Keep NPO Consent to be signed with nursing staff   Marcus Ligas, PA-C [facility] [phone] 12/12/2017 7:52 AM

## 2017-12-12 NOTE — Progress Notes (Signed)
Patient arrived to 4e10 at 1850. Transported on cardiac monitor and communicating with nurse and NT in hallway. Attempting to hook patient to monitor in room 4e10 when patient began sweating profusely and was unresponsive. Eyes wide open with no response to commands. NO carotid pulses felt by RN. NT immediately pulled code button and began compressions on chest. Shortly after AED pads placed on chest and compressions were being given patient spontaneously awakened and was breathing on his own. Breaths supported on ambu bag. 02 sat 100%. Code team arrived and patient responsive stating he " just fell out." Rapid response, code team, and AC at bedside. Patient will have critical care consult and be transferred to 12m. Dr  Donzetta Matters paged to notify him of transfer to 60m.

## 2017-12-12 NOTE — Anesthesia Procedure Notes (Signed)
Anesthesia Regional Block: Femoral nerve block   Pre-Anesthetic Checklist: ,, timeout performed, Correct Patient, Correct Site, Correct Laterality, Correct Procedure, Correct Position, site marked, Risks and benefits discussed,  Surgical consent,  Pre-op evaluation,  At surgeon's request and post-op pain management  Laterality: Right  Prep: chloraprep       Needles:  Injection technique: Single-shot  Needle Type: Echogenic Needle     Needle Length: 9cm  Needle Gauge: 21     Additional Needles:   Narrative:  Start time: 12/12/2017 3:56 PM End time: 12/12/2017 4:00 PM Injection made incrementally with aspirations every 5 mL.  Performed by: Personally  Anesthesiologist: Audry Pili, MD  Additional Notes: No pain on injection. No increased resistance to injection. Injection made in 5cc increments. Good needle visualization. Patient tolerated the procedure well.

## 2017-12-12 NOTE — Anesthesia Preprocedure Evaluation (Addendum)
Anesthesia Evaluation  Patient identified by MRN, date of birth, ID band Patient awake    Reviewed: Allergy & Precautions, NPO status , Patient's Chart, lab work & pertinent test results, reviewed documented beta blocker date and time   Airway Mallampati: II  TM Distance: >3 FB Neck ROM: Full    Dental  (+) Edentulous Upper, Partial Lower, Poor Dentition   Pulmonary former smoker,    Pulmonary exam normal breath sounds clear to auscultation       Cardiovascular hypertension, Pt. on home beta blockers and Pt. on medications + angina + CAD, + Past MI, + Peripheral Vascular Disease and +CHF  Normal cardiovascular exam Rhythm:Regular Rate:Normal  TTE 11/2017 - Mild LVH. EF was 55%. Hypokinesis of the   basalinferior myocardium. Grade 2 diastolic dysfunction. MV prolapse, involving the posterior leaflet. There was probable moderate regurgitation directed eccentrically and anteriorly - difficult to assess degree accurately. Mildly dilated LA and RA. Trivial TR. PASP 40 mm Hg (S).  Carotid US 2016 - 40-59% right ICAS, 1-39% left ICAS   Neuro/Psych negative neurological ROS  negative psych ROS   GI/Hepatic negative GI ROS, Neg liver ROS, neg GERD  ,  Endo/Other  diabetes, Type 2, Insulin Dependent  Renal/GU negative Renal ROS  negative genitourinary   Musculoskeletal negative musculoskeletal ROS (+)   Abdominal   Peds  Hematology  (+) anemia ,   Anesthesia Other Findings   Reproductive/Obstetrics                           Anesthesia Physical Anesthesia Plan  ASA: III  Anesthesia Plan: General   Post-op Pain Management:  Regional for Post-op pain   Induction: Intravenous  PONV Risk Score and Plan: 2 and Treatment may vary due to age or medical condition, Ondansetron and Dexamethasone  Airway Management Planned: LMA  Additional Equipment: None  Intra-op Plan:   Post-operative Plan:  Extubation in OR  Informed Consent: I have reviewed the patients History and Physical, chart, labs and discussed the procedure including the risks, benefits and alternatives for the proposed anesthesia with the patient or authorized representative who has indicated his/her understanding and acceptance.   Dental advisory given  Plan Discussed with: CRNA  Anesthesia Plan Comments:         Anesthesia Quick Evaluation

## 2017-12-12 NOTE — Progress Notes (Signed)
PROGRESS NOTE  Marcus Hendrix MCN:[id number] DOB: Apr 25, 1941 DOA: 12/05/2017 PCP: [name], MD  Brief History:  76 year old male with a history of diabetes mellitus, coronary artery disease, hyperlipidemia, hypertension, peripheral vascular disease, presenting with approximately 3-week history of right foo swelling, and drainage.  Patient does not recall any recent injury.  The patient was actually admitted to the hospital from November 28, 2017 through November 29, 2017 for chest pain during which time the patient was also treated for cellulitis of the right lower extremity.  He was discharged home with Augmentin and endorsed compliance with the medication.  He went to see his primary care provider on December 05, 2017 who noted increasing pain, edema and erythema.  As result, the patient was sent to the emergency department for further evaluation.  He states that right foot pain has been his major complaint for the past week.  He denies any fevers, chills, chest pain, shortness breath, coughing, hemoptysis, nausea, vomiting, diarrhea.  At the time of admission, WBC was 7.9, and the patient was afebrile hemodynamically stable.  Upon evaluation on the morning of December 06, 2017, the patient's right foot was noted to be cool to the touch.  In addition, the patient stated that for the past week he has had 2 dangle his right lower extremity to gravity to get relief of his pain.  He has also noted some claudication type symptoms.  Assessment/Plan: Ischemia to the right foot - Patient's symptoms show more PVD as a primary cause of presentation with possible critical limb ischemia. - Initially started on on IV heparin after discussion with Dr. Trula Slade, now subcu DVT prophylaxis dose. - Pain control with Percocet and gabapentin - Underwent aortogram on 12/08/2017, shows bilateral common iliac stenosis, right external iliac stenosis with severe right tibial, SFA, popliteal disease. -  Vascular surgery recommended right AKA Per surgeons notes: 76 y.o. male is s/p aortogram with B CIA VBX stent graft and R EIA stent 4 Days Post-Op  Diabetic foot ulcer - will not start antibiotics--afebrile, hemodynamically stable, no leukocytosis - wound care consult - MRI foot negative for any osteomyelitis, shows edema and insufficiency due to poor vasculature. 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  Most likely will need SNF.  Family Communication:  no Family at bedside  Consultants:    Code Status:  FULL / DNR  DVT Prophylaxis: Subcu heparin   Procedures: As Listed in Progress Note Above  Antibiotics: Ceftriaxone   Subjective: Pt has no new complaints.   Objective: Vitals:   12/11/17 2055 12/12/17 0418 12/12/17 1030 12/12/17 1414  BP: (!) 146/69 (!) 149/58 (!) 154/70 (!) 154/66  Pulse:   83 84  Resp: 17 15    Temp: 100 F (37.8 C) 99.7 F (37.6 C)  (!) 101.2 F (38.4 C)  TempSrc: Oral Oral  Oral  SpO2: 95% 94%  96%  Weight:  Height:        Intake/Output Summary (Last 24 hours) at 12/12/2017 1431 Last data filed at 12/12/2017 0421 Gross per 24 hour  Intake -  Output 280 ml  Net -280 ml   Weight change:  Exam:   General:  Pt is alert and awake, not in acute distress  HEENT: No icterus, No thrush, No neck mass,  [place]/AT  Cardiovascular: RRR, S1/S2, no rubs, no gallops  Respiratory: CTA bilaterally, no wheezing, no crackles, no rhonchi  Abdomen: Soft/+BS, non tender, non distended, no guarding  Extremities: 2+LE edema, No lymphangitis, No petechiae, No rashes, no synovitis; see pictures below or right foot       Data Reviewed: I have personally reviewed following labs and imaging studies Basic Metabolic Panel: Recent Labs  Lab 12/08/17 0706 12/08/17 1552 12/09/17 0314 12/10/17 0237 12/11/17 0340 12/12/17 0745  NA 134*  --  134* 135 133* 133*  K 4.1  --  4.0 3.8 3.8 3.9  CL 102  --  104 106 102 97*  CO2 24  --  '23 25 24 25  ' GLUCOSE 95  --  145* 59* 154* 172*  BUN 13  --  '12 10 13 19  ' CREATININE 0.99 1.10 0.93 1.02 1.02 1.07  CALCIUM 8.5*  --  8.1* 8.1* 8.2* 8.1*  MG 2.0  --   --   --   --   --    Liver Function Tests: Recent Labs  Lab 12/05/17 1830  AST 41  ALT 52  ALKPHOS 126  BILITOT 1.2  PROT 7.2  ALBUMIN 3.5   No results for input(s): LIPASE, AMYLASE in the last 168 hours. No results for input(s): AMMONIA in the last 168 hours. Coagulation Profile: Recent Labs  Lab 12/08/17 0706 12/12/17 0745  INR 1.21 1.16   CBC: Recent Labs  Lab 12/08/17 0706 12/08/17 1552 12/09/17 0314 12/10/17 0237 12/11/17 0340 12/12/17 0745  WBC 8.3 8.6 7.9 9.9 11.5* 11.3*  NEUTROABS 4.9  --  5.5 7.2 8.5* 8.5*  HGB 10.0* 10.1* 8.9* 9.3* 9.2* 8.8*  HCT 31.4* 32.3* 28.3* 29.2* 28.8* 27.8*  MCV 94.3 95.3 94.3 95.1 94.4 95.5  PLT 275 264 252 247 233 255   Cardiac Enzymes: No results for input(s): CKTOTAL, CKMB, CKMBINDEX, TROPONINI in the last 168 hours. BNP: Invalid input(s): POCBNP CBG: Recent Labs  Lab 12/11/17 0631 12/11/17 1620 12/11/17 2053 12/12/17 0607 12/12/17 1418  GLUCAP 151* 222* 171* 184* 157*   HbA1C: No results for input(s): HGBA1C in the last 72 hours. Urine analysis:    Component Value Date/Time   COLORURINE YELLOW 11/28/2017 1412   APPEARANCEUR  CLEAR 11/28/2017 1412   LABSPEC 1.020 11/28/2017 1412   PHURINE 5.5 11/28/2017 1412   GLUCOSEU >=500 (A) 11/28/2017 1412   HGBUR NEGATIVE 11/28/2017 1412   HGBUR negative 04/15/2008 1334   BILIRUBINUR NEGATIVE 11/28/2017 1412   KETONESUR NEGATIVE 11/28/2017 1412   PROTEINUR NEGATIVE 11/28/2017 1412   UROBILINOGEN 0.2 07/31/2013 0641   NITRITE NEGATIVE 11/28/2017 1412   LEUKOCYTESUR NEGATIVE 11/28/2017 1412   Sepsis Labs: '@LABRCNTIP' (procalcitonin:4,lacticidven:4) ) Recent Results (from the past 240 hour(s))  MRSA PCR Screening     Status: None   Collection Time: 12/07/17  4:33 PM  Result Value Ref Range Status   MRSA by PCR NEGATIVE NEGATIVE Final    Comment:        The GeneXpert MRSA Assay (FDA approved for NASAL specimens only), is one component of a comprehensive MRSA colonization surveillance program. It  is not intended to diagnose MRSA infection nor to guide or monitor treatment for MRSA infections.      Scheduled Meds: . amLODipine  10 mg Oral Daily  . aspirin EC  81 mg Oral Daily  . atorvastatin  80 mg Oral Daily  . feeding supplement (GLUCERNA SHAKE)  237 mL Oral BID BM  . feeding supplement (PRO-STAT SUGAR FREE 64)  30 mL Oral BID  . furosemide  40 mg Oral Daily  . gabapentin  100 mg Oral TID  . heparin  5,000 Units Subcutaneous Q8H  . insulin aspart  0-5 Units Subcutaneous QHS  . insulin aspart  0-9 Units Subcutaneous TID WC  . insulin glargine  20 Units Subcutaneous q morning - 10a  . lisinopril  20 mg Oral Daily  . metoprolol tartrate  100 mg Oral BID  . polyethylene glycol  17 g Oral Daily  . sodium chloride flush  3 mL Intravenous Q12H  . timolol  1 drop Left Eye q morning - 10a   Continuous Infusions: . sodium chloride    . cefTRIAXone (ROCEPHIN)  IV Stopped (12/11/17 2221)    Procedures/Studies: Dg Chest 2 View  Result Date: 11/28/2017 CLINICAL DATA:  Dyspnea and chest pain with nonproductive cough and wheezing x1 week. EXAM: CHEST  2 VIEW  COMPARISON:  05/22/2016 FINDINGS: Stable cardiomegaly with moderate aortic atherosclerosis. Status post median sternotomy. Diffuse interstitial prominence and peribronchial thickening consistent bronchitic change. No alveolar consolidation. Chronic posterior costophrenic angle blunting that may represent a small effusion or pleural thickening. No acute osseous abnormality. IMPRESSION: Stable cardiomegaly with aortic atherosclerosis. Peribronchial thickening with increased interstitial lung markings suspicious for bronchitic change. Electronically Signed   By: Ashley Royalty M.D.   On: 11/28/2017 20:14   Mr Foot Right Wo Contrast  Result Date: 12/06/2017 CLINICAL DATA:  Right foot ulceration dorsal along the first MTP joint. Purulent collection. Swelling. EXAM: MRI OF THE RIGHT FOREFOOT WITHOUT CONTRAST TECHNIQUE: Multiplanar, multisequence MR imaging of the right forefoot was performed. No intravenous contrast was administered. COMPARISON:  Radiographs from 12/05/2017 FINDINGS: Despite efforts by the technologist and patient, motion artifact is present on today's exam and could not be eliminated. This reduces exam sensitivity and specificity. Bones/Joint/Cartilage There is an unusual pattern with edema signal tracking within all of the phalanges and in the heads of the metatarsals even on inversion recovery weighted images. There is some associated reduced T1 signal especially in the small digit phalanges. Ligaments Lisfranc ligament intact. Muscles and Tendons Diffuse edema tracks along the plantar musculature of the foot Soft tissues Extensive dorsal subcutaneous edema tracking along the foot and into the toes. IMPRESSION: 1. Unusual pattern with low-grade edema signal in all of the phalanges and the heads of the metatarsals. This is more striking in the small toe. There is also diffuse subcutaneous edema tracking into the toes. He would be unusual for osteomyelitis to involve all of the phalanges of the toes  and metatarsal heads at once, and the appearance is more suggestive of a systemic or global phenomenon in the distal foot, possibly edema due to vascular insufficiency or reflex sympathetic dystrophy. There is currently no drainable abscess. Careful surveillance may be warranted. 2. Diffuse edema within along the plantar musculature of the foot, probably neurogenic although myositis is not readily excluded on today's noncontrast exam. I do not see any gas tracking in the soft tissues of the foot. Electronically Signed   By: Van Clines M.D.   On: 12/06/2017 18:44  Dg Foot Complete Right  Result Date: 12/05/2017 CLINICAL DATA:  Soft tissue wound right foot. EXAM: RIGHT FOOT COMPLETE - 3+ VIEW COMPARISON:  11/28/2017 FINDINGS: Mild degenerate change of the first MTP joint and midfoot. No evidence of bone destruction to suggest osteomyelitis. No air in the soft tissues. IMPRESSION: No acute findings. Electronically Signed   By: Marin Olp M.D.   On: 12/05/2017 19:19   Dg Foot Complete Right  Result Date: 11/28/2017 CLINICAL DATA:  Right foot pain and swelling. EXAM: RIGHT FOOT COMPLETE - 3+ VIEW COMPARISON:  None. FINDINGS: There is no evidence of fracture or dislocation. No focal bone erosions. Mild hallux valgus deformity with degenerative changes at the first MTP joint. Soft tissues are unremarkable. IMPRESSION: No acute bone abnormalities. Electronically Signed   By: Kerby Moors M.D.   On: 11/28/2017 17:38    Author:  Velvet Bathe [facility] Pager: [phone] 12/12/2017 2:31 PM     If 7PM-7AM, please contact night-coverage [email or web address] Password [email or web address] 12/12/2017, 2:31 PM   LOS: 7 days

## 2017-12-13 LAB — CBC WITH DIFFERENTIAL/PLATELET
BASOS PCT: 0 %
Basophils Absolute: 0 10*3/uL (ref 0.0–0.1)
Eosinophils Absolute: 0 10*3/uL (ref 0.0–0.7)
Eosinophils Relative: 0 %
HEMATOCRIT: 26.7 % — AB (ref 39.0–52.0)
HEMOGLOBIN: 8.4 g/dL — AB (ref 13.0–17.0)
LYMPHS PCT: 6 %
Lymphs Abs: 0.7 10*3/uL (ref 0.7–4.0)
MCH: 30.1 pg (ref 26.0–34.0)
MCHC: 31.5 g/dL (ref 30.0–36.0)
MCV: 95.7 fL (ref 78.0–100.0)
MONO ABS: 0.1 10*3/uL (ref 0.1–1.0)
MONOS PCT: 1 %
NEUTROS ABS: 10.3 10*3/uL — AB (ref 1.7–7.7)
NEUTROS PCT: 93 %
Platelets: 229 10*3/uL (ref 150–400)
RBC: 2.79 MIL/uL — ABNORMAL LOW (ref 4.22–5.81)
RDW: 13.6 % (ref 11.5–15.5)
WBC: 11.2 10*3/uL — ABNORMAL HIGH (ref 4.0–10.5)

## 2017-12-13 LAB — BASIC METABOLIC PANEL
ANION GAP: 15 (ref 5–15)
BUN: 26 mg/dL — ABNORMAL HIGH (ref 6–20)
CALCIUM: 7.9 mg/dL — AB (ref 8.9–10.3)
CHLORIDE: 100 mmol/L — AB (ref 101–111)
CO2: 19 mmol/L — AB (ref 22–32)
CREATININE: 1.2 mg/dL (ref 0.61–1.24)
GFR calc non Af Amer: 57 mL/min — ABNORMAL LOW (ref >60)
GLUCOSE: 297 mg/dL — AB (ref 65–99)
Potassium: 4.6 mmol/L (ref 3.5–5.1)
Sodium: 134 mmol/L — ABNORMAL LOW (ref 135–145)

## 2017-12-13 LAB — GLUCOSE, CAPILLARY
GLUCOSE-CAPILLARY: 382 mg/dL — AB (ref 65–99)
GLUCOSE-CAPILLARY: 389 mg/dL — AB (ref 65–99)
Glucose-Capillary: 392 mg/dL — ABNORMAL HIGH (ref 65–99)
Glucose-Capillary: 340 mg/dL — ABNORMAL HIGH (ref 65–99)

## 2017-12-13 MED ORDER — INSULIN GLARGINE 100 UNIT/ML ~~LOC~~ SOLN
25.0000 [IU] | Freq: Every morning | SUBCUTANEOUS | Status: DC
  Administered 2017-12-14 – 2017-12-17 (×4): 25 [IU] via SUBCUTANEOUS
  Filled 2017-12-13 (×4): qty 0.25

## 2017-12-13 NOTE — Progress Notes (Signed)
   Daily Progress Note   Assessment/Planning:   POD #1 s/p R AKA   No events overnight  Pain controlled  Change R AKA dressing tomorrow   Subjective  - 1 Day Post-Op   Pain controlled, no further events overnight   Objective   Vitals:   12/13/17 0600 12/13/17 0700 12/13/17 0800 12/13/17 0841  BP: 134/71 115/77 132/78   Pulse: (!) 125 (!) 124 66   Resp: (!) 24 20 16    Temp:    98.6 F (37 C)  TempSrc:    Oral  SpO2: 98% 99% 98%   Weight:      Height:         Intake/Output Summary (Last 24 hours) at 12/13/2017 0858 Last data filed at 12/13/2017 0800 Gross per 24 hour  Intake 1480 ml  Output 850 ml  Net 630 ml    VASC R AKA bandage dry, no active bleeding    Laboratory   CBC CBC Latest Ref Rng & Units 12/13/2017 12/12/2017 12/12/2017  WBC 4.0 - 10.5 K/uL 11.2(H) 15.5(H) 11.3(H)  Hemoglobin 13.0 - 17.0 g/dL 8.4(L) 8.7(L) 8.8(L)  Hematocrit 39.0 - 52.0 % 26.7(L) 28.0(L) 27.8(L)  Platelets 150 - 400 K/uL 229 277 255    BMET    Component Value Date/Time   NA 134 (L) 12/13/2017 0328   K 4.6 12/13/2017 0328   CL 100 (L) 12/13/2017 0328   CO2 19 (L) 12/13/2017 0328   GLUCOSE 297 (H) 12/13/2017 0328   BUN 26 (H) 12/13/2017 0328   CREATININE 1.20 12/13/2017 0328   CALCIUM 7.9 (L) 12/13/2017 0328   GFRNONAA 57 (L) 12/13/2017 0328   GFRAA >60 12/13/2017 0328     Adele Barthel, MD, FACS [facility] Office: [phone] Pager: [phone]  12/13/2017, 8:58 AM

## 2017-12-13 NOTE — Evaluation (Signed)
Physical Therapy Evaluation Patient Details Name: Marcus Hendrix MRN: [id number] DOB: 09/17/1941 Today's Date: 12/13/2017   History of Present Illness  Pt is a 76 y/o male s/p R transfemoral amputation. PMH including but not limited to DM, HTN, HLD, PVD. Of note, following surgery pt was in his usual state of health until this PM at 6:50pm, when he was noted to be un-responsive. Patient was found to be pulseless. CPR was performed for two minutes and pt revived.    Clinical Impression  Pt presented supine in bed with HOB elevated, awake and willing to participate in therapy session. Prior to admission, pt reported that he ambulated with use of SPC and is independent with ADLs. Pt lives with his spouse and granddson who are available 24/7. Pt currently requires min guard for bed mobility and min-mod A x2 for transfers with use of RW. Pt very pleasant and eager to return to PLOF. Pt would continue to benefit from skilled physical therapy services at this time while admitted and after d/c to address the below listed limitations in order to improve overall safety and independence with functional mobility.     Follow Up Recommendations CIR;Supervision/Assistance - 24 hour    Equipment Recommendations  Rolling walker with 5" wheels;Wheelchair (measurements PT);Wheelchair cushion (measurements PT)    Recommendations for Other Services Rehab consult     Precautions / Restrictions Precautions Precautions: Fall Restrictions Weight Bearing Restrictions: Yes RLE Weight Bearing: Non weight bearing      Mobility  Bed Mobility Overal bed mobility: Needs Assistance Bed Mobility: Supine to Sit     Supine to sit: Min guard;HOB elevated     General bed mobility comments: increased time and effort, min guard for safety  Transfers Overall transfer level: Needs assistance Equipment used: Rolling walker (2 wheeled) Transfers: Sit to/from [facility] Sit to Stand: Min  assist;+2 physical assistance;+2 safety/equipment Stand pivot transfers: Mod assist;+2 physical assistance;+2 safety/equipment       General transfer comment: increased time and effort, cueing for technique, assist for stability with transition into standing from EOB and with pivotal movements. Pt with minimal to zero foot clearance on L LE when attempting to hop  Ambulation/Gait                Stairs            Wheelchair Mobility    Modified Rankin (Stroke Patients Only)       Balance Overall balance assessment: Needs assistance Sitting-balance support: Single extremity supported Sitting balance-Leahy Scale: Fair     Standing balance support: Bilateral upper extremity supported Standing balance-Leahy Scale: Poor Standing balance comment: reliant on bilateral UEs on RW                             Pertinent Vitals/Pain Pain Assessment: No/denies pain    Home Living Family/patient expects to be discharged to:: Private residence Living Arrangements: Spouse/significant other;Other relatives Available Help at Discharge: Family;Available 24 hours/day Type of Home: House Home Access: Level entry     Home Layout: One level Home Equipment: Cane - single point      Prior Function Level of Independence: Independent with assistive device(s)         Comments: ambulated with use of SPC     Hand Dominance   Dominant Hand: Right    Extremity/Trunk Assessment   Upper Extremity Assessment Upper Extremity Assessment: Overall WFL for tasks assessed  Lower Extremity Assessment Lower Extremity Assessment: Generalized weakness;RLE deficits/detail RLE Deficits / Details: Pt with 3/5 for hip flexion, hip abduction/adduction    Cervical / Trunk Assessment Cervical / Trunk Assessment: Normal  Communication   Communication: No difficulties  Cognition Arousal/Alertness: Awake/alert Behavior During Therapy: WFL for tasks  assessed/performed;Anxious Overall Cognitive Status: Within Functional Limits for tasks assessed                                        General Comments      Exercises     Assessment/Plan    PT Assessment Patient needs continued PT services  PT Problem List Decreased strength;Decreased activity tolerance;Decreased balance;Decreased mobility;Decreased coordination;Decreased knowledge of use of DME;Decreased safety awareness;Decreased knowledge of precautions       PT Treatment Interventions Stair training;Gait training;DME instruction;Functional mobility training;Therapeutic activities;Balance training;Neuromuscular re-education;Therapeutic exercise;Patient/family education    PT Goals (Current goals can be found in the Care Plan section)  Acute Rehab PT Goals Patient Stated Goal: to get stronger PT Goal Formulation: With patient Time For Goal Achievement: 12/27/17 Potential to Achieve Goals: Good    Frequency Min 5X/week   Barriers to discharge        Co-evaluation               AM-PAC PT "6 Clicks" Daily Activity  Outcome Measure Difficulty turning over in bed (including adjusting bedclothes, sheets and blankets)?: A Lot Difficulty moving from lying on back to sitting on the side of the bed? : A Lot Difficulty sitting down on and standing up from a chair with arms (e.g., wheelchair, bedside commode, etc,.)?: Unable Help needed moving to and from a bed to chair (including a wheelchair)?: A Lot Help needed walking in hospital room?: A Lot Help needed climbing 3-5 steps with a railing? : Total 6 Click Score: 10    End of Session Equipment Utilized During Treatment: Gait belt Activity Tolerance: Patient tolerated treatment well Patient left: in chair;with call bell/phone within reach;with chair alarm set Nurse Communication: Mobility status PT Visit Diagnosis: Other abnormalities of gait and mobility (R26.89)    Time: 2263-3354 PT Time  Calculation (min) (ACUTE ONLY): 25 min   Charges:   PT Evaluation $PT Eval Moderate Complexity: 1 Mod PT Treatments $Therapeutic Activity: 8-22 mins   PT G Codes:        [place], PT, DPT [place] 12/13/2017, 4:19 PM

## 2017-12-13 NOTE — Progress Notes (Signed)
PROGRESS NOTE  Marcus Hendrix GNF:[id number] DOB: [birth year] DOA: 12/05/2017 PCP: [name], MD  Brief History:  76 year old male with a history of diabetes mellitus, coronary artery disease, hyperlipidemia, hypertension, peripheral vascular disease, presenting with approximately 3-week history of right foo swelling, and drainage.  Patient does not recall any recent injury.  The patient was actually admitted to the hospital from November 28, 2017 through November 29, 2017 for chest pain during which time the patient was also treated for cellulitis of the right lower extremity.  He was discharged home with Augmentin and endorsed compliance with the medication.  He went to see his primary care provider on December 05, 2017 who noted increasing pain, edema and erythema.  As result, the patient was sent to the emergency department for further evaluation.  He states that right foot pain has been his major complaint for the past week.  He denies any fevers, chills, chest pain, shortness breath, coughing, hemoptysis, nausea, vomiting, diarrhea.  At the time of admission, WBC was 7.9, and the patient was afebrile hemodynamically stable.  Upon evaluation on the morning of December 06, 2017, the patient's right foot was noted to be cool to the touch.  In addition, the patient stated that for the past week he has had 2 dangle his right lower extremity to gravity to get relief of his pain.  He has also noted some claudication type symptoms.  Assessment/Plan: Ischemia to the right foot - Patient's symptoms show more PVD as a primary cause of presentation with possible critical limb ischemia. - Initially started on on IV heparin after discussion with Dr. Trula Slade, now subcu DVT prophylaxis dose. - Pain control with Percocet and gabapentin - Underwent aortogram on 12/08/2017, shows bilateral common iliac stenosis, right external iliac stenosis with severe right tibial, SFA, popliteal disease. -  Vascular surgery recommended right AKA Per surgeons notes: 76 y.o. male is s/p aortogram with B CIA VBX stent graft and R EIA stent 5 Days Post-Op Pt is pod # 1 R AKA, vascular managing.  Diabetic foot ulcer - will not start antibiotics--afebrile, hemodynamically stable, no leukocytosis - wound care consult - MRI foot negative for any osteomyelitis, shows edema and insufficiency due to poor vasculature. 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  Most likely will need SNF.  Family Communication:  no Family at bedside  Consultants:    Code Status:  FULL / DNR  DVT Prophylaxis: Subcu heparin   Procedures: As Listed in Progress Note Above  Antibiotics: Ceftriaxone   Subjective: No new complaints.   Objective: Vitals:   12/13/17 0904 12/13/17 1100 12/13/17 1200 12/13/17 1240  BP: 136/76 (!) 122/55 123/76   Pulse:  62 70   Resp:  (!) 21 15   Temp:    98.8 F (37.1 C)  TempSrc:    Oral  SpO2:  94% 92%   Weight:  Height:        Intake/Output Summary (Last 24 hours) at 12/13/2017 1257 Last data filed at 12/13/2017 0933 Gross per 24 hour  Intake 1480 ml  Output 1025 ml  Net 455 ml   Weight change:  Exam:   General: Not in acute distress,  Pt is alert and awake  HEENT: No icterus, No thrush, No neck mass, [place]/AT  Cardiovascular: RRR, S1/S2,  no rubs, no gallops  Respiratory: CTA bilaterally, no wheezing, no crackles, no rhonchi  Abdomen: Soft/+BS, non tender, non distended, no guarding  Extremities: R AKA, no deformities otherwise    Data Reviewed: I have personally reviewed following labs and imaging studies Basic Metabolic Panel: Recent Labs  Lab 12/08/17 0706  12/10/17 0237 12/11/17 0340 12/12/17 0745 12/12/17 1914 12/13/17 0328  NA 134*   < > 135 133* 133* 135 134*  K 4.1   < > 3.8 3.8 3.9 4.0 4.6  CL 102   < > 106 102 97* 102 100*  CO2 24   < > _0 19*  GLUCOSE 95   < > 59* 154* 172* 185* 297*  BUN 13   < > _1 22* 26*  CREATININE 0.99   < > 1.02 1.02 1.07 1.34* 1.20  CALCIUM 8.5*   < > 8.1* 8.2* 8.1* 8.3* 7.9*  MG 2.0  --   --   --   --   --   --    < > = values in this interval not displayed.   Liver Function Tests: Recent Labs  Lab 12/12/17 1914  AST 312*  ALT 184*  ALKPHOS 213*  BILITOT 2.4*  PROT 6.3*  ALBUMIN 2.7*   No results for input(s): LIPASE, AMYLASE in the last 168 hours. No results for input(s): AMMONIA in the last 168 hours. Coagulation Profile: Recent Labs  Lab 12/08/17 0706 12/12/17 0745  INR 1.21 1.16   CBC: Recent Labs  Lab 12/10/17 0237 12/11/17 0340 12/12/17 0745 12/12/17 1914 12/13/17 0328  WBC 9.9 11.5* 11.3* 15.5* 11.2*  NEUTROABS 7.2 8.5* 8.5* 12.1* 10.3*  HGB 9.3* 9.2* 8.8* 8.7* 8.4*  HCT 29.2* 28.8* 27.8* 28.0* 26.7*  MCV 95.1 94.4 95.5 94.9 95.7  PLT 247 233 255 277 229   Cardiac Enzymes: No results for input(s): CKTOTAL, CKMB, CKMBINDEX, TROPONINI in the last 168 hours. BNP: Invalid input(s): POCBNP CBG: Recent Labs  Lab 12/12/17 1527 12/12/17 1758 12/12/17 2021 12/13/17 0846 12/13/17 1242  GLUCAP 149* 138* 174* 382* 389*   HbA1C: No results for input(s): HGBA1C in the last 72 hours. Urine analysis:    Component Value Date/Time   COLORURINE YELLOW 11/28/2017 1412   APPEARANCEUR CLEAR 11/28/2017 1412   LABSPEC 1.020  11/28/2017 1412   PHURINE 5.5 11/28/2017 1412   GLUCOSEU >=500 (A) 11/28/2017 1412   HGBUR NEGATIVE 11/28/2017 1412   HGBUR negative 04/15/2008 1334   BILIRUBINUR NEGATIVE 11/28/2017 1412   KETONESUR NEGATIVE 11/28/2017 1412   PROTEINUR NEGATIVE 11/28/2017 1412   UROBILINOGEN 0.2 07/31/2013 0641   NITRITE NEGATIVE 11/28/2017 1412   LEUKOCYTESUR NEGATIVE 11/28/2017 1412   Sepsis Labs: _2 (procalcitonin:4,lacticidven:4) ) Recent Results (from the past 240 hour(s))  MRSA PCR Screening     Status: None   Collection Time: 12/07/17  4:33 PM  Result Value Ref Range Status   MRSA by PCR NEGATIVE NEGATIVE Final    Comment:        The GeneXpert MRSA Assay (FDA approved for NASAL specimens only), is  one component of a comprehensive MRSA colonization surveillance program. It is not intended to diagnose MRSA infection nor to guide or monitor treatment for MRSA infections.      Scheduled Meds: . amLODipine  10 mg Oral Daily  . aspirin EC  81 mg Oral Daily  . atorvastatin  80 mg Oral Daily  . feeding supplement (GLUCERNA SHAKE)  237 mL Oral BID BM  . feeding supplement (PRO-STAT SUGAR FREE 64)  30 mL Oral BID  . furosemide  40 mg Oral Daily  . gabapentin  100 mg Oral TID  . heparin  5,000 Units Subcutaneous Q8H  . insulin aspart  0-5 Units Subcutaneous QHS  . insulin aspart  0-9 Units Subcutaneous TID WC  . insulin glargine  20 Units Subcutaneous q morning - 10a  . lisinopril  20 mg Oral Daily  . metoprolol tartrate  100 mg Oral BID  . polyethylene glycol  17 g Oral Daily  . sodium chloride flush  3 mL Intravenous Q12H  . timolol  1 drop Left Eye q morning - 10a   Continuous Infusions: . sodium chloride    . sodium chloride Stopped (12/13/17 0436)  . cefTRIAXone (ROCEPHIN)  IV Stopped (12/12/17 2305)    Procedures/Studies: Dg Chest 2 View  Result Date: 11/28/2017 CLINICAL DATA:  Dyspnea and chest pain with nonproductive cough and wheezing x1 week. EXAM: CHEST  2  VIEW COMPARISON:  05/22/2016 FINDINGS: Stable cardiomegaly with moderate aortic atherosclerosis. Status post median sternotomy. Diffuse interstitial prominence and peribronchial thickening consistent bronchitic change. No alveolar consolidation. Chronic posterior costophrenic angle blunting that may represent a small effusion or pleural thickening. No acute osseous abnormality. IMPRESSION: Stable cardiomegaly with aortic atherosclerosis. Peribronchial thickening with increased interstitial lung markings suspicious for bronchitic change. Electronically Signed   By: Ashley Royalty M.D.   On: 11/28/2017 20:14   Mr Foot Right Wo Contrast  Result Date: 12/06/2017 CLINICAL DATA:  Right foot ulceration dorsal along the first MTP joint. Purulent collection. Swelling. EXAM: MRI OF THE RIGHT FOREFOOT WITHOUT CONTRAST TECHNIQUE: Multiplanar, multisequence MR imaging of the right forefoot was performed. No intravenous contrast was administered. COMPARISON:  Radiographs from 12/05/2017 FINDINGS: Despite efforts by the technologist and patient, motion artifact is present on today's exam and could not be eliminated. This reduces exam sensitivity and specificity. Bones/Joint/Cartilage There is an unusual pattern with edema signal tracking within all of the phalanges and in the heads of the metatarsals even on inversion recovery weighted images. There is some associated reduced T1 signal especially in the small digit phalanges. Ligaments Lisfranc ligament intact. Muscles and Tendons Diffuse edema tracks along the plantar musculature of the foot Soft tissues Extensive dorsal subcutaneous edema tracking along the foot and into the toes. IMPRESSION: 1. Unusual pattern with low-grade edema signal in all of the phalanges and the heads of the metatarsals. This is more striking in the small toe. There is also diffuse subcutaneous edema tracking into the toes. He would be unusual for osteomyelitis to involve all of the phalanges of the  toes and metatarsal heads at once, and the appearance is more suggestive of a systemic or global phenomenon in the distal foot, possibly edema due to vascular insufficiency or reflex sympathetic dystrophy. There is currently no drainable abscess. Careful surveillance may be warranted. 2. Diffuse edema within along the plantar musculature of the foot, probably neurogenic although myositis is not readily excluded on today's noncontrast exam. I do not see any gas tracking in the soft tissues of  the foot. Electronically Signed   By: Van Clines M.D.   On: 12/06/2017 18:44   Dg Foot Complete Right  Result Date: 12/05/2017 CLINICAL DATA:  Soft tissue wound right foot. EXAM: RIGHT FOOT COMPLETE - 3+ VIEW COMPARISON:  11/28/2017 FINDINGS: Mild degenerate change of the first MTP joint and midfoot. No evidence of bone destruction to suggest osteomyelitis. No air in the soft tissues. IMPRESSION: No acute findings. Electronically Signed   By: Marin Olp M.D.   On: 12/05/2017 19:19   Dg Foot Complete Right  Result Date: 11/28/2017 CLINICAL DATA:  Right foot pain and swelling. EXAM: RIGHT FOOT COMPLETE - 3+ VIEW COMPARISON:  None. FINDINGS: There is no evidence of fracture or dislocation. No focal bone erosions. Mild hallux valgus deformity with degenerative changes at the first MTP joint. Soft tissues are unremarkable. IMPRESSION: No acute bone abnormalities. Electronically Signed   By: Kerby Moors M.D.   On: 11/28/2017 17:38    Author:  Velvet Bathe [facility] Pager: [phone] 12/13/2017 12:57 PM     If 7PM-7AM, please contact night-coverage [email or web address] Password [email or web address] 12/13/2017, 12:57 PM   LOS: 8 days

## 2017-12-13 NOTE — Anesthesia Postprocedure Evaluation (Signed)
Anesthesia Post Note  Patient: Marcus Hendrix  Procedure(s) Performed: AMPUTATION ABOVE KNEE RIGHT (Right )     Patient location during evaluation: PACU Anesthesia Type: General Level of consciousness: awake and alert Pain management: pain level controlled Vital Signs Assessment: post-procedure vital signs reviewed and stable Respiratory status: spontaneous breathing, nonlabored ventilation, respiratory function stable and patient connected to nasal cannula oxygen Cardiovascular status: blood pressure returned to baseline and stable Postop Assessment: no apparent nausea or vomiting Anesthetic complications: no    Last Vitals:  Vitals:   12/13/17 0500 12/13/17 0600  BP: 134/82 134/71  Pulse: 67 (!) 125  Resp: 12 (!) 24  Temp:    SpO2: 98% 98%    Last Pain:  Vitals:   12/13/17 0318  TempSrc: Oral  PainSc:                  Audry Pili

## 2017-12-14 ENCOUNTER — Encounter (HOSPITAL_COMMUNITY): Payer: Self-pay | Admitting: Surgery

## 2017-12-14 LAB — BASIC METABOLIC PANEL
Anion gap: 9 (ref 5–15)
BUN: 30 mg/dL — AB (ref 6–20)
CHLORIDE: 99 mmol/L — AB (ref 101–111)
CO2: 25 mmol/L (ref 22–32)
CREATININE: 0.95 mg/dL (ref 0.61–1.24)
Calcium: 8.1 mg/dL — ABNORMAL LOW (ref 8.9–10.3)
Glucose, Bld: 308 mg/dL — ABNORMAL HIGH (ref 65–99)
POTASSIUM: 4.5 mmol/L (ref 3.5–5.1)
SODIUM: 133 mmol/L — AB (ref 135–145)

## 2017-12-14 LAB — GLUCOSE, CAPILLARY
GLUCOSE-CAPILLARY: 162 mg/dL — AB (ref 65–99)
GLUCOSE-CAPILLARY: 290 mg/dL — AB (ref 65–99)
Glucose-Capillary: 330 mg/dL — ABNORMAL HIGH (ref 65–99)
Glucose-Capillary: 301 mg/dL — ABNORMAL HIGH (ref 65–99)
Glucose-Capillary: 229 mg/dL — ABNORMAL HIGH (ref 65–99)

## 2017-12-14 LAB — CBC WITH DIFFERENTIAL/PLATELET
BASOS PCT: 0 %
Basophils Absolute: 0 10*3/uL (ref 0.0–0.1)
EOS ABS: 0 10*3/uL (ref 0.0–0.7)
EOS PCT: 0 %
HCT: 27.1 % — ABNORMAL LOW (ref 39.0–52.0)
HEMOGLOBIN: 8.7 g/dL — AB (ref 13.0–17.0)
Lymphocytes Relative: 8 %
Lymphs Abs: 1.4 10*3/uL (ref 0.7–4.0)
MCH: 30.4 pg (ref 26.0–34.0)
MCHC: 32.1 g/dL (ref 30.0–36.0)
MCV: 94.8 fL (ref 78.0–100.0)
Monocytes Absolute: 1.2 10*3/uL — ABNORMAL HIGH (ref 0.1–1.0)
Monocytes Relative: 7 %
NEUTROS PCT: 85 %
Neutro Abs: 13.9 10*3/uL — ABNORMAL HIGH (ref 1.7–7.7)
PLATELETS: 277 10*3/uL (ref 150–400)
RBC: 2.86 MIL/uL — AB (ref 4.22–5.81)
RDW: 13.7 % (ref 11.5–15.5)
WBC: 16.5 10*3/uL — AB (ref 4.0–10.5)

## 2017-12-14 MED ORDER — WHITE PETROLATUM EX OINT
TOPICAL_OINTMENT | CUTANEOUS | Status: AC
  Filled 2017-12-14: qty 28.35

## 2017-12-14 NOTE — Progress Notes (Addendum)
PROGRESS NOTE  Marcus Hendrix FRT:[id number] DOB: [birth year] DOA: 12/05/2017 PCP: [name], MD  Brief History:  76 year old male with a history of diabetes mellitus, coronary artery disease, hyperlipidemia, hypertension, peripheral vascular disease, presenting with approximately 3-week history of right foo swelling, and drainage.  Patient does not recall any recent injury.  The patient was actually admitted to the hospital from November 28, 2017 through November 29, 2017 for chest pain during which time the patient was also treated for cellulitis of the right lower extremity.  He was discharged home with Augmentin and endorsed compliance with the medication.  He went to see his primary care provider on December 05, 2017 who noted increasing pain, edema and erythema.  As result, the patient was sent to the emergency department for further evaluation.  He states that right foot pain has been his major complaint for the past week.  He denies any fevers, chills, chest pain, shortness breath, coughing, hemoptysis, nausea, vomiting, diarrhea.  At the time of admission, WBC was 7.9, and the patient was afebrile hemodynamically stable.  Upon evaluation on the morning of December 06, 2017, the patient's right foot was noted to be cool to the touch.  In addition, the patient stated that for the past week he has had 2 dangle his right lower extremity to gravity to get relief of his pain.  He has also noted some claudication type symptoms.  Pt is s/p R AKA. Vascular surgeon following  Assessment/Plan: Ischemia to the right foot - Patient's symptoms show more PVD as a primary cause of presentation with possible critical limb ischemia. - Initially started on on IV heparin after discussion with Dr. Trula Slade, now subcu DVT prophylaxis dose. - Pain control with Percocet and gabapentin - Underwent aortogram on 12/08/2017, shows bilateral common iliac stenosis, right external iliac stenosis with  severe right tibial, SFA, popliteal disease. - Vascular surgery recommended right AKA Per surgeons notes: 76 y.o. male is s/p aortogram with B CIA VBX stent graft and R EIA stent 5 Days Post-Op Pt is pod # 2 R AKA, vascular managing. No code events as such will transfer to med surg  Diabetic foot ulcer - will not start antibiotics--afebrile, hemodynamically stable, no leukocytosis - wound care consult - MRI foot negative for any osteomyelitis, shows edema and insufficiency due to poor vasculature. - ESR, CRP minimally elevated - Rocephin discontinued after AKA  Diabetes mellitus type 2, hypoglycemia, uncontrolled - Reduced dose Lantus due to hypoglycemia, sugars appear to be well controlled - NovoLog sliding scale - 11/29/2017 hemoglobin A1c 7.6  Essential hypertension -Continue amlodipine, metoprolol  Chronic diastolic CHF -He appears to be clinically compensated -Continue home dose of furosemide -November 29, 2017 echo EF 55%, grade 2 DD  Hyperlipidemia -holding statin due to transaminitis  Addendum: Transaminitis - addendum: will hold statin and reassess next am. 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  Most likely will need SNF.  Family Communication:  no Family at bedside  Consultants:    Code Status:  FULL / DNR  DVT Prophylaxis: Subcu heparin   Procedures: As Listed in Progress Note Above  Antibiotics: Ceftriaxone   Subjective: Nursing reports some confusion this am after receiving ambien. Pt is alert and has no new complaints during  my visit.  Objective: Vitals:   12/14/17 0600 12/14/17 0700 12/14/17 0800 12/14/17 0842  BP: (!) 165/61 (!) 158/63 (!) 175/71   Pulse:  64 65   Resp: _0 Temp:    98.5 F (36.9 C)  TempSrc:    Oral  SpO2:  96% 96%     Weight:      Height:        Intake/Output Summary (Last 24 hours) at 12/14/2017 7353 Last data filed at 12/14/2017 0400 Gross per 24 hour  Intake -  Output 450 ml  Net -450 ml   Weight change:  Exam:   General: Pt in nad, No icterus, No thrush, No neck mass, /AT  Cardiovascular: RRR, S1/S2, no rubs, no gallops  Respiratory: CTA bilaterally, no wheezing, no crackles, no rhonchi  Abdomen: Soft/+BS, non tender, non distended, no guarding  Extremities: R AKA, no deformities otherwise    Data Reviewed: I have personally reviewed following labs and imaging studies Basic Metabolic Panel: Recent Labs  Lab 12/08/17 0706  12/11/17 0340 12/12/17 0745 12/12/17 1914 12/13/17 0328 12/14/17 0350  NA 134*   < > 133* 133* 135 134* 133*  K 4.1   < > 3.8 3.9 4.0 4.6 4.5  CL 102   < > 102 97* 102 100* 99*  CO2 24   < > _1 19* 25  GLUCOSE 95   < > 154* 172* 185* 297* 308*  BUN 13   < > 13 19 22* 26* 30*  CREATININE 0.99   < > 1.02 1.07 1.34* 1.20 0.95  CALCIUM 8.5*   < > 8.2* 8.1* 8.3* 7.9* 8.1*  MG 2.0  --   --   --   --   --   --    < > = values in this interval not displayed.   Liver Function Tests: Recent Labs  Lab 12/12/17 1914  AST 312*  ALT 184*  ALKPHOS 213*  BILITOT 2.4*  PROT 6.3*  ALBUMIN 2.7*   No results for input(s): LIPASE, AMYLASE in the last 168 hours. No results for input(s): AMMONIA in the last 168 hours. Coagulation Profile: Recent Labs  Lab 12/08/17 0706 12/12/17 0745  INR 1.21 1.16   CBC: Recent Labs  Lab 12/11/17 0340 12/12/17 0745 12/12/17 1914 12/13/17 0328 12/14/17 0350  WBC 11.5* 11.3* 15.5* 11.2* 16.5*  NEUTROABS 8.5* 8.5* 12.1* 10.3* 13.9*  HGB 9.2* 8.8* 8.7* 8.4* 8.7*  HCT 28.8* 27.8* 28.0* 26.7* 27.1*  MCV 94.4 95.5 94.9 95.7 94.8  PLT 233 255 277 229 277   Cardiac Enzymes: No results for input(s): CKTOTAL, CKMB, CKMBINDEX, TROPONINI in the last 168 hours. BNP: Invalid input(s): POCBNP CBG: Recent Labs  Lab  12/13/17 1242 12/13/17 1707 12/13/17 1950 12/14/17 0009 12/14/17 0845  GLUCAP 389* 392* 340* 290* 330*   HbA1C: No results for input(s): HGBA1C in the last 72 hours. Urine analysis:    Component Value Date/Time   COLORURINE YELLOW 11/28/2017 1412   APPEARANCEUR CLEAR 11/28/2017 1412   LABSPEC 1.020 11/28/2017 1412   PHURINE 5.5 11/28/2017 1412   GLUCOSEU >=500 (A) 11/28/2017 1412   HGBUR NEGATIVE 11/28/2017 1412   HGBUR negative 04/15/2008 1334   BILIRUBINUR NEGATIVE 11/28/2017 1412   KETONESUR NEGATIVE 11/28/2017 1412   PROTEINUR NEGATIVE 11/28/2017 1412   UROBILINOGEN 0.2 07/31/2013 0641   NITRITE NEGATIVE 11/28/2017 1412   LEUKOCYTESUR NEGATIVE 11/28/2017 1412   Sepsis Labs: _2 (procalcitonin:4,lacticidven:4) ) Recent Results (from the past 240  hour(s))  MRSA PCR Screening     Status: None   Collection Time: 12/07/17  4:33 PM  Result Value Ref Range Status   MRSA by PCR NEGATIVE NEGATIVE Final    Comment:        The GeneXpert MRSA Assay (FDA approved for NASAL specimens only), is one component of a comprehensive MRSA colonization surveillance program. It is not intended to diagnose MRSA infection nor to guide or monitor treatment for MRSA infections.      Scheduled Meds: . amLODipine  10 mg Oral Daily  . aspirin EC  81 mg Oral Daily  . atorvastatin  80 mg Oral Daily  . feeding supplement (GLUCERNA SHAKE)  237 mL Oral BID BM  . feeding supplement (PRO-STAT SUGAR FREE 64)  30 mL Oral BID  . furosemide  40 mg Oral Daily  . gabapentin  100 mg Oral TID  . heparin  5,000 Units Subcutaneous Q8H  . insulin aspart  0-5 Units Subcutaneous QHS  . insulin aspart  0-9 Units Subcutaneous TID WC  . insulin glargine  25 Units Subcutaneous q morning - 10a  . lisinopril  20 mg Oral Daily  . metoprolol tartrate  100 mg Oral BID  . polyethylene glycol  17 g Oral Daily  . sodium chloride flush  3 mL Intravenous Q12H  . timolol  1 drop Left Eye q morning - 10a  .  white petrolatum       Continuous Infusions: . sodium chloride    . sodium chloride Stopped (12/13/17 0436)    Procedures/Studies: Dg Chest 2 View  Result Date: 11/28/2017 CLINICAL DATA:  Dyspnea and chest pain with nonproductive cough and wheezing x1 week. EXAM: CHEST  2 VIEW COMPARISON:  05/22/2016 FINDINGS: Stable cardiomegaly with moderate aortic atherosclerosis. Status post median sternotomy. Diffuse interstitial prominence and peribronchial thickening consistent bronchitic change. No alveolar consolidation. Chronic posterior costophrenic angle blunting that may represent a small effusion or pleural thickening. No acute osseous abnormality. IMPRESSION: Stable cardiomegaly with aortic atherosclerosis. Peribronchial thickening with increased interstitial lung markings suspicious for bronchitic change. Electronically Signed   By: Ashley Royalty M.D.   On: 11/28/2017 20:14   Mr Foot Right Wo Contrast  Result Date: 12/06/2017 CLINICAL DATA:  Right foot ulceration dorsal along the first MTP joint. Purulent collection. Swelling. EXAM: MRI OF THE RIGHT FOREFOOT WITHOUT CONTRAST TECHNIQUE: Multiplanar, multisequence MR imaging of the right forefoot was performed. No intravenous contrast was administered. COMPARISON:  Radiographs from 12/05/2017 FINDINGS: Despite efforts by the technologist and patient, motion artifact is present on today's exam and could not be eliminated. This reduces exam sensitivity and specificity. Bones/Joint/Cartilage There is an unusual pattern with edema signal tracking within all of the phalanges and in the heads of the metatarsals even on inversion recovery weighted images. There is some associated reduced T1 signal especially in the small digit phalanges. Ligaments Lisfranc ligament intact. Muscles and Tendons Diffuse edema tracks along the plantar musculature of the foot Soft tissues Extensive dorsal subcutaneous edema tracking along the foot and into the toes. IMPRESSION: 1.  Unusual pattern with low-grade edema signal in all of the phalanges and the heads of the metatarsals. This is more striking in the small toe. There is also diffuse subcutaneous edema tracking into the toes. He would be unusual for osteomyelitis to involve all of the phalanges of the toes and metatarsal heads at once, and the appearance is more suggestive of a systemic or global phenomenon in the distal foot, possibly edema  due to vascular insufficiency or reflex sympathetic dystrophy. There is currently no drainable abscess. Careful surveillance may be warranted. 2. Diffuse edema within along the plantar musculature of the foot, probably neurogenic although myositis is not readily excluded on today's noncontrast exam. I do not see any gas tracking in the soft tissues of the foot. Electronically Signed   By: Van Clines M.D.   On: 12/06/2017 18:44   Dg Foot Complete Right  Result Date: 12/05/2017 CLINICAL DATA:  Soft tissue wound right foot. EXAM: RIGHT FOOT COMPLETE - 3+ VIEW COMPARISON:  11/28/2017 FINDINGS: Mild degenerate change of the first MTP joint and midfoot. No evidence of bone destruction to suggest osteomyelitis. No air in the soft tissues. IMPRESSION: No acute findings. Electronically Signed   By: Marin Olp M.D.   On: 12/05/2017 19:19   Dg Foot Complete Right  Result Date: 11/28/2017 CLINICAL DATA:  Right foot pain and swelling. EXAM: RIGHT FOOT COMPLETE - 3+ VIEW COMPARISON:  None. FINDINGS: There is no evidence of fracture or dislocation. No focal bone erosions. Mild hallux valgus deformity with degenerative changes at the first MTP joint. Soft tissues are unremarkable. IMPRESSION: No acute bone abnormalities. Electronically Signed   By: Kerby Moors M.D.   On: 11/28/2017 17:38    Author:  Velvet Bathe [facility] Pager: [phone] 12/14/2017 9:07 AM     If 7PM-7AM, please contact night-coverage [email or web address] Password [email or web address] 12/14/2017, 9:07 AM   LOS: 9 days

## 2017-12-14 NOTE — Progress Notes (Signed)
Physical Therapy Treatment Patient Details Name: Marcus Hendrix MRN: [id number] DOB: [birth year] Today's Date: 12/14/2017    History of Present Illness Pt is a 76 y/o male s/p R transfemoral amputation. PMH including but not limited to DM, HTN, HLD, PVD. Of note, following surgery after return to floor pt coded CPR was performed for two minutes and pt revived.    PT Comments    Patient progressing to OOB this session with +2 A.  Able to stand with RW and take couple of hops to get to chair, but fatigues.  Will need continued skilled PT in the acute setting.    Follow Up Recommendations  CIR;Supervision/Assistance - 24 hour     Equipment Recommendations  Rolling walker with 5" wheels;Wheelchair (measurements PT);Wheelchair cushion (measurements PT)    Recommendations for Other Services Rehab consult     Precautions / Restrictions Precautions Precautions: Fall Precaution Comments: new Right AKA Restrictions Weight Bearing Restrictions: Yes RLE Weight Bearing: Non weight bearing    Mobility  Bed Mobility Overal bed mobility: Needs Assistance Bed Mobility: Supine to Sit     Supine to sit: Mod assist;+2 for safety/equipment;HOB elevated     General bed mobility comments: increased time and effort with cues for technique, assist for lifting trunk and scooting to EOB  Transfers Overall transfer level: Needs assistance Equipment used: Rolling walker (2 wheeled) Transfers: Sit to/from [facility] Sit to Stand: +2 physical assistance;+2 safety/equipment;Mod assist;From elevated surface Stand pivot transfers: Mod assist;+2 physical assistance;+2 safety/equipment       General transfer comment: increased time and effort, cueing for technique, assist for stability with transition into standing from EOB and with pivotal movements. Pt with minimal to zero foot clearance on L LE when attempting to hop; heavy lowering assist for safety into  chair  Ambulation/Gait                 Stairs            Wheelchair Mobility    Modified Rankin (Stroke Patients Only)       Balance Overall balance assessment: Needs assistance Sitting-balance support: Single extremity supported Sitting balance-Leahy Scale: Fair     Standing balance support: Bilateral upper extremity supported Standing balance-Leahy Scale: Poor Standing balance comment: reliant on bilateral UEs on RW                            Cognition Arousal/Alertness: Suspect due to medications;Lethargic Behavior During Therapy: Flat affect Overall Cognitive Status: No family/caregiver present to determine baseline cognitive functioning                                 General Comments: Pt able to follow one step commands - required vc to open eyes       Exercises General Exercises - Lower Extremity Ankle Circles/Pumps: AROM;Left;10 reps;Seated Long Arc Quad: AROM;Left;10 reps;Seated Hip Flexion/Marching: AROM;Both;10 reps;Seated    General Comments General comments (skin integrity, edema, etc.): SpO2 after transfer to recliner 85%, O2 applied at 2LPM and RN aware. SpO2 up to 93%      Pertinent Vitals/Pain Pain Assessment: Faces Faces Pain Scale: Hurts even more Pain Location: R residual limb Pain Descriptors / Indicators: Aching;Grimacing Pain Intervention(s): Monitored during session;Repositioned    Home Living Family/patient expects to be discharged to:: Private residence Living Arrangements: Spouse/significant other;Other relatives(58+ year old grandson) Available Help at Discharge:  Family;Available 24 hours/day Type of Home: House Home Access: Level entry   Home Layout: One level Home Equipment: Cane - single point      Prior Function Level of Independence: Independent with assistive device(s)      Comments: ambulated with use of SPC   PT Goals (current goals can now be found in the care plan section)  Acute Rehab PT Goals Patient Stated Goal: to get stronger Progress towards PT goals: Progressing toward goals    Frequency    Min 4X/week      PT Plan Current plan remains appropriate;Frequency needs to be updated    Co-evaluation PT/OT/SLP Co-Evaluation/Treatment: Yes Reason for Co-Treatment: For patient/therapist safety;To address functional/ADL transfers PT goals addressed during session: Mobility/safety with mobility;Balance;Proper use of DME OT goals addressed during session: ADL's and self-care      AM-PAC PT "6 Clicks" Daily Activity  Outcome Measure  Difficulty turning over in bed (including adjusting bedclothes, sheets and blankets)?: A Lot Difficulty moving from lying on back to sitting on the side of the bed? : Unable Difficulty sitting down on and standing up from a chair with arms (e.g., wheelchair, bedside commode, etc,.)?: Unable Help needed moving to and from a bed to chair (including a wheelchair)?: A Lot Help needed walking in hospital room?: Total Help needed climbing 3-5 steps with a railing? : Total 6 Click Score: 8    End of Session Equipment Utilized During Treatment: Gait belt Activity Tolerance: Patient limited by fatigue Patient left: in chair;with call bell/phone within reach;with chair alarm set Nurse Communication: Mobility status PT Visit Diagnosis: Other abnormalities of gait and mobility (R26.89)     Time: 0762-2633 PT Time Calculation (min) (ACUTE ONLY): 15 min  Charges:  $Therapeutic Activity: 8-22 mins                    G CodesMagda Kiel, [place] [phone] 12/14/2017    Reginia Naas 12/14/2017, 2:20 PM

## 2017-12-14 NOTE — Progress Notes (Signed)
   Daily Progress Note   Assessment/Planning:   POD #2 s/p R AKA   R AKA looks viable with staple line intact  ABD pad and stump sock to R AKA once available   Subjective  - 2 Days Post-Op   Pain tolerable, no further code events   Objective   Vitals:   12/14/17 0500 12/14/17 0600 12/14/17 0700 12/14/17 0800  BP: (!) 166/61 (!) 165/61 (!) 158/63 (!) 175/71  Pulse:   64 65  Resp: 16 15 18 16   Temp:      TempSrc:      SpO2: 94%  96% 96%  Weight:      Height:         Intake/Output Summary (Last 24 hours) at 12/14/2017 0836 Last data filed at 12/14/2017 0400 Gross per 24 hour  Intake -  Output 450 ml  Net -450 ml    VASC R AKA, steristrips in place, no active bleeding, staple line intact    Laboratory   CBC CBC Latest Ref Rng & Units 12/14/2017 12/13/2017 12/12/2017  WBC 4.0 - 10.5 K/uL 16.5(H) 11.2(H) 15.5(H)  Hemoglobin 13.0 - 17.0 g/dL 8.7(L) 8.4(L) 8.7(L)  Hematocrit 39.0 - 52.0 % 27.1(L) 26.7(L) 28.0(L)  Platelets 150 - 400 K/uL 277 229 277    BMET    Component Value Date/Time   NA 133 (L) 12/14/2017 0350   K 4.5 12/14/2017 0350   CL 99 (L) 12/14/2017 0350   CO2 25 12/14/2017 0350   GLUCOSE 308 (H) 12/14/2017 0350   BUN 30 (H) 12/14/2017 0350   CREATININE 0.95 12/14/2017 0350   CALCIUM 8.1 (L) 12/14/2017 0350   GFRNONAA >60 12/14/2017 0350   GFRAA >60 12/14/2017 0350     Adele Barthel, MD, FACS [facility] Office: [phone] Pager: [phone]  12/14/2017, 8:36 AM

## 2017-12-14 NOTE — Evaluation (Signed)
Occupational Therapy Evaluation Patient Details Name: Marcus Hendrix MRN: [id number] DOB: [birth year] Today's Date: 12/14/2017    History of Present Illness Pt is a 76 y/o male s/p R transfemoral amputation. PMH including but not limited to DM, HTN, HLD, PVD. Of note, following surgery after return to floor pt coded CPR was performed for two minutes and pt revived.   Clinical Impression   PTA Pt mod I for mobility with SPC and independent in ADL. Pt currently max A for LB ADL, min to mod A for UB ADL and mod +2 for transfers. Pt initially lethargic (suspect medications) but agreeable and pleasant during therapy session. Pt will benefit from skilled OT in the acute setting and will require CIR level therapy to maximize safety and independence in ADL/functional transfers and return to PLOF. CIR is also the best placement as they can provide education for new AKA and also caregiver education in how best to assist. OT will continue to follow in the acute setting. Next session to focus on balance for LB ADL and transfers.     Follow Up Recommendations  CIR;Supervision/Assistance - 24 hour    Equipment Recommendations  Other (comment)(defer to next venue of care)    Recommendations for Other Services Rehab consult     Precautions / Restrictions Precautions Precautions: Fall Precaution Comments: new Right AKA Restrictions Weight Bearing Restrictions: Yes RLE Weight Bearing: Non weight bearing      Mobility Bed Mobility Overal bed mobility: Needs Assistance Bed Mobility: Supine to Sit     Supine to sit: Mod assist;+2 for safety/equipment;HOB elevated     General bed mobility comments: increased time and effort, vc for sequencing, mod A for trunk elevation, and use of bed pad to assist with scooting hips HOB. Pt with good arm strength to assist  Transfers Overall transfer level: Needs assistance Equipment used: Rolling walker (2 wheeled) Transfers: Sit to/from [employer] Sit to Stand: +2 physical assistance;+2 safety/equipment;Mod assist;From elevated surface(bed slightly elevated) Stand pivot transfers: Mod assist;+2 physical assistance;+2 safety/equipment       General transfer comment: increased time and effort, cueing for technique, assist for stability with transition into standing from EOB and with pivotal movements. Pt with minimal to zero foot clearance on L LE when attempting to hop    Balance Overall balance assessment: Needs assistance Sitting-balance support: Single extremity supported Sitting balance-Leahy Scale: Fair     Standing balance support: Bilateral upper extremity supported Standing balance-Leahy Scale: Poor Standing balance comment: reliant on bilateral UEs on RW                           ADL either performed or assessed with clinical judgement   ADL Overall ADL's : Needs assistance/impaired Eating/Feeding: Set up   Grooming: Wash/dry hands;Wash/dry face;Set up;Sitting Grooming Details (indicate cue type and reason): in recliner Upper Body Bathing: Moderate assistance Upper Body Bathing Details (indicate cue type and reason): for back Lower Body Bathing: Moderate assistance;Sitting/lateral leans Lower Body Bathing Details (indicate cue type and reason): decreased balance for accessing LB Upper Body Dressing : Minimal assistance   Lower Body Dressing: Maximal assistance;+2 for physical assistance;+2 for safety/equipment;Sit to/from stand   Toilet Transfer: Moderate assistance;+2 for physical assistance;+2 for safety/equipment;Stand-pivot;Cueing for sequencing;BSC;RW Toilet Transfer Details (indicate cue type and reason): vc throughout, transferred to the left Toileting- Clothing Manipulation and Hygiene: Maximal assistance;+2 for physical assistance;+2 for safety/equipment;Sit to/from stand       Functional  mobility during ADLs: Moderate assistance;+2 for physical assistance;+2 for  safety/equipment;Rolling walker;Cueing for sequencing       Vision Patient Visual Report: No change from baseline       Perception     Praxis      Pertinent Vitals/Pain Pain Assessment: Faces Faces Pain Scale: Hurts even more Pain Location: R residual limb Pain Descriptors / Indicators: Aching;Grimacing Pain Intervention(s): Monitored during session;Repositioned     Hand Dominance Right   Extremity/Trunk Assessment Upper Extremity Assessment Upper Extremity Assessment: Overall WFL for tasks assessed   Lower Extremity Assessment Lower Extremity Assessment: RLE deficits/detail;Defer to PT evaluation RLE Deficits / Details: able to lift up residual limb for placement of pillow/elevation   Cervical / Trunk Assessment Cervical / Trunk Assessment: Normal   Communication Communication Communication: No difficulties   Cognition Arousal/Alertness: Suspect due to medications;Lethargic Behavior During Therapy: Flat affect Overall Cognitive Status: No family/caregiver present to determine baseline cognitive functioning                                 General Comments: Pt able to follow one step commands - required vc to open eyes    General Comments  After transfer to recliner, Pt's O2 levels were reading at 85%. RN notified and 2L O2 applied through nasal cannula.     Exercises     Shoulder Instructions      Home Living Family/patient expects to be discharged to:: Private residence Living Arrangements: Spouse/significant other;Other relatives(30+ year old grandson) Available Help at Discharge: Family;Available 24 hours/day Type of Home: House Home Access: Level entry     Home Layout: One level     Bathroom Shower/Tub: Teacher, early years/pre: Standard     Home Equipment: Cane - single point          Prior Functioning/Environment Level of Independence: Independent with assistive device(s)        Comments: ambulated with use of  SPC        OT Problem List: Decreased activity tolerance;Impaired balance (sitting and/or standing);Decreased coordination;Decreased safety awareness;Decreased knowledge of use of DME or AE;Decreased knowledge of precautions;Pain;Other (comment)(new AKA)      OT Treatment/Interventions: Self-care/ADL training;Therapeutic exercise;DME and/or AE instruction;Therapeutic activities;Patient/family education;Balance training    OT Goals(Current goals can be found in the care plan section) Acute Rehab OT Goals Patient Stated Goal: to get stronger OT Goal Formulation: With patient Time For Goal Achievement: 12/28/17 Potential to Achieve Goals: Good ADL Goals Pt Will Perform Grooming: with modified independence;sitting Pt Will Perform Lower Body Bathing: with supervision;sitting/lateral leans Pt Will Perform Lower Body Dressing: with min guard assist;with caregiver independent in assisting;sitting/lateral leans Pt Will Transfer to Toilet: stand pivot transfer;with min guard assist Pt Will Perform Toileting - Clothing Manipulation and hygiene: with supervision;sitting/lateral leans Additional ADL Goal #1: Pt will perform bed mobility at supervision level prior to engaging in ADL activity.  OT Frequency: Min 3X/week   Barriers to D/C:            Co-evaluation PT/OT/SLP Co-Evaluation/Treatment: Yes Reason for Co-Treatment: For patient/therapist safety;To address functional/ADL transfers PT goals addressed during session: Mobility/safety with mobility;Balance;Proper use of DME OT goals addressed during session: ADL's and self-care      AM-PAC PT "6 Clicks" Daily Activity     Outcome Measure Help from another person eating meals?: None Help from another person taking care of personal grooming?: A Little Help from another person  toileting, which includes using toliet, bedpan, or urinal?: A Lot Help from another person bathing (including washing, rinsing, drying)?: A Lot Help from another  person to put on and taking off regular upper body clothing?: A Little Help from another person to put on and taking off regular lower body clothing?: A Lot 6 Click Score: 16   End of Session Equipment Utilized During Treatment: Gait belt;Rolling walker;Oxygen Nurse Communication: Mobility status  Activity Tolerance: Patient tolerated treatment well Patient left: in chair;with call bell/phone within reach;with chair alarm set;with nursing/sitter in room  OT Visit Diagnosis: Unsteadiness on feet (R26.81);Other abnormalities of gait and mobility (R26.89);Pain Pain - Right/Left: Right Pain - part of body: Leg                Time: 2671-2458 OT Time Calculation (min): 25 min Charges:  OT General Charges $OT Visit: 1 Visit OT Evaluation $OT Eval Moderate Complexity: 1 Mod G-Codes:     Hulda Humphrey OTR/L [place] 12/14/2017, 2:11 PM

## 2017-12-14 NOTE — Progress Notes (Signed)
Inpatient Rehabilitation  Per PT/OT request, patient was screened by Gunnar Fusi for appropriateness for an Inpatient Acute Rehab consult.  At this time we are recommending an Inpatient Rehab consult and note that one has been placed.  Please plan for an Admission's Coordinator to follow up after the consult has been completed.   Carmelia Roller., CCC/SLP Admission Coordinator  [place]  Cell [phone]

## 2017-12-14 NOTE — Progress Notes (Signed)
OT Cancellation Note  Patient Details Name: Marcus Hendrix MRN: [id number] DOB: 1941/02/12   Cancelled Treatment:    Reason Eval/Treat Not Completed: Patient at procedure or test/ unavailable. Pt able to transfer units in the hospital. OT will continue to follow for evaluation as schedule allows after transfer.  [place] 12/14/2017, 9:51 AM  Hulda Humphrey OTR/L [phone]

## 2017-12-14 NOTE — Progress Notes (Signed)
Orthopedic Tech Progress Note Patient Details:  Marcus Hendrix December 22, 1940 [id number]  Patient ID: Marcus Hendrix, male   DOB: 04-23-1941, 76 y.o.   MRN: [id number]   [name] 12/14/2017, 11:20 AMCalled [employer] for stump sock.

## 2017-12-15 ENCOUNTER — Telehealth: Payer: Self-pay | Admitting: Surgery

## 2017-12-15 DIAGNOSIS — I25810 Atherosclerosis of coronary artery bypass graft(s) without angina pectoris: Secondary | ICD-10-CM

## 2017-12-15 DIAGNOSIS — E785 Hyperlipidemia, unspecified: Secondary | ICD-10-CM

## 2017-12-15 DIAGNOSIS — Z89611 Acquired absence of right leg above knee: Secondary | ICD-10-CM

## 2017-12-15 DIAGNOSIS — Z8679 Personal history of other diseases of the circulatory system: Secondary | ICD-10-CM

## 2017-12-15 DIAGNOSIS — I739 Peripheral vascular disease, unspecified: Secondary | ICD-10-CM

## 2017-12-15 DIAGNOSIS — M79604 Pain in right leg: Secondary | ICD-10-CM

## 2017-12-15 DIAGNOSIS — L089 Local infection of the skin and subcutaneous tissue, unspecified: Secondary | ICD-10-CM

## 2017-12-15 DIAGNOSIS — S78111A Complete traumatic amputation at level between right hip and knee, initial encounter: Secondary | ICD-10-CM

## 2017-12-15 DIAGNOSIS — R001 Bradycardia, unspecified: Secondary | ICD-10-CM

## 2017-12-15 DIAGNOSIS — I10 Essential (primary) hypertension: Secondary | ICD-10-CM

## 2017-12-15 DIAGNOSIS — R740 Nonspecific elevation of levels of transaminase and lactic acid dehydrogenase [LDH]: Secondary | ICD-10-CM

## 2017-12-15 DIAGNOSIS — E11628 Type 2 diabetes mellitus with other skin complications: Secondary | ICD-10-CM

## 2017-12-15 DIAGNOSIS — D72829 Elevated white blood cell count, unspecified: Secondary | ICD-10-CM

## 2017-12-15 DIAGNOSIS — I5042 Chronic combined systolic (congestive) and diastolic (congestive) heart failure: Secondary | ICD-10-CM

## 2017-12-15 DIAGNOSIS — E1142 Type 2 diabetes mellitus with diabetic polyneuropathy: Secondary | ICD-10-CM

## 2017-12-15 DIAGNOSIS — R7401 Elevation of levels of liver transaminase levels: Secondary | ICD-10-CM

## 2017-12-15 DIAGNOSIS — I469 Cardiac arrest, cause unspecified: Secondary | ICD-10-CM

## 2017-12-15 LAB — GLUCOSE, CAPILLARY
GLUCOSE-CAPILLARY: 266 mg/dL — AB (ref 65–99)
GLUCOSE-CAPILLARY: 163 mg/dL — AB (ref 65–99)
Glucose-Capillary: 242 mg/dL — ABNORMAL HIGH (ref 65–99)
Glucose-Capillary: 102 mg/dL — ABNORMAL HIGH (ref 65–99)

## 2017-12-15 LAB — CBC WITH DIFFERENTIAL/PLATELET
BASOS PCT: 0 %
Basophils Absolute: 0 10*3/uL (ref 0.0–0.1)
EOS ABS: 0.1 10*3/uL (ref 0.0–0.7)
Eosinophils Relative: 1 %
HCT: 27.8 % — ABNORMAL LOW (ref 39.0–52.0)
Hemoglobin: 8.7 g/dL — ABNORMAL LOW (ref 13.0–17.0)
Lymphocytes Relative: 15 %
Lymphs Abs: 1.8 10*3/uL (ref 0.7–4.0)
MCH: 30.1 pg (ref 26.0–34.0)
MCHC: 31.3 g/dL (ref 30.0–36.0)
MCV: 96.2 fL (ref 78.0–100.0)
MONO ABS: 0.8 10*3/uL (ref 0.1–1.0)
Monocytes Relative: 7 %
NEUTROS ABS: 9 10*3/uL — AB (ref 1.7–7.7)
NEUTROS PCT: 77 %
PLATELETS: 271 10*3/uL (ref 150–400)
RBC: 2.89 MIL/uL — ABNORMAL LOW (ref 4.22–5.81)
RDW: 14.1 % (ref 11.5–15.5)
WBC: 11.7 10*3/uL — ABNORMAL HIGH (ref 4.0–10.5)

## 2017-12-15 LAB — COMPREHENSIVE METABOLIC PANEL
ALT: 101 U/L — ABNORMAL HIGH (ref 17–63)
ANION GAP: 10 (ref 5–15)
AST: 118 U/L — ABNORMAL HIGH (ref 15–41)
Albumin: 2.6 g/dL — ABNORMAL LOW (ref 3.5–5.0)
Alkaline Phosphatase: 170 U/L — ABNORMAL HIGH (ref 38–126)
BUN: 19 mg/dL (ref 6–20)
CHLORIDE: 101 mmol/L (ref 101–111)
CO2: 25 mmol/L (ref 22–32)
Calcium: 8.1 mg/dL — ABNORMAL LOW (ref 8.9–10.3)
Creatinine, Ser: 0.78 mg/dL (ref 0.61–1.24)
GFR calc non Af Amer: >60 mL/min (ref >60)
Glucose, Bld: 188 mg/dL — ABNORMAL HIGH (ref 65–99)
Potassium: 4.1 mmol/L (ref 3.5–5.1)
SODIUM: 136 mmol/L (ref 135–145)
Total Bilirubin: 1.4 mg/dL — ABNORMAL HIGH (ref 0.3–1.2)
Total Protein: 5.9 g/dL — ABNORMAL LOW (ref 6.5–8.1)

## 2017-12-15 MED FILL — Medication: Qty: 1 | Status: AC

## 2017-12-15 NOTE — Telephone Encounter (Signed)
-----   Message from Mena Goes, RN sent at 12/13/2017 10:17 AM EST ----- Regarding: 4 weeks post op AKA   ----- Message ----- From: Gabriel Earing, PA-C Sent: 12/12/2017   6:04 PM To: Vvs Charge Pool  S/p right AKA 12/12/17.  F/u with Dr. Trula Slade in 4 weeks.  Thanks

## 2017-12-15 NOTE — Social Work (Signed)
CSW received call from Summer [place] at [facility]. Pt family requesting SNF placement at [facility]. CSW will initiate SNF process.    CSW continuing to follow to support discharge to SNF when medically appropriate.   [place], [place] Work [phone]

## 2017-12-15 NOTE — NC FL2 (Addendum)
[place] LEVEL OF CARE SCREENING TOOL     IDENTIFICATION  Patient Name: Marcus Hendrix Birthdate: December 21, 1940 Sex: male Admission Date (Current Location): 12/05/2017  [facility] and [place] Number:  [employer] and Address:  [facility] [place], [place] [address] [place], [place] [address]      Provider Number: [id number]  Attending Physician Name and Address:  Mariel Aloe, MD  Relative Name and Phone Number:   Curlee Bogan [phone]    Current Level of Care: Hospital Recommended Level of Care: [place] Prior Approval Number:    Date Approved/Denied:   PASRR Number:   [id number]   Discharge Plan: SNF    Current Diagnoses: Patient Active Problem List   Diagnosis Date Noted  . Pain of right lower extremity   . Type 2 diabetes mellitus with peripheral neuropathy (HCC)   . Benign essential HTN   . Coronary artery disease involving coronary bypass graft of native heart without angina pectoris   . Chronic combined systolic and diastolic congestive heart failure ([place])   . Leukocytosis   . Transaminitis   . Unilateral AKA, right ([place])   . Cardiac arrest ([place])   . Bradycardia 12/06/2017  . History of atherosclerotic cardiovascular disease 12/06/2017  . Diabetic foot infection ([place]) 11/28/2017  . Acute CHF (congestive heart failure) ([place]) 11/28/2017  . Rectal bleeding 10/04/2013  . Heme positive stool 10/04/2013  . Community acquired pneumonia 07/30/2013  . DM type 2 (diabetes mellitus, type 2) ([place]) 07/30/2013  . HTN (hypertension), benign 07/30/2013  . CAROTID BRUITS, BILATERAL 07/16/2010  . MITRAL VALVE PROLAPSE 08/22/2009  . DM 07/22/2009  . Atherosclerotic cardiovascular disease 07/22/2009  . 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 ERECTILE DYSFUNCTION 05/27/2008  . INGUINAL HERNIA, RIGHT 10/19/2007  . ANEMIA, NORMOCYTIC 10/09/2007  . Dyslipidemia 12/19/2006  . PERIPHERAL NEUROPATHY  12/19/2006  . Unspecified glaucoma 12/19/2006  . Essential hypertension 12/19/2006  . MYOCARDIAL INFARCTION, HX OF 12/19/2006  . PERIPHERAL VASCULAR DISEASE 12/19/2006    Orientation RESPIRATION BLADDER Height & Weight     Self, Time, Situation, Place  Normal Continent Weight: 167 lb 15.9 oz (76.2 kg) Height:  5\' 6"  (167.6 cm)  BEHAVIORAL SYMPTOMS/MOOD NEUROLOGICAL BOWEL NUTRITION STATUS      Continent Diet  AMBULATORY STATUS COMMUNICATION OF NEEDS Skin   Extensive Assist Verbally Surgical wounds(r leg) above knee amputation                       Personal Care Assistance Level of Assistance  Bathing, Feeding, Dressing  Ambulatory Bathing Assistance: Maximum assistance Feeding assistance: Independent Dressing Assistance: Maximum assistance  Ambulatory Assistance: Maximum assistance   Functional Limitations Info  Sight, Hearing, Speech Sight Info: Adequate Hearing Info: Adequate Speech Info: Adequate    SPECIAL CARE FACTORS FREQUENCY  PT (By licensed PT), OT (By licensed OT)     PT Frequency: 5x week OT Frequency: 5x week            Contractures Contractures Info: Not present    Additional Factors Info  Code Status, Allergies Code Status Info: Full Code Allergies Info: BIDIL ISOSORB DINITRATE-HYDRALAZINE            Current Medications (12/15/2017):  This is the current hospital active medication list Current Facility-Administered Medications  Medication Dose Route Frequency Provider Last Rate Last Dose  . 0.9 %  sodium chloride infusion  250 mL Intravenous PRN Elam Dutch, MD      .  0.9 %  sodium chloride infusion   Intravenous Continuous Gabriel Earing, PA-C   Stopped at 12/13/17 0436  . acetaminophen (TYLENOL) tablet 325-650 mg  325-650 mg Oral Q4H PRN Gabriel Earing, PA-C   650 mg at 12/14/17 1134   Or  . acetaminophen (TYLENOL) suppository 325-650 mg  325-650 mg Rectal Q4H PRN Rhyne, Samantha J, PA-C      . amLODipine (NORVASC) tablet  10 mg  10 mg Oral Daily Reubin Milan, MD   10 mg at 12/15/17 0946  . aspirin EC tablet 81 mg  81 mg Oral Daily Reubin Milan, MD   81 mg at 12/15/17 3016  . cloNIDine (CATAPRES) tablet 0.1 mg  0.1 mg Oral Q4H PRN Lavina Hamman, MD   0.1 mg at 12/08/17 1317  . feeding supplement (GLUCERNA SHAKE) (GLUCERNA SHAKE) liquid 237 mL  237 mL Oral BID BM Orson Eva, MD   237 mL at 12/14/17 0925  . feeding supplement (PRO-STAT SUGAR FREE 64) liquid 30 mL  30 mL Oral BID Tat, David, MD   30 mL at 12/15/17 0946  . furosemide (LASIX) tablet 40 mg  40 mg Oral Daily Reubin Milan, MD   40 mg at 12/15/17 0946  . gabapentin (NEURONTIN) capsule 100 mg  100 mg Oral TID Lavina Hamman, MD   100 mg at 12/15/17 1717  . guaiFENesin-dextromethorphan (ROBITUSSIN DM) 100-10 MG/5ML syrup 5 mL  5 mL Oral Q4H PRN Reubin Milan, MD      . heparin injection 5,000 Units  5,000 Units Subcutaneous Q8H Gabriel Earing, PA-C   5,000 Units at 12/15/17 1329  . insulin aspart (novoLOG) injection 0-5 Units  0-5 Units Subcutaneous QHS Lavina Hamman, MD   5 Units at 12/13/17 2100  . insulin aspart (novoLOG) injection 0-9 Units  0-9 Units Subcutaneous TID WC Lavina Hamman, MD   2 Units at 12/15/17 1718  . insulin glargine (LANTUS) injection 25 Units  25 Units Subcutaneous q morning - 10a Velvet Bathe, MD   25 Units at 12/15/17 1000  . labetalol (NORMODYNE,TRANDATE) injection 10 mg  10 mg Intravenous Q10 min PRN Elam Dutch, MD   10 mg at 12/08/17 1213  . lisinopril (PRINIVIL,ZESTRIL) tablet 20 mg  20 mg Oral Daily Reubin Milan, MD   20 mg at 12/15/17 0946  . metoprolol tartrate (LOPRESSOR) tablet 100 mg  100 mg Oral BID Reubin Milan, MD   100 mg at 12/15/17 0946  . morphine 4 MG/ML injection 1 mg  1 mg Intravenous Q1H PRN Serafina Mitchell, MD   1 mg at 12/14/17 1345  . nitroGLYCERIN (NITROSTAT) SL tablet 0.4 mg  0.4 mg Sublingual Q5 min PRN Reubin Milan, MD   0.4 mg at 12/13/17 0021   . ondansetron (ZOFRAN) injection 4 mg  4 mg Intravenous Q6H PRN Rhyne, Samantha J, PA-C      . oxyCODONE (Oxy IR/ROXICODONE) immediate release tablet 10 mg  10 mg Oral Q4H PRN Serafina Mitchell, MD   10 mg at 12/15/17 1455  . polyethylene glycol (MIRALAX / GLYCOLAX) packet 17 g  17 g Oral Daily Tat, David, MD   17 g at 12/15/17 0947  . sodium chloride flush (NS) 0.9 % injection 3 mL  3 mL Intravenous Q12H Elam Dutch, MD   3 mL at 12/15/17 0949  . sodium chloride flush (NS) 0.9 % injection 3 mL  3 mL Intravenous PRN Fields,  Jessy Oto, MD      . timolol (TIMOPTIC) 0.25 % ophthalmic solution 1 drop  1 drop Left Eye q morning - 10a Reubin Milan, MD   1 drop at 12/14/17 0919  . zolpidem (AMBIEN) tablet 5 mg  5 mg Oral QHS PRN Lavina Hamman, MD   5 mg at 12/14/17 0217     Discharge Medications: Please see discharge summary for a list of discharge medications.  Relevant Imaging Results:  Relevant Lab Results:   Additional Information SS# [place], [place]

## 2017-12-15 NOTE — Progress Notes (Signed)
   VASCULAR SURGERY ASSESSMENT & PLAN:   3 Days Post-Op s/p: R AKA  Wants to do Rehab closer to [place].   SUBJECTIVE:   No complaints.   PHYSICAL EXAM:   Vitals:   12/14/17 1517 12/14/17 2150 12/15/17 0451 12/15/17 0943  BP: (!) 136/54 (!) 159/62 (!) 174/61 (!) 162/63  Pulse: 64 97 65 66  Resp: 20 18 16    Temp: (!) 97.5 F (36.4 C) 98.9 F (37.2 C) 98.7 F (37.1 C)   TempSrc: Oral Oral Oral   SpO2: 97% 100% 100% 100%  Weight:      Height:       Dressing/ stump shrinker Right AKA is dry  LABS:   Lab Results  Component Value Date   WBC 11.7 (H) 12/15/2017   HGB 8.7 (L) 12/15/2017   HCT 27.8 (L) 12/15/2017   MCV 96.2 12/15/2017   PLT 271 12/15/2017   Lab Results  Component Value Date   CREATININE 0.78 12/15/2017   Lab Results  Component Value Date   INR 1.16 12/12/2017   CBG (last 3)  Recent Labs    12/14/17 2147 12/15/17 0813 12/15/17 1156  GLUCAP 162* 242* 266*    PROBLEM LIST:    Principal Problem:   Diabetic foot infection ([place]) Active Problems:   Dyslipidemia   Unspecified glaucoma   PERIPHERAL VASCULAR DISEASE   DM type 2 (diabetes mellitus, type 2) (HCC)   HTN (hypertension), benign   Bradycardia   History of atherosclerotic cardiovascular disease   Pain of right lower extremity   Type 2 diabetes mellitus with peripheral neuropathy (HCC)   Benign essential HTN   Coronary artery disease involving coronary bypass graft of native heart without angina pectoris   Chronic combined systolic and diastolic congestive heart failure (HCC)   Leukocytosis   Transaminitis   Unilateral AKA, right (HCC)   Cardiac arrest (HCC)   CURRENT MEDS:   . amLODipine  10 mg Oral Daily  . aspirin EC  81 mg Oral Daily  . feeding supplement (GLUCERNA SHAKE)  237 mL Oral BID BM  . feeding supplement (PRO-STAT SUGAR FREE 64)  30 mL Oral BID  . furosemide  40 mg Oral Daily  . gabapentin  100 mg Oral TID  . heparin  5,000 Units Subcutaneous Q8H  .  insulin aspart  0-5 Units Subcutaneous QHS  . insulin aspart  0-9 Units Subcutaneous TID WC  . insulin glargine  25 Units Subcutaneous q morning - 10a  . lisinopril  20 mg Oral Daily  . metoprolol tartrate  100 mg Oral BID  . polyethylene glycol  17 g Oral Daily  . sodium chloride flush  3 mL Intravenous Q12H  . timolol  1 drop Left Eye q morning - [place]: [phone] Office: [phone] 12/15/2017

## 2017-12-15 NOTE — Progress Notes (Signed)
Inpatient Diabetes Program Recommendations  AACE/ADA: New Consensus Statement on Inpatient Glycemic Control (2015)  Target Ranges:  Prepandial:   less than 140 mg/dL      Peak postprandial:   less than 180 mg/dL (1-2 hours)      Critically ill patients:  140 - 180 mg/dL  Results for TADEN, WITTER (MRN [id number]) as of 12/15/2017 14:16  Ref. Range 12/14/2017 00:09 12/14/2017 08:45 12/14/2017 12:21 12/14/2017 17:37 12/14/2017 21:47 12/15/2017 08:13 12/15/2017 11:56  Glucose-Capillary Latest Ref Range: 65 - 99 mg/dL 290 (H) 330 (H) 301 (H) 229 (H) 162 (H) 242 (H) 266 (H)    Review of Glycemic Control  Current orders for Inpatient glycemic control: Lantus 25 units QHS, Novolog 0-9 units TID with meals, Novolog 0-5 units QHS  Inpatient Diabetes Program Recommendations: Insulin - Meal Coverage: Please consider ordering Novolog 4 units TID with meals for meal coverage if patient eats at least 50% of meals.   NOTE: In reviewing chart, noted patient received Decadron 10 mg on 12/12/17 which is contributing to hyperglycemia.   Thanks, Barnie Alderman, RN, MSN, CDE Diabetes Coordinator Inpatient Diabetes Program [phone] (Team Pager from 8am to 5pm)

## 2017-12-15 NOTE — Consult Note (Signed)
Physical Medicine and Rehabilitation Consult  Reason for Consult: Functional deficits due to R-AKA Referring Physician: Dr. Trula Slade.    HPI: Marcus Hendrix is a 76 y.o. male with history of T2DM with peripheral neuropathy and diabetic foot ulcer, PVD, HTN, CAD s/p CABG, MVR,  recent admission for acute CHF; who was admitted on 12/05/17 with severe right foot pain with coolness and multiple blisters. History taken from chart review and patient. MRI right foot felt without osteomyelitis but felt to be due to systemic global phenomenon--question of vascular insufficiency v/s RSD. He was started on IV heparin due to concerns of critical limb ischemia and underwent stenting of right and left common iliac as well as right external iliac by Dr. Oneida Alar on 11/24. He continued to have persistent rest pain with coolness and underwent R-AKA on 12/28 by Dr. Trula Slade. Post op course complicated by cardiac arrest and recovered pulse with a couple of minutes of CPR.  Therapy evaluations done this weekend and patient limited by lethargy as well as difficulty clearing LLE. CIR was recommended due to functional deficits.   [employer] consulted for stump sock.    Review of Systems  Constitutional: Negative for chills and fever.  HENT: Negative for hearing loss and tinnitus.   Eyes: Negative for double vision.       Blind in left eye.  Respiratory: Positive for shortness of breath. Negative for cough.   Cardiovascular: Positive for chest pain (with activity--better with rest. ). Negative for palpitations.  Gastrointestinal: Negative for heartburn and nausea.  Genitourinary: Negative for dysuria and urgency.  Musculoskeletal: Positive for back pain (when up) and joint pain (bilateral hip pain and pain all over).  Neurological: Positive for sensory change and weakness. Negative for dizziness and headaches (occasionally).  Psychiatric/Behavioral: Negative for memory loss.  All other systems reviewed and are  negative.     Past Medical History:  Diagnosis Date  . Cerebrovascular disease   . Diabetes mellitus   . Diabetes mellitus ([place])   . ED (erectile dysfunction)   . Glaucoma, left eye   . History of atherosclerotic cardiovascular disease   . History of myocardial infarction   . Hyperlipidemia   . Hypertension   . Inguinal hernia    Right  . Normocytic anemia   . Peripheral neuropathy   . PVD (peripheral vascular disease) ([place])   . Tubulovillous adenoma of colon     Past Surgical History:  Procedure Laterality Date  . AMPUTATION Right 12/12/2017   Procedure: AMPUTATION ABOVE KNEE RIGHT;  Surgeon: Serafina Mitchell, MD;  Location: [facility];  Service: Vascular;  Laterality: Right;  . CHOLECYSTECTOMY    . COLONOSCOPY [name] 07/22/2014   Procedure: COLONOSCOPY;  Surgeon: [name], MD;  [facility];  Service: Endoscopy;  Laterality: [name];  1235-moved to [place] notified pt  . CORONARY ARTERY BYPASS GRAFT  2002   x6  . INGUINAL HERNIA REPAIR  2004   Left  . LOWER EXTREMITY ANGIOGRAPHY [name] 12/08/2017   Procedure: LOWER EXTREMITY ANGIOGRAPHY;  Surgeon: Elam Dutch, MD;  Location: [facility];  Service: Cardiovascular;  Laterality: [name];  . PERIPHERAL VASCULAR INTERVENTION Bilateral 12/08/2017   Procedure: PERIPHERAL VASCULAR INTERVENTION;  Surgeon: Elam Dutch, MD;  Location: [facility];  Service: Cardiovascular;  Laterality: Bilateral;  [place]    Family History  Problem Relation Age of Onset  . Heart attack Mother   . Stroke Father   .  Diabetes Sister   . HIV Brother   . Alcohol abuse Brother   . Diabetes Sister   . Heart attack Son     Social History:  Married. 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 Lives with family and was independent with [place] [employer] but sedentary. reports that he quit smoking about 20 years ago. His smoking use included cigarettes. He started smoking about 60 years ago. He has a 41.00 pack-year  smoking history. His smokeless tobacco use includes chew. He reports that he does not drink alcohol or use drugs.    Allergies  Allergen Reactions  . Bidil [Isosorb Dinitrate-Hydralazine]     PASSED OUT BUT NOT SURE IF THIS WAS THE CAUSE    Medications Prior to Admission  Medication Sig Dispense Refill  . amLODipine (NORVASC) 10 MG tablet Take 10 mg by mouth daily.      . [EXPIRED] amoxicillin-clavulanate (AUGMENTIN) 875-125 MG tablet Take 1 tablet by mouth every 12 (twelve) hours for 10 days. 20 tablet 0  . aspirin 81 MG tablet Take 81 mg by mouth daily.    [name] atorvastatin (LIPITOR) 80 MG tablet TAKE ONE TABLET BY MOUTH ONCE DAILY. 15 tablet 0  . furosemide (LASIX) 40 MG tablet Take 40 mg by mouth daily.      [name] gabapentin (NEURONTIN) 100 MG capsule Take 100-200 mg by mouth 2 (two) times daily.   10  . guaiFENesin-dextromethorphan (ROBITUSSIN DM) 100-10 MG/5ML syrup Take 5 mLs by mouth every 4 (four) hours as needed for cough. 118 mL 0  . insulin glargine (LANTUS) 100 UNIT/ML injection Inject 50 Units into the skin every morning.     [name] lisinopril (PRINIVIL,ZESTRIL) 20 MG tablet TAKE ONE TABLET BY MOUTH TWICE DAILY. (Patient taking differently: TAKE ONE TABLET BY MOUTH DAILY) 90 tablet 3  . metoprolol (LOPRESSOR) 100 MG tablet Take 100 mg by mouth 2 (two) times daily.      . nitroGLYCERIN (NITROSTAT) 0.4 MG SL tablet Place 0.4 mg under the tongue every 5 (five) minutes as needed for chest pain.   10  . oxyCODONE-acetaminophen (PERCOCET/ROXICET) 5-325 MG tablet Take 1 tablet by mouth every 6 (six) hours as needed for severe pain. 12 tablet 0  . polyethylene glycol powder (GLYCOLAX/MIRALAX) powder Take 17 g by mouth daily. 255 g 0  . potassium chloride SA (K-DUR,KLOR-CON) 20 MEQ tablet Take 20 mEq by mouth daily.    . [EXPIRED] sulfamethoxazole-trimethoprim (BACTRIM DS,SEPTRA DS) 800-160 MG tablet Take 1 tablet by mouth 2 (two) times daily for 10 days. 20 tablet 0  . timolol (TIMOPTIC) 0.25 %  ophthalmic solution Place 1 drop into the left eye every morning.     . vitamin B-12 (CYANOCOBALAMIN) 1000 MCG tablet Take 2,000 mcg by mouth daily.      Home: Home Living Family/patient expects to be discharged to:: Private residence Living Arrangements: Spouse/significant other, Other relatives(67+ year old grandson) Available Help at Discharge: Family, Available 24 hours/day Type of Home: House Home Access: Level entry Home Layout: One level Bathroom Shower/Tub: Chiropodist: Standard Home Equipment: Cane - single point  Functional History: Prior Function Level of Independence: Independent with assistive device(s) Comments: ambulated with use of SPC Functional Status:  Mobility: Bed Mobility Overal bed mobility: Needs Assistance Bed Mobility: Supine to Sit Supine to sit: Mod assist, +2 for safety/equipment, HOB elevated General bed mobility comments: increased time and effort with cues for technique, assist for lifting trunk and scooting to EOB Transfers  Overall transfer level: Needs assistance Equipment used: Rolling walker (2 wheeled) Transfers: Sit to/from Stand, Stand Pivot Transfers Sit to Stand: +2 physical assistance, +2 safety/equipment, Mod assist, From elevated surface Stand pivot transfers: Mod assist, +2 physical assistance, +2 safety/equipment General transfer comment: increased time and effort, cueing for technique, assist for stability with transition into standing from EOB and with pivotal movements. Pt with minimal to zero foot clearance on L LE when attempting to hop; heavy lowering assist for safety into chair      ADL: ADL Overall ADL's : Needs assistance/impaired Eating/Feeding: Set up Grooming: Wash/dry hands, Wash/dry face, Set up, Sitting Grooming Details (indicate cue type and reason): in recliner Upper Body Bathing: Moderate assistance Upper Body Bathing Details (indicate cue type and reason): for back Lower Body Bathing:  Moderate assistance, Sitting/lateral leans Lower Body Bathing Details (indicate cue type and reason): decreased balance for accessing LB Upper Body Dressing : Minimal assistance Lower Body Dressing: Maximal assistance, +2 for physical assistance, +2 for safety/equipment, Sit to/from stand Toilet Transfer: Moderate assistance, +2 for physical assistance, +2 for safety/equipment, Stand-pivot, Cueing for sequencing, BSC, RW Toilet Transfer Details (indicate cue type and reason): vc throughout, transferred to the left Toileting- Clothing Manipulation and Hygiene: Maximal assistance, +2 for physical assistance, +2 for safety/equipment, Sit to/from stand Functional mobility during ADLs: Moderate assistance, +2 for physical assistance, +2 for safety/equipment, Rolling walker, Cueing for sequencing  Cognition: Cognition Overall Cognitive Status: No family/caregiver present to determine baseline cognitive functioning Orientation Level: Oriented X4 Cognition Arousal/Alertness: Suspect due to medications, Lethargic Behavior During Therapy: Flat affect Overall Cognitive Status: No family/caregiver present to determine baseline cognitive functioning General Comments: Pt able to follow one step commands - required vc to open eyes    Blood pressure (!) 162/63, pulse 66, temperature 98.7 F (37.1 C), temperature source Oral, resp. rate 16, height 5\' 6"  (1.676 m), weight 76.2 kg (167 lb 15.9 oz), SpO2 100 %. Physical Exam  Nursing note and vitals reviewed. Constitutional: He appears well-developed and well-nourished. No distress.  HENT:  Head: Normocephalic and atraumatic.  Eyes: Conjunctivae and EOM are normal. Pupils are equal, round, and reactive to light. Right eye exhibits no discharge. Left eye exhibits no discharge. No scleral icterus.  Neck: Normal range of motion. Neck supple.  Cardiovascular: Normal rate and regular rhythm.  Murmur heard. Respiratory: Effort normal and breath sounds normal.  No stridor. No respiratory distress. He has no wheezes.  +[place]  GI: Soft. Bowel sounds are normal. He exhibits no distension.  Musculoskeletal: He exhibits edema and tenderness.  Right AKA  Neurological: He is alert.  Dysphonia.  Keeps eyes closed.   Mildly disoriented--he thought that he was here due to heart attack.  Able to follow simple commands Motor: B/l UE: 4+/5 proximal to distal LLE: HF 2/5, KE 3/5, ADF/PF 4/5 RLE: HF 2+/5 (pain inhibition)  Skin: Skin is warm and dry. He is not diaphoretic.  Psychiatric: He has a normal mood and affect. He is slowed.    Results for orders placed or performed during the hospital encounter of 12/05/17 (from the past 24 hour(s))  Glucose, capillary     Status: Abnormal   Collection Time: 12/14/17 12:21 PM  Result Value Ref Range   Glucose-Capillary 301 (H) 65 - 99 mg/dL  Glucose, capillary     Status: Abnormal   Collection Time: 12/14/17  5:37 PM  Result Value Ref Range   Glucose-Capillary 229 (H) 65 - 99 mg/dL  Glucose, capillary  Status: Abnormal   Collection Time: 12/14/17  9:47 PM  Result Value Ref Range   Glucose-Capillary 162 (H) 65 - 99 mg/dL  CBC WITH DIFFERENTIAL     Status: Abnormal   Collection Time: 12/15/17  3:47 AM  Result Value Ref Range   WBC 11.7 (H) 4.0 - 10.5 K/uL   RBC 2.89 (L) 4.22 - 5.81 MIL/uL   Hemoglobin 8.7 (L) 13.0 - 17.0 g/dL   HCT 27.8 (L) 39.0 - 52.0 %   MCV 96.2 78.0 - 100.0 fL   MCH 30.1 26.0 - 34.0 pg   MCHC 31.3 30.0 - 36.0 g/dL   RDW 14.1 11.5 - 15.5 %   Platelets 271 150 - 400 K/uL   Neutrophils Relative % 77 %   Lymphocytes Relative 15 %   Monocytes Relative 7 %   Eosinophils Relative 1 %   Basophils Relative 0 %   Neutro Abs 9.0 (H) 1.7 - 7.7 K/uL   Lymphs Abs 1.8 0.7 - 4.0 K/uL   Monocytes Absolute 0.8 0.1 - 1.0 K/uL   Eosinophils Absolute 0.1 0.0 - 0.7 K/uL   Basophils Absolute 0.0 0.0 - 0.1 K/uL   RBC Morphology POLYCHROMASIA PRESENT   Comprehensive metabolic panel     Status:  Abnormal   Collection Time: 12/15/17  3:47 AM  Result Value Ref Range   Sodium 136 135 - 145 mmol/L   Potassium 4.1 3.5 - 5.1 mmol/L   Chloride 101 101 - 111 mmol/L   CO2 25 22 - 32 mmol/L   Glucose, Bld 188 (H) 65 - 99 mg/dL   BUN 19 6 - 20 mg/dL   Creatinine, Ser 0.78 0.61 - 1.24 mg/dL   Calcium 8.1 (L) 8.9 - 10.3 mg/dL   Total Protein 5.9 (L) 6.5 - 8.1 g/dL   Albumin 2.6 (L) 3.5 - 5.0 g/dL   AST 118 (H) 15 - 41 U/L   ALT 101 (H) 17 - 63 U/L   Alkaline Phosphatase 170 (H) 38 - 126 U/L   Total Bilirubin 1.4 (H) 0.3 - 1.2 mg/dL   GFR calc non Af Amer >60 >60 mL/min   GFR calc Af Amer >60 >60 mL/min   Anion gap 10 5 - 15  Glucose, capillary     Status: Abnormal   Collection Time: 12/15/17  8:13 AM  Result Value Ref Range   Glucose-Capillary 242 (H) 65 - 99 mg/dL   No results found.  Assessment/Plan: Diagnosis: Right AKA Labs independently reviewed.  Records reviewed and summated above. Clean amputation daily with soap and water Monitor incision site for signs of infection or impending skin breakdown. Staples to remain in place for 3-4 weeks Stump shrinker, for edema control  Scar mobilization massaging to prevent soft tissue adherence Stump protector during therapies Prevent flexion contractures by implementing the following:   Encourage prone lying for 20-30 mins per day BID to avoid hip flexion  Contractures if medically appropriate;  Avoid prolonged sitting Post surgical pain control with oral medication Phantom limb pain control with physical modalities including desensitization techniques (gentle self massage to the residual stump,hot packs if sensation intact, [place]) and mirror therapy, TENS. If ineffective, consider pharmacological treatment for neuropathic pain (e.g gabapentin, pregabalin, amytriptalyine, duloxetine).  Avoid injury to contralateral side  1. Does the need for close, 24 hr/day medical supervision in concert with the patient's rehab needs make it  unreasonable for this patient to be served in a less intensive setting? Yes  2. Co-Morbidities requiring supervision/potential complications:  T2 DM with peripheral neuropathy (Monitor in accordance with exercise and adjust meds as necessary), PVD (cont meds), HTN (monitor and provide prns in accordance with increased physical exertion and pain), CAD s/p CABG, MVR,  CHF (Monitor in accordance with increased physical activity and avoid UE resistance excercises), leukocytosis (cont to monitor for signs and symptoms of infection, further workup if indicated), transaminitis (cont to monitor, evaluate meds) 3. Due to safety, skin/wound care, disease management, pain management and patient education, does the patient require 24 hr/day rehab nursing? Yes 4. Does the patient require coordinated care of a physician, rehab nurse, PT (1-2 hrs/day, 5 days/week) and OT (1-2 hrs/day, 5 days/week) to address physical and functional deficits in the context of the above medical diagnosis(es)? Yes Addressing deficits in the following areas: balance, endurance, locomotion, strength, transferring, bathing, dressing, toileting and psychosocial support 5. Can the patient actively participate in an intensive therapy program of at least 3 hrs of therapy per day at least 5 days per week? Yes 6. The potential for patient to make measurable gains while on inpatient rehab is excellent 7. Anticipated functional outcomes upon discharge from inpatient rehab are modified independent  with PT, modified independent with OT, n/a with SLP. 8. Estimated rehab length of stay to reach the above functional goals is: 5-7 days. 9. Anticipated D/C setting: Home 10. Anticipated post D/C treatments: HH therapy and Home excercise program 11. Overall Rehab/Functional Prognosis: good  RECOMMENDATIONS: This patient's condition is appropriate for continued rehabilitative care in the following setting: Pt has made significant gains in OT, recommend  reeval by PT.  If deficits persist, recommned CIR. Patient has agreed to participate in recommended program. Yes Note that insurance prior authorization may be required for reimbursement for recommended care.  Comment: Rehab Admissions Coordinator to follow up.  Delice Lesch, MD, ABPMR [facility], [place] 12/15/2017

## 2017-12-15 NOTE — Progress Notes (Signed)
Occupational Therapy Treatment Patient Details Name: Marcus Hendrix MRN: [id number] DOB: [birth year] Today's Date: 12/15/2017    History of present illness Pt is a 76 y/o male s/p R transfemoral amputation. PMH including but not limited to DM, HTN, HLD, PVD. Of note, following surgery after return to floor pt coded CPR was performed for two minutes and pt revived.   OT comments  Pt progressing well. Performed bed mobility with verbal cues and min guard assist and squat pivot to chair with min assist. Worked on sitting balance at EOB. Tolerated activity well.   Follow Up Recommendations  CIR;Supervision/Assistance - 24 hour    Equipment Recommendations       Recommendations for Other Services      Precautions / Restrictions Precautions Precautions: Fall Precaution Comments: new Right AKA Restrictions Weight Bearing Restrictions: Yes RLE Weight Bearing: Non weight bearing       Mobility Bed Mobility Overal bed mobility: Needs Assistance Bed Mobility: Supine to Sit     Supine to sit: Min guard     General bed mobility comments: increased time, cues for technique  Transfers Overall transfer level: Needs assistance Equipment used: 1 person hand held assist Transfers: Squat Pivot Transfers     Squat pivot transfers: Min assist     General transfer comment: cues for technique, assist to rise and for balance, pt able to pivot on L foot    Balance Overall balance assessment: Needs assistance   Sitting balance-Leahy Scale: Fair Sitting balance - Comments: worked on wide range reaching     Standing balance-Leahy Scale: Poor Standing balance comment: reliant on bilateral UEs on RW                           ADL either performed or assessed with clinical judgement   ADL       Grooming: Sitting;Set up Grooming Details (indicate cue type and reason): in recliner                             Functional mobility during ADLs: Minimal  assistance(pivot to chair)       Vision       Perception     Praxis      Cognition Arousal/Alertness: Awake/alert Behavior During Therapy: WFL for tasks assessed/performed Overall Cognitive Status: No family/caregiver present to determine baseline cognitive functioning                                 General Comments: pt with episodes of confusion per nursing staff, anticipates going home today        Exercises Exercises: Other exercises Other Exercises Other Exercises: chair pushups x 10   Shoulder Instructions       General Comments      Pertinent Vitals/ Pain       Pain Assessment: Faces Faces Pain Scale: Hurts little more Pain Location: hips Pain Descriptors / Indicators: Sore;Grimacing Pain Intervention(s): Monitored during session;Repositioned  Home Living                                          Prior Functioning/Environment              Frequency  Min 3X/week  Progress Toward Goals  OT Goals(current goals can now be found in the care plan section)  Progress towards OT goals: Not progressing toward goals - comment  Acute Rehab OT Goals Patient Stated Goal: to get stronger OT Goal Formulation: With patient Time For Goal Achievement: 12/28/17 Potential to Achieve Goals: Good  Plan Discharge plan remains appropriate    Co-evaluation                 AM-PAC PT "6 Clicks" Daily Activity     Outcome Measure   Help from another person eating meals?: None Help from another person taking care of personal grooming?: A Little Help from another person toileting, which includes using toliet, bedpan, or urinal?: A Lot Help from another person bathing (including washing, rinsing, drying)?: A Lot Help from another person to put on and taking off regular upper body clothing?: A Little Help from another person to put on and taking off regular lower body clothing?: A Lot 6 Click Score: 16    End of  Session Equipment Utilized During Treatment: Gait belt;Rolling walker  OT Visit Diagnosis: Unsteadiness on feet (R26.81);Other abnormalities of gait and mobility (R26.89);Pain   Activity Tolerance Patient tolerated treatment well   Patient Left in chair;with call bell/phone within reach;with chair alarm set   Nurse Communication          Time: 7829-5621 OT Time Calculation (min): 20 min  Charges: OT General Charges $OT Visit: 1 Visit OT Treatments $Therapeutic Activity: 8-22 mins  12/15/2017 Nestor Lewandowsky, OTR/L Pager: [phone]   Werner Lean Haze [place] 12/15/2017, 12:01 PM

## 2017-12-15 NOTE — Telephone Encounter (Signed)
Sched appt 01/12/18 at 4:00. Spoke to pt's wife.

## 2017-12-15 NOTE — Progress Notes (Signed)
Physical Therapy Treatment Patient Details Name: Marcus Hendrix MRN: [id number] DOB: [birth year] Today's Date: 12/15/2017    History of Present Illness Pt is a 76 y/o male s/p R transfemoral amputation. PMH including but not limited to DM, HTN, HLD, PVD. Of note, following surgery after return to floor pt coded CPR was performed for two minutes and pt revived.    PT Comments    Continuing work on functional mobility and activity tolerance;  Able to take a few steps on LLE before fatiguing; Good improvements noted; Noted also family is choosing for post-acute rehab at SNF; will update POC accordingly   Follow Up Recommendations  SNF;Supervision/Assistance - 24 hour     Equipment Recommendations  Rolling walker with 5" wheels;Wheelchair (measurements PT);Wheelchair cushion (measurements PT)    Recommendations for Other Services      Precautions / Restrictions Precautions Precautions: Fall Precaution Comments: new Right AKA Restrictions RLE Weight Bearing: Non weight bearing    Mobility  Bed Mobility Overal bed mobility: Needs Assistance Bed Mobility: Supine to Sit     Supine to sit: Min guard     General bed mobility comments: increased time, cues for technique  Transfers Overall transfer level: Needs assistance Equipment used: Rolling walker (2 wheeled) Transfers: Sit to/from Stand Sit to Stand: +2 safety/equipment;Mod assist         General transfer comment: Cues for hand placmeent and safety; mod assist to rise and to steady while pt moved hands form bed to RW  Ambulation/Gait Ambulation/Gait assistance: Min assist;Mod assist;+2 safety/equipment Ambulation Distance (Feet): 6 Feet Assistive device: Rolling walker (2 wheeled) Gait Pattern/deviations: (Hop-to)     General Gait Details: Cues for technqiue, and for posture; Excellent first few steps, but then noted fatigue; chair pulled behind for safety   Stairs            Wheelchair Mobility     Modified Rankin (Stroke Patients Only)       Balance     Sitting balance-Leahy Scale: Fair       Standing balance-Leahy Scale: Poor Standing balance comment: Noted tends to fatigue into knee, hip, and trunk flexion with incr time standing                            Cognition Arousal/Alertness: Awake/alert Behavior During Therapy: WFL for tasks assessed/performed Overall Cognitive Status: No family/caregiver present to determine baseline cognitive functioning                                        Exercises      General Comments        Pertinent Vitals/Pain Pain Assessment: Faces Faces Pain Scale: Hurts little more Pain Location: RLE Pain Descriptors / Indicators: Sore;Grimacing Pain Intervention(s): Monitored during session    Home Living                      Prior Function            PT Goals (current goals can now be found in the care plan section) Acute Rehab PT Goals Patient Stated Goal: to get stronger PT Goal Formulation: With patient Time For Goal Achievement: 12/27/17 Potential to Achieve Goals: Good Progress towards PT goals: Progressing toward goals    Frequency    Min 4X/week      PT Plan Current plan  remains appropriate;Frequency needs to be updated    Co-evaluation              AM-PAC PT "6 Clicks" Daily Activity  Outcome Measure  Difficulty turning over in bed (including adjusting bedclothes, sheets and blankets)?: A Little Difficulty moving from lying on back to sitting on the side of the bed? : A Lot Difficulty sitting down on and standing up from a chair with arms (e.g., wheelchair, bedside commode, etc,.)?: Unable Help needed moving to and from a bed to chair (including a wheelchair)?: A Lot Help needed walking in hospital room?: A Lot Help needed climbing 3-5 steps with a railing? : Total 6 Click Score: 11    End of Session Equipment Utilized During Treatment: Gait belt Activity  Tolerance: Patient tolerated treatment well Patient left: in chair;with call bell/phone within reach;with chair alarm set Nurse Communication: Mobility status PT Visit Diagnosis: Other abnormalities of gait and mobility (R26.89)     Time: 0511-0211 PT Time Calculation (min) (ACUTE ONLY): 17 min  Charges:  $Gait Training: 8-22 mins                    G Codes:       Roney Marion, PT  [place] Pager [phone] Office [place] 12/15/2017, 4:55 PM

## 2017-12-15 NOTE — Progress Notes (Signed)
I met with pt and his wife at bedside to discuss their rehab venue options. Pt and wife confirm that they would like to do do his rehab at the [facility] center SNF, not here at [facility]. I have notified RN CM as well as SW. We will sign off at this time. [phone]

## 2017-12-15 NOTE — Progress Notes (Signed)
PROGRESS NOTE    Marcus Hendrix  HQI:[id number] DOB: [birth year] DOA: 12/05/2017 PCP: [name], MD   Brief Narrative: Marcus Hendrix is a 76 y.o. male with a history of diabetes mellitus, coronary artery disease, hyperlipidemia, hypertension, peripheral vascular disease, presenting with approximately 3-week history of right foo swelling, and drainage.  Patient does not recall any recent injury.  The patient was actually admitted to the hospital from November 28, 2017 through November 29, 2017 for chest pain during which time the patient was also treated for cellulitis of the right lower extremity.  He was discharged home with Augmentin and endorsed compliance with the medication.  He went to see his primary care provider on December 05, 2017 who noted increasing pain, edema and erythema.  As result, the patient was sent to the emergency department for further evaluation.  He states that right foot pain has been his major complaint for the past week.  He denies any fevers, chills, chest pain, shortness breath, coughing, hemoptysis, nausea, vomiting, diarrhea.  At the time of admission, WBC was 7.9, and the patient was afebrile hemodynamically stable.  Upon evaluation on the morning of December 06, 2017, the patient's right foot was noted to be cool to the touch.  In addition, the patient stated that for the past week he has had 2 dangle his right lower extremity to gravity to get relief of his pain.  He has also noted some claudication type symptoms.  Pt is s/p R AKA. Vascular surgeon following   Assessment & Plan:   Principal Problem:   Diabetic foot infection ([place]) Active Problems:   Dyslipidemia   Unspecified glaucoma   PERIPHERAL VASCULAR DISEASE   DM type 2 (diabetes mellitus, type 2) (HCC)   HTN (hypertension), benign   Bradycardia   History of atherosclerotic cardiovascular disease   Pain of right lower extremity   Type 2 diabetes mellitus with peripheral neuropathy  (HCC)   Benign essential HTN   Coronary artery disease involving coronary bypass graft of native heart without angina pectoris   Chronic combined systolic and diastolic congestive heart failure (HCC)   Leukocytosis   Transaminitis   Unilateral AKA, right ([place])   Cardiac arrest ([place])   Ischemia to the right foot Patient is s/p iliac stent and AKA on 12/28. Doing well.  Plan for CIR vs SNF. -vascular surgery recommendations  Diabetic foot ulcer Patient is s/p AKA. Antibiotics discontinued.  Diabetes mellitus, type 2 Uncontrolled with hyper- and hypoglycemia. Last hemoglobin A1C of 7.6. -Continue Lantus and SSI  Essential hypertension Slightly uncontrolled -Continue, clonidine, amlodipine and metoprolol  Chronic diastolic heart failure Last EF of 55% with grade 2 diastolic dysfunction. Stable. -Continue Lasix and metoprolol  Hyperlipidemia Statin held secondary to mild elevation of AST/ALT  Elevated transaminases Unknown etiology. No symptoms. Improving.  Coronary artery disease S/p CABG   DVT prophylaxis: Heparin subq Code Status: Full code Family Communication: None at bedside Disposition Plan: CIR vs SNF   Consultants:   Vascular surgery  Procedures:   Right AKA  Iliac stent  Antimicrobials:  None    Subjective: Patient reports no pain. Feels a "knot" on the medial aspect of his stump  Objective: Vitals:   12/14/17 2150 12/15/17 0451 12/15/17 0943 12/15/17 1455  BP: (!) 159/62 (!) 174/61 (!) 162/63 (!) 146/61  Pulse: 97 65 66 70  Resp: 18 16  17   Temp: 98.9 F (37.2 C) 98.7 F (37.1 C)  98.9 F (37.2 C)  TempSrc: Oral  Oral  Oral  SpO2: 100% 100% 100% 99%  Weight:      Height:        Intake/Output Summary (Last 24 hours) at 12/15/2017 1633 Last data filed at 12/15/2017 0835 Gross per 24 hour  Intake 440 ml  Output 450 ml  Net -10 ml   Filed Weights   12/05/17 1803 12/06/17 0033 12/12/17 1923  Weight: 83.9 kg (185 lb) 82.2 kg (181  lb 3.5 oz) 76.2 kg (167 lb 15.9 oz)    Examination:  General exam: Appears calm and comfortable Respiratory system: Clear to auscultation. Respiratory effort normal. Cardiovascular system: S1 & S2 heard, RRR. No murmurs, rubs, gallops or clicks. Gastrointestinal system: Abdomen is nondistended, soft and nontender. No organomegaly or masses felt. Normal bowel sounds heard. Central nervous system: Alert and oriented. No focal neurological deficits. Extremities: No edema. No calf tenderness, right AKA Skin: No cyanosis. No rashes Psychiatry: Judgement and insight appear normal. Mood & affect appropriate.     Data Reviewed: I have personally reviewed following labs and imaging studies  CBC: Recent Labs  Lab 12/12/17 0745 12/12/17 1914 12/13/17 0328 12/14/17 0350 12/15/17 0347  WBC 11.3* 15.5* 11.2* 16.5* 11.7*  NEUTROABS 8.5* 12.1* 10.3* 13.9* 9.0*  HGB 8.8* 8.7* 8.4* 8.7* 8.7*  HCT 27.8* 28.0* 26.7* 27.1* 27.8*  MCV 95.5 94.9 95.7 94.8 96.2  PLT 255 277 229 277 678   Basic Metabolic Panel: Recent Labs  Lab 12/12/17 0745 12/12/17 1914 12/13/17 0328 12/14/17 0350 12/15/17 0347  NA 133* 135 134* 133* 136  K 3.9 4.0 4.6 4.5 4.1  CL 97* 102 100* 99* 101  CO2 25 22 19* 25 25  GLUCOSE 172* 185* 297* 308* 188*  BUN 19 22* 26* 30* 19  CREATININE 1.07 1.34* 1.20 0.95 0.78  CALCIUM 8.1* 8.3* 7.9* 8.1* 8.1*   GFR: Estimated Creatinine Clearance: 70.9 mL/min (by C-G formula based on SCr of 0.78 mg/dL). Liver Function Tests: Recent Labs  Lab 12/12/17 1914 12/15/17 0347  AST 312* 118*  ALT 184* 101*  ALKPHOS 213* 170*  BILITOT 2.4* 1.4*  PROT 6.3* 5.9*  ALBUMIN 2.7* 2.6*   No results for input(s): LIPASE, AMYLASE in the last 168 hours. No results for input(s): AMMONIA in the last 168 hours. Coagulation Profile: Recent Labs  Lab 12/12/17 0745  INR 1.16   Cardiac Enzymes: No results for input(s): CKTOTAL, CKMB, CKMBINDEX, TROPONINI in the last 168 hours. BNP  (last 3 results) No results for input(s): PROBNP in the last 8760 hours. HbA1C: No results for input(s): HGBA1C in the last 72 hours. CBG: Recent Labs  Lab 12/14/17 1221 12/14/17 1737 12/14/17 2147 12/15/17 0813 12/15/17 1156  GLUCAP 301* 229* 162* 242* 266*   Lipid Profile: No results for input(s): CHOL, HDL, LDLCALC, TRIG, CHOLHDL, LDLDIRECT in the last 72 hours. Thyroid Function Tests: No results for input(s): TSH, T4TOTAL, FREET4, T3FREE, THYROIDAB in the last 72 hours. Anemia Panel: No results for input(s): VITAMINB12, FOLATE, FERRITIN, TIBC, IRON, RETICCTPCT in the last 72 hours. Sepsis Labs: No results for input(s): PROCALCITON, LATICACIDVEN in the last 168 hours.  Recent Results (from the past 240 hour(s))  MRSA PCR Screening     Status: None   Collection Time: 12/07/17  4:33 PM  Result Value Ref Range Status   MRSA by PCR NEGATIVE NEGATIVE Final    Comment:        The GeneXpert MRSA Assay (FDA approved for NASAL specimens only), is one component of a comprehensive MRSA  colonization surveillance program. It is not intended to diagnose MRSA infection nor to guide or monitor treatment for MRSA infections.          Radiology Studies: No results found.      Scheduled Meds: . amLODipine  10 mg Oral Daily  . aspirin EC  81 mg Oral Daily  . feeding supplement (GLUCERNA SHAKE)  237 mL Oral BID BM  . feeding supplement (PRO-STAT SUGAR FREE 64)  30 mL Oral BID  . furosemide  40 mg Oral Daily  . gabapentin  100 mg Oral TID  . heparin  5,000 Units Subcutaneous Q8H  . insulin aspart  0-5 Units Subcutaneous QHS  . insulin aspart  0-9 Units Subcutaneous TID WC  . insulin glargine  25 Units Subcutaneous q morning - 10a  . lisinopril  20 mg Oral Daily  . metoprolol tartrate  100 mg Oral BID  . polyethylene glycol  17 g Oral Daily  . sodium chloride flush  3 mL Intravenous Q12H  . timolol  1 drop Left Eye q morning - 10a   Continuous Infusions: . sodium  chloride    . sodium chloride Stopped (12/13/17 0436)     LOS: 10 days     Cordelia Poche, MD [facility] 12/15/2017, 4:33 PM Pager: [phone]  If 7PM-7AM, please contact night-coverage [email or web address] Password [facility] 12/15/2017, 4:33 PM

## 2017-12-16 ENCOUNTER — Inpatient Hospital Stay (HOSPITAL_COMMUNITY): Payer: Medicare Other

## 2017-12-16 LAB — CBC WITH DIFFERENTIAL/PLATELET
BASOS PCT: 0 %
Basophils Absolute: 0 10*3/uL (ref 0.0–0.1)
EOS ABS: 0.2 10*3/uL (ref 0.0–0.7)
EOS PCT: 2 %
HEMATOCRIT: 27.6 % — AB (ref 39.0–52.0)
HEMOGLOBIN: 8.9 g/dL — AB (ref 13.0–17.0)
LYMPHS PCT: 20 %
Lymphs Abs: 2.1 10*3/uL (ref 0.7–4.0)
MCH: 30.8 pg (ref 26.0–34.0)
MCHC: 32.2 g/dL (ref 30.0–36.0)
MCV: 95.5 fL (ref 78.0–100.0)
MONOS PCT: 7 %
Monocytes Absolute: 0.7 10*3/uL (ref 0.1–1.0)
NEUTROS PCT: 71 %
Neutro Abs: 7.5 10*3/uL (ref 1.7–7.7)
Platelets: 271 10*3/uL (ref 150–400)
RBC: 2.89 MIL/uL — ABNORMAL LOW (ref 4.22–5.81)
RDW: 14.2 % (ref 11.5–15.5)
WBC: 10.5 10*3/uL (ref 4.0–10.5)

## 2017-12-16 LAB — GLUCOSE, CAPILLARY
GLUCOSE-CAPILLARY: 68 mg/dL (ref 65–99)
Glucose-Capillary: 97 mg/dL (ref 65–99)
Glucose-Capillary: 203 mg/dL — ABNORMAL HIGH (ref 65–99)
Glucose-Capillary: 98 mg/dL (ref 65–99)
Glucose-Capillary: 81 mg/dL (ref 65–99)

## 2017-12-16 LAB — COMPREHENSIVE METABOLIC PANEL
ALK PHOS: 160 U/L — AB (ref 38–126)
ALT: 86 U/L — AB (ref 17–63)
AST: 100 U/L — ABNORMAL HIGH (ref 15–41)
Albumin: 2.5 g/dL — ABNORMAL LOW (ref 3.5–5.0)
Anion gap: 8 (ref 5–15)
BUN: 15 mg/dL (ref 6–20)
CALCIUM: 8.2 mg/dL — AB (ref 8.9–10.3)
CO2: 28 mmol/L (ref 22–32)
CREATININE: 0.82 mg/dL (ref 0.61–1.24)
Chloride: 102 mmol/L (ref 101–111)
GFR calc non Af Amer: >60 mL/min (ref >60)
Glucose, Bld: 100 mg/dL — ABNORMAL HIGH (ref 65–99)
Potassium: 4 mmol/L (ref 3.5–5.1)
SODIUM: 138 mmol/L (ref 135–145)
Total Bilirubin: 1.6 mg/dL — ABNORMAL HIGH (ref 0.3–1.2)
Total Protein: 6.2 g/dL — ABNORMAL LOW (ref 6.5–8.1)

## 2017-12-16 NOTE — Progress Notes (Addendum)
PROGRESS NOTE    Marcus STEPANEK  AJO:[id number] DOB: 10-14-1941 DOA: 12/05/2017 PCP: [name], MD   Brief Narrative: Marcus Hendrix is a 77 y.o. male with a history of diabetes mellitus, coronary artery disease, hyperlipidemia, hypertension, peripheral vascular disease, presenting with approximately 3-week history of right foo swelling, and drainage.  Patient does not recall any recent injury.  The patient was actually admitted to the hospital from November 28, 2017 through November 29, 2017 for chest pain during which time the patient was also treated for cellulitis of the right lower extremity.  He was discharged home with Augmentin and endorsed compliance with the medication.  He went to see his primary care provider on December 05, 2017 who noted increasing pain, edema and erythema.  As result, the patient was sent to the emergency department for further evaluation.  He states that right foot pain has been his major complaint for the past week.  He denies any fevers, chills, chest pain, shortness breath, coughing, hemoptysis, nausea, vomiting, diarrhea.  At the time of admission, WBC was 7.9, and the patient was afebrile hemodynamically stable.  Upon evaluation on the morning of December 06, 2017, the patient's right foot was noted to be cool to the touch.  In addition, the patient stated that for the past week he has had 2 dangle his right lower extremity to gravity to get relief of his pain.  He has also noted some claudication type symptoms.  Pt is s/p R AKA. Vascular surgeon following   Assessment & Plan:   Principal Problem:   Diabetic foot infection ([place]) Active Problems:   Dyslipidemia   Unspecified glaucoma   PERIPHERAL VASCULAR DISEASE   DM type 2 (diabetes mellitus, type 2) (HCC)   HTN (hypertension), benign   Bradycardia   History of atherosclerotic cardiovascular disease   Pain of right lower extremity   Type 2 diabetes mellitus with peripheral neuropathy  (HCC)   Benign essential HTN   Coronary artery disease involving coronary bypass graft of native heart without angina pectoris   Chronic combined systolic and diastolic congestive heart failure (HCC)   Leukocytosis   Transaminitis   Unilateral AKA, right ([place])   Cardiac arrest ([place])   Ischemia to the right foot Patient is s/p iliac stent and AKA on 12/28. Doing well.  Plan for SNF. -vascular surgery recommendations for follow-up  Diabetic foot ulcer Patient is s/p AKA. Antibiotics discontinued.  Diabetes mellitus, type 2 Uncontrolled with hyper- and hypoglycemia. Last hemoglobin A1C of 7.6. -Continue Lantus and SSI  Essential hypertension Slightly uncontrolled -Continue, clonidine, amlodipine and metoprolol  Chronic diastolic heart failure Last EF of 55% with grade 2 diastolic dysfunction. Stable. -Continue Lasix and metoprolol  Hyperlipidemia Statin held secondary to mild elevation of AST/ALT  Elevated transaminases Unknown etiology. No symptoms. Improving. -Hepatitis panel -RUQ Ultrasound  Hyperbilirubinemia -Hepatitis panel -RUQ Ultrasound  Coronary artery disease S/p CABG   DVT prophylaxis: Heparin subq Code Status: Full code Family Communication: None at bedside Disposition Plan: SNF when bed available   Consultants:   Vascular surgery  Procedures:   Right AKA  Iliac stent  Antimicrobials:  None    Subjective: Some hip pain and mild stump pain.  Objective: Vitals:   12/15/17 0943 12/15/17 1455 12/15/17 2029 12/16/17 0357  BP: (!) 162/63 (!) 146/61 (!) 161/56 (!) 157/59  Pulse: 66 70 74 63  Resp:  17 18 18   Temp:  98.9 F (37.2 C) 99.3 F (37.4 C) 99.2 F (37.3  C)  TempSrc:  Oral Oral Oral  SpO2: 100% 99% 94% 98%  Weight:      Height:        Intake/Output Summary (Last 24 hours) at 12/16/2017 1229 Last data filed at 12/16/2017 1226 Gross per 24 hour  Intake 580 ml  Output 150 ml  Net 430 ml   Filed Weights   12/05/17 1803  12/06/17 0033 12/12/17 1923  Weight: 83.9 kg (185 lb) 82.2 kg (181 lb 3.5 oz) 76.2 kg (167 lb 15.9 oz)    Examination:  General exam: Appears calm and comfortable Respiratory system: Clear to auscultation. Respiratory effort normal. Cardiovascular system: S1 & S2 heard, RRR. No murmurs, rubs, gallops or clicks. Gastrointestinal system: Abdomen is nondistended, soft and nontender. No organomegaly or masses felt. Normal bowel sounds heard. Central nervous system: Alert and oriented. No focal neurological deficits. Extremities: No edema. No calf tenderness, right AKA with some tenderness around the suture sight. Skin: No cyanosis. No rashes Psychiatry: Judgement and insight appear normal. Mood & affect appropriate.     Data Reviewed: I have personally reviewed following labs and imaging studies  CBC: Recent Labs  Lab 12/12/17 1914 12/13/17 0328 12/14/17 0350 12/15/17 0347 12/16/17 0343  WBC 15.5* 11.2* 16.5* 11.7* 10.5  NEUTROABS 12.1* 10.3* 13.9* 9.0* 7.5  HGB 8.7* 8.4* 8.7* 8.7* 8.9*  HCT 28.0* 26.7* 27.1* 27.8* 27.6*  MCV 94.9 95.7 94.8 96.2 95.5  PLT 277 229 277 271 785   Basic Metabolic Panel: Recent Labs  Lab 12/12/17 1914 12/13/17 0328 12/14/17 0350 12/15/17 0347 12/16/17 0343  NA 135 134* 133* 136 138  K 4.0 4.6 4.5 4.1 4.0  CL 102 100* 99* 101 102  CO2 22 19* 25 25 28   GLUCOSE 185* 297* 308* 188* 100*  BUN 22* 26* 30* 19 15  CREATININE 1.34* 1.20 0.95 0.78 0.82  CALCIUM 8.3* 7.9* 8.1* 8.1* 8.2*   GFR: Estimated Creatinine Clearance: 69.2 mL/min (by C-G formula based on SCr of 0.82 mg/dL). Liver Function Tests: Recent Labs  Lab 12/12/17 1914 12/15/17 0347 12/16/17 0343  AST 312* 118* 100*  ALT 184* 101* 86*  ALKPHOS 213* 170* 160*  BILITOT 2.4* 1.4* 1.6*  PROT 6.3* 5.9* 6.2*  ALBUMIN 2.7* 2.6* 2.5*   No results for input(s): LIPASE, AMYLASE in the last 168 hours. No results for input(s): AMMONIA in the last 168 hours. Coagulation  Profile: Recent Labs  Lab 12/12/17 0745  INR 1.16   Cardiac Enzymes: No results for input(s): CKTOTAL, CKMB, CKMBINDEX, TROPONINI in the last 168 hours. BNP (last 3 results) No results for input(s): PROBNP in the last 8760 hours. HbA1C: No results for input(s): HGBA1C in the last 72 hours. CBG: Recent Labs  Lab 12/15/17 1156 12/15/17 1637 12/15/17 2105 12/16/17 0734 12/16/17 1225  GLUCAP 266* 163* 102* 97 203*   Lipid Profile: No results for input(s): CHOL, HDL, LDLCALC, TRIG, CHOLHDL, LDLDIRECT in the last 72 hours. Thyroid Function Tests: No results for input(s): TSH, T4TOTAL, FREET4, T3FREE, THYROIDAB in the last 72 hours. Anemia Panel: No results for input(s): VITAMINB12, FOLATE, FERRITIN, TIBC, IRON, RETICCTPCT in the last 72 hours. Sepsis Labs: No results for input(s): PROCALCITON, LATICACIDVEN in the last 168 hours.  Recent Results (from the past 240 hour(s))  MRSA PCR Screening     Status: None   Collection Time: 12/07/17  4:33 PM  Result Value Ref Range Status   MRSA by PCR NEGATIVE NEGATIVE Final    Comment:  The GeneXpert MRSA Assay (FDA approved for NASAL specimens only), is one component of a comprehensive MRSA colonization surveillance program. It is not intended to diagnose MRSA infection nor to guide or monitor treatment for MRSA infections.          Radiology Studies: No results found.      Scheduled Meds: . amLODipine  10 mg Oral Daily  . aspirin EC  81 mg Oral Daily  . feeding supplement (GLUCERNA SHAKE)  237 mL Oral BID BM  . feeding supplement (PRO-STAT SUGAR FREE 64)  30 mL Oral BID  . furosemide  40 mg Oral Daily  . gabapentin  100 mg Oral TID  . heparin  5,000 Units Subcutaneous Q8H  . insulin aspart  0-5 Units Subcutaneous QHS  . insulin aspart  0-9 Units Subcutaneous TID WC  . insulin glargine  25 Units Subcutaneous q morning - 10a  . lisinopril  20 mg Oral Daily  . metoprolol tartrate  100 mg Oral BID  .  polyethylene glycol  17 g Oral Daily  . sodium chloride flush  3 mL Intravenous Q12H  . timolol  1 drop Left Eye q morning - 10a   Continuous Infusions: . sodium chloride    . sodium chloride Stopped (12/13/17 0436)     LOS: 11 days     Cordelia Poche, MD [facility] 12/16/2017, 12:29 PM Pager: [phone]  If 7PM-7AM, please contact night-coverage [email or web address] Password [facility] 12/16/2017, 12:29 PM

## 2017-12-16 NOTE — Clinical Social Work Note (Addendum)
Clinical Social Work Assessment  Patient Details  Name: Marcus Hendrix MRN: [id number] Date of Birth: Jun 16, 1941  Date of referral:  12/16/17               Reason for consult:  Facility Placement                Permission sought to share information with:  Family Supports, Customer service manager Permission granted to share information::  Yes, Verbal Permission Granted  Name::     Marcus Hendrix  Agency::     Relationship::  wife  Contact Information:    [phone]  Housing/Transportation Living arrangements for the past 2 months:  Single Family Home Source of Information:  Patient Patient Interpreter Needed:  None Criminal Activity/Legal Involvement Pertinent to Current Situation/Hospitalization:  No - Comment as needed Significant Relationships:  Adult Children, Other Family Members, Spouse Lives with:  Relatives, Spouse Do you feel safe going back to the place where you live?  Yes Need for family participation in patient care:  Yes (Comment)(mobility)  Care giving concerns:  Pt lives with wife and adult grandson in [place], just had an above knee amputation and will need support adjusting to this. SNF recommended for safety and continued therapies.    Social Worker assessment / plan:  CSW met with pt at bedside on 12/31 briefly to confirm desire for SNF over CIR. Pt voiced agreement. CSW met with pt on 1/1 for full assessment. Pt lives in [place] with his wife Marcus Hendrix, and adult grandson Marcus Hendrix. These two individuals provide much of the pts supports, he feels well cared for and voices understanding of SNF process and desires placement at [facility]. Pt would like wife contacted if CSW has any more information as he is going to have a procedure the afternoon of 1/1. CSW continuing to follow to support discharge when medically appropriate.   Employment status:  Retired Forensic scientist:  [employer]rewing [employer]) PT  Recommendations:  [place] / Referral to community resources:  [place]  Patient/Family's Response to care:  Pt stated understanding of CSW role and is amenable to SNF with preference for [facility].   Patient/Family's Understanding of and Emotional Response to Diagnosis, Current Treatment, and Prognosis:  Pt states understanding of diagnosis, current treatment, and prognosis. Pt is accepting and appropriate to the situation. Pt has supports to help him with adjustment to amputation, and looking forward to going home after SNF.   Emotional Assessment Appearance:  Appears stated age Attitude/Demeanor/Rapport:  (appropriate; pleasant) Affect (typically observed):  Accepting, Pleasant Orientation:  Oriented to Self, Oriented to  Time, Oriented to Place, Oriented to Situation Alcohol / Substance use:  Tobacco Use(Former Smoker; Uses Chew Tobacco occasionally) Psych involvement (Current and /or in the community):  No (Comment)  Discharge Needs  Concerns to be addressed:  Care Coordination, Discharge Planning Concerns Readmission within the last 30 days:  Yes Current discharge risk:  Physical Impairment Barriers to Discharge:  Continued Medical Work up, [employer], [place] 12/16/2017, 9:45 AM

## 2017-12-16 NOTE — Social Work (Signed)
CSW has faxed clinicals to [facility], awaiting authorization.   CSW has also contacted [facility]. Due to holiday, facility admissions staff not available. CSW will try tomorrow (11/2). CSW continuing to follow.  Alexander Mt, [place] Work [phone]

## 2017-12-17 LAB — COMPREHENSIVE METABOLIC PANEL
ALT: 66 U/L — AB (ref 17–63)
AST: 71 U/L — AB (ref 15–41)
Albumin: 2.5 g/dL — ABNORMAL LOW (ref 3.5–5.0)
Alkaline Phosphatase: 145 U/L — ABNORMAL HIGH (ref 38–126)
Anion gap: 7 (ref 5–15)
BILIRUBIN TOTAL: 1.3 mg/dL — AB (ref 0.3–1.2)
BUN: 15 mg/dL (ref 6–20)
CO2: 27 mmol/L (ref 22–32)
CREATININE: 0.93 mg/dL (ref 0.61–1.24)
Calcium: 7.9 mg/dL — ABNORMAL LOW (ref 8.9–10.3)
Chloride: 100 mmol/L — ABNORMAL LOW (ref 101–111)
GFR calc Af Amer: >60 mL/min (ref >60)
Glucose, Bld: 234 mg/dL — ABNORMAL HIGH (ref 65–99)
Potassium: 4.1 mmol/L (ref 3.5–5.1)
Sodium: 134 mmol/L — ABNORMAL LOW (ref 135–145)
TOTAL PROTEIN: 6.2 g/dL — AB (ref 6.5–8.1)

## 2017-12-17 LAB — HEPATITIS PANEL, ACUTE
HEP B S AG: NEGATIVE
Hep A IgM: NEGATIVE
Hep B C IgM: NEGATIVE

## 2017-12-17 LAB — GLUCOSE, CAPILLARY
GLUCOSE-CAPILLARY: 223 mg/dL — AB (ref 65–99)
Glucose-Capillary: 82 mg/dL (ref 65–99)
Glucose-Capillary: 93 mg/dL (ref 65–99)
Glucose-Capillary: 83 mg/dL (ref 65–99)

## 2017-12-17 MED ORDER — INSULIN GLARGINE 100 UNIT/ML ~~LOC~~ SOLN
22.0000 [IU] | Freq: Every morning | SUBCUTANEOUS | Status: DC
  Administered 2017-12-18: 22 [IU] via SUBCUTANEOUS
  Filled 2017-12-17 (×2): qty 0.22

## 2017-12-17 NOTE — Social Work (Addendum)
CSW spoke with [employer], they are process request for authorization.  [place] also looking at pt information for SNF placement.   Due to holiday CSW was informed that there may be a slower turn around.  CSW will continue to follow.   5:12pm- [facility] will finish authorization when pt has selected facility. CSW let pt know and he wanted CSW to call wife.  Pt wife Marcus Hendrix would like CSW to call [facility] again in the morning and then talk to pt about [facility] if [facility] does not have a bed.   CSW continuing to follow to support discharge to SNF.   Alexander Mt, [place] Work [phone]

## 2017-12-17 NOTE — Progress Notes (Signed)
   12/17/17 1440  OT Visit Information  Last OT Received On 12/17/17  Assistance Needed +2  History of Present Illness Pt is a 77 y/o male s/p R transfemoral amputation. PMH including but not limited to DM, HTN, HLD, PVD. Of note, following surgery after return to floor pt coded CPR was performed for two minutes and pt revived.  Precautions  Precautions Fall  Precaution Comments new Right AKA  Pain Assessment  Pain Assessment Faces  Faces Pain Scale 8  Pain Location buttocks, R LE  Pain Descriptors / Indicators Sore;Grimacing;Guarding  Pain Intervention(s) Monitored during session;Patient requesting pain meds-RN notified;Repositioned  Cognition  Arousal/Alertness Awake/alert  Behavior During Therapy WFL for tasks assessed/performed  Overall Cognitive Status No family/caregiver present to determine baseline cognitive functioning  Area of Impairment Problem solving  Problem Solving Slow processing;Requires verbal cues  ADL  Overall ADL's  Needs assistance/impaired  Grooming Sitting;Set up  Grooming Details (indicate cue type and reason) in recliner  Upper Body Dressing  Set up;Sitting  Functional mobility during ADLs Moderate assistance (squat pivot to bed)  General ADL Comments Pt fatigued after being up in chair. Requesting return to bed.   Bed Mobility  Overal bed mobility Needs Assistance  Bed Mobility Sit to Supine  Sit to supine Min assist  General bed mobility comments min assist for L LE into bed, increased time  Balance  Overall balance assessment Needs assistance  Sitting balance-Leahy Scale Fair  Transfers  Overall transfer level Needs assistance  Transfers Squat Pivot Transfers  Squat pivot transfers Mod assist  General transfer comment pivoted to L chair to bed with cues or technique and moderate assist  OT - End of Session  Equipment Utilized During Treatment Gait belt  Activity Tolerance Patient tolerated treatment well  Patient left in bed;with call  bell/phone within reach;with bed alarm set  Nurse Communication Patient requests pain meds  OT Assessment/Plan  OT Plan Discharge plan needs to be updated;Frequency needs to be updated  OT Visit Diagnosis Unsteadiness on feet (R26.81);Other abnormalities of gait and mobility (R26.89);Pain  Pain - Right/Left Right  Pain - part of body Leg  OT Frequency (ACUTE ONLY) Min 2X/week  Follow Up Recommendations SNF;Supervision/Assistance - 24 hour  OT Equipment Other (comment) (defer to next venue)  AM-PAC OT "6 Clicks" Daily Activity Outcome Measure  Help from another person eating meals? 4  Help from another person taking care of personal grooming? 3  Help from another person toileting, which includes using toliet, bedpan, or urinal? 2  Help from another person bathing (including washing, rinsing, drying)? 2  Help from another person to put on and taking off regular upper body clothing? 3  Help from another person to put on and taking off regular lower body clothing? 2  6 Click Score 16  ADL G Code Conversion CK  OT Goal Progression  Progress towards OT goals Progressing toward goals  Acute Rehab OT Goals  Patient Stated Goal to get stronger  OT Goal Formulation With patient  Time For Goal Achievement 12/28/17  Potential to Achieve Goals Good  OT Time Calculation  OT Start Time (ACUTE ONLY) 1410  OT Stop Time (ACUTE ONLY) 1435  OT Time Calculation (min) 25 min  OT General Charges  $OT Visit 1 Visit  OT Treatments  $Self Care/Home Management  23-37 mins  12/17/2017 Nestor Lewandowsky, OTR/L Pager: [phone]

## 2017-12-17 NOTE — Progress Notes (Signed)
Physical Therapy Treatment Patient Details Name: Marcus Hendrix MRN: [id number] DOB: [birth year] Today's Date: 12/17/2017    History of Present Illness Pt is a 77 y/o male s/p R transfemoral amputation. PMH including but not limited to DM, HTN, HLD, PVD. Of note, following surgery after return to floor pt coded CPR was performed for two minutes and pt revived.    PT Comments    Patient was able to take a few steps today and practice functional transfers. Pt required +2 for safe transfers and ambulation. Pt will continue to benefit from further skilled PT services to maximize independence and safety with mobility in acute and post acute settings.    Follow Up Recommendations  CIR;Supervision/Assistance - 24 hour     Equipment Recommendations  Rolling walker with 5" wheels;Wheelchair (measurements PT);Wheelchair cushion (measurements PT)    Recommendations for Other Services Rehab consult     Precautions / Restrictions Precautions Precautions: Fall Precaution Comments: new Right AKA Restrictions Weight Bearing Restrictions: Yes RLE Weight Bearing: Non weight bearing    Mobility  Bed Mobility               General bed mobility comments: pt OOB in chair upon arrival  Transfers Overall transfer level: Needs assistance Equipment used: Rolling walker (2 wheeled) Transfers: Sit to/from Stand Sit to Stand: +2 safety/equipment;Mod assist Stand pivot transfers: Mod assist;+2 physical assistance;+2 safety/equipment       General transfer comment: assist to power up into standing and to maintain balance upon standing; cues for hand placement and positioning L LE in preparation to stand  Ambulation/Gait Ambulation/Gait assistance: Mod assist;+2 safety/equipment Ambulation Distance (Feet): 4 Feet Assistive device: Rolling walker (2 wheeled) Gait Pattern/deviations: Step-to pattern;Trunk flexed(Hop-to)     General Gait Details: cues for posture, sequencing, and safe use of  AD; assist for balance and managing RW   Stairs            Wheelchair Mobility    Modified Rankin (Stroke Patients Only)       Balance Overall balance assessment: Needs assistance Sitting-balance support: Single extremity supported Sitting balance-Leahy Scale: Fair     Standing balance support: Bilateral upper extremity supported Standing balance-Leahy Scale: Poor Standing balance comment: Noted tends to fatigue into knee, hip, and trunk flexion with incr time standing                            Cognition Arousal/Alertness: Awake/alert Behavior During Therapy: WFL for tasks assessed/performed Overall Cognitive Status: No family/caregiver present to determine baseline cognitive functioning Area of Impairment: Problem solving                             Problem Solving: Slow processing;Requires verbal cues        Exercises      General Comments        Pertinent Vitals/Pain Pain Assessment: 0-10 Pain Score: 9  Faces Pain Scale: No hurt Pain Location: RLE Pain Descriptors / Indicators: Sore;Grimacing;Guarding;Burning Pain Intervention(s): Limited activity within patient's tolerance;Monitored during session;Repositioned;RN gave pain meds during session    Home Living                      Prior Function            PT Goals (current goals can now be found in the care plan section) Acute Rehab PT Goals Patient Stated Goal: to  get stronger PT Goal Formulation: With patient Time For Goal Achievement: 12/27/17 Potential to Achieve Goals: Good Progress towards PT goals: Progressing toward goals    Frequency    Min 4X/week      PT Plan Current plan remains appropriate;Frequency needs to be updated    Co-evaluation              AM-PAC PT "6 Clicks" Daily Activity  Outcome Measure  Difficulty turning over in bed (including adjusting bedclothes, sheets and blankets)?: A Little Difficulty moving from lying on  back to sitting on the side of the bed? : A Lot Difficulty sitting down on and standing up from a chair with arms (e.g., wheelchair, bedside commode, etc,.)?: Unable Help needed moving to and from a bed to chair (including a wheelchair)?: A Lot Help needed walking in hospital room?: A Lot Help needed climbing 3-5 steps with a railing? : Total 6 Click Score: 11    End of Session Equipment Utilized During Treatment: Gait belt Activity Tolerance: Patient tolerated treatment well Patient left: in chair;with call bell/phone within reach;with chair alarm set;with family/visitor present Nurse Communication: Mobility status PT Visit Diagnosis: Other abnormalities of gait and mobility (R26.89)     Time: 7416-3845 PT Time Calculation (min) (ACUTE ONLY): 23 min  Charges:  $Gait Training: 8-22 mins $Therapeutic Activity: 8-22 mins                    G Codes:       Earney Navy, PTA Pager: [phone]     Darliss Cheney 12/17/2017, 2:25 PM

## 2017-12-17 NOTE — Progress Notes (Signed)
PROGRESS NOTE    Marcus Hendrix  WGN:[id number] DOB: January 31, 1941 DOA: 12/05/2017 PCP: [name], MD   Brief Narrative: Marcus Hendrix is a 77 y.o. male with a history of diabetes mellitus, coronary artery disease, hyperlipidemia, hypertension, peripheral vascular disease, presenting with approximately 3-week history of right foo swelling, and drainage.  Patient does not recall any recent injury.  The patient was actually admitted to the hospital from November 28, 2017 through November 29, 2017 for chest pain during which time the patient was also treated for cellulitis of the right lower extremity.  He was discharged home with Augmentin and endorsed compliance with the medication.  He went to see his primary care provider on December 05, 2017 who noted increasing pain, edema and erythema.  As result, the patient was sent to the emergency department for further evaluation.  He states that right foot pain has been his major complaint for the past week.  He denies any fevers, chills, chest pain, shortness breath, coughing, hemoptysis, nausea, vomiting, diarrhea.  At the time of admission, WBC was 7.9, and the patient was afebrile hemodynamically stable.  Upon evaluation on the morning of December 06, 2017, the patient's right foot was noted to be cool to the touch.  In addition, the patient stated that for the past week he has had 2 dangle his right lower extremity to gravity to get relief of his pain.  He has also noted some claudication type symptoms.  Pt is s/p R AKA. Vascular surgeon following   Assessment & Plan:   Principal Problem:   Diabetic foot infection ([place]) Active Problems:   Dyslipidemia   Unspecified glaucoma   PERIPHERAL VASCULAR DISEASE   DM type 2 (diabetes mellitus, type 2) (HCC)   HTN (hypertension), benign   Bradycardia   History of atherosclerotic cardiovascular disease   Pain of right lower extremity   Type 2 diabetes mellitus with peripheral neuropathy  (HCC)   Benign essential HTN   Coronary artery disease involving coronary bypass graft of native heart without angina pectoris   Chronic combined systolic and diastolic congestive heart failure (HCC)   Leukocytosis   Transaminitis   Unilateral AKA, right ([place])   Cardiac arrest ([place])   Ischemia to the right foot Patient is s/p iliac stent and AKA on 12/28. Doing well.  Plan for SNF. -vascular surgery recommendations for follow-up  Diabetic foot ulcer Patient is s/p AKA. Antibiotics discontinued.  Diabetes mellitus, type 2 Uncontrolled with hyper- and hypoglycemia. Last hemoglobin A1C of 7.6. Blood sugar well controlled, however, with age and AKA, will be less stringent with control. Fasting blood sugar of 82. -Continue Lantus (decreased to 22 units) and SSI  Essential hypertension Slightly uncontrolled -Continue, clonidine, amlodipine and metoprolol  Chronic diastolic heart failure Last EF of 55% with grade 2 diastolic dysfunction. Stable. -Continue Lasix and metoprolol  Hyperlipidemia Statin held secondary to mild elevation of AST/ALT  Elevated transaminases Unknown etiology. No symptoms. Improving. Ultrasound not significant for etiology; there is biliary ductal dilation but more likely secondary to history of cholecystectomy. -Hepatitis panel pending  Hyperbilirubinemia -Hepatitis panel pending  Coronary artery disease S/p CABG -Continue aspirin  Coughing Patient eating when I noticed this. Concern for aspiration. I think it would be beneficial for speech therapy to evaluate -SLP eval   DVT prophylaxis: Heparin subq Code Status: Full code Family Communication: None at bedside Disposition Plan: SNF when bed available   Consultants:   Vascular surgery  Procedures:   Right AKA  Iliac stent  Antimicrobials:  None    Subjective: Coughing while eating breakfast.  Objective: Vitals:   12/15/17 2029 12/16/17 0357 12/16/17 2133 12/17/17 0615  BP:  (!) 161/56 (!) 157/59 135/61 (!) 142/65  Pulse: 74 63 66 69  Resp: 18 18 18 18   Temp: 99.3 F (37.4 C) 99.2 F (37.3 C) 98.5 F (36.9 C) 98.4 F (36.9 C)  TempSrc: Oral Oral Oral Oral  SpO2: 94% 98% 98% 99%  Weight:      Height:        Intake/Output Summary (Last 24 hours) at 12/17/2017 0843 Last data filed at 12/17/2017 0615 Gross per 24 hour  Intake 1080 ml  Output 850 ml  Net 230 ml   Filed Weights   12/05/17 1803 12/06/17 0033 12/12/17 1923  Weight: 83.9 kg (185 lb) 82.2 kg (181 lb 3.5 oz) 76.2 kg (167 lb 15.9 oz)    Examination:  General exam: Appears calm and comfortable Respiratory system: Clear to auscultation. Respiratory effort normal. Cardiovascular system: S1 & S2 heard, RRR. No murmurs, rubs, gallops or clicks. Gastrointestinal system: Abdomen is nondistended, soft and nontender. No organomegaly or masses felt. Normal bowel sounds heard. Central nervous system: Alert and oriented. No focal neurological deficits. Extremities: No edema. No calf tenderness, right AKA. Skin: No cyanosis. No rashes Psychiatry: Judgement and insight appear normal. Mood & affect appropriate.     Data Reviewed: I have personally reviewed following labs and imaging studies  CBC: Recent Labs  Lab 12/12/17 1914 12/13/17 0328 12/14/17 0350 12/15/17 0347 12/16/17 0343  WBC 15.5* 11.2* 16.5* 11.7* 10.5  NEUTROABS 12.1* 10.3* 13.9* 9.0* 7.5  HGB 8.7* 8.4* 8.7* 8.7* 8.9*  HCT 28.0* 26.7* 27.1* 27.8* 27.6*  MCV 94.9 95.7 94.8 96.2 95.5  PLT 277 229 277 271 924   Basic Metabolic Panel: Recent Labs  Lab 12/12/17 1914 12/13/17 0328 12/14/17 0350 12/15/17 0347 12/16/17 0343  NA 135 134* 133* 136 138  K 4.0 4.6 4.5 4.1 4.0  CL 102 100* 99* 101 102  CO2 22 19* 25 25 28   GLUCOSE 185* 297* 308* 188* 100*  BUN 22* 26* 30* 19 15  CREATININE 1.34* 1.20 0.95 0.78 0.82  CALCIUM 8.3* 7.9* 8.1* 8.1* 8.2*   GFR: Estimated Creatinine Clearance: 69.2 mL/min (by C-G formula based on SCr  of 0.82 mg/dL). Liver Function Tests: Recent Labs  Lab 12/12/17 1914 12/15/17 0347 12/16/17 0343  AST 312* 118* 100*  ALT 184* 101* 86*  ALKPHOS 213* 170* 160*  BILITOT 2.4* 1.4* 1.6*  PROT 6.3* 5.9* 6.2*  ALBUMIN 2.7* 2.6* 2.5*   No results for input(s): LIPASE, AMYLASE in the last 168 hours. No results for input(s): AMMONIA in the last 168 hours. Coagulation Profile: Recent Labs  Lab 12/12/17 0745  INR 1.16   Cardiac Enzymes: No results for input(s): CKTOTAL, CKMB, CKMBINDEX, TROPONINI in the last 168 hours. BNP (last 3 results) No results for input(s): PROBNP in the last 8760 hours. HbA1C: No results for input(s): HGBA1C in the last 72 hours. CBG: Recent Labs  Lab 12/16/17 1225 12/16/17 1647 12/16/17 2125 12/16/17 2153 12/17/17 0801  GLUCAP 203* 98 68 81 82   Lipid Profile: No results for input(s): CHOL, HDL, LDLCALC, TRIG, CHOLHDL, LDLDIRECT in the last 72 hours. Thyroid Function Tests: No results for input(s): TSH, T4TOTAL, FREET4, T3FREE, THYROIDAB in the last 72 hours. Anemia Panel: No results for input(s): VITAMINB12, FOLATE, FERRITIN, TIBC, IRON, RETICCTPCT in the last 72 hours. Sepsis Labs: No results  for input(s): PROCALCITON, LATICACIDVEN in the last 168 hours.  Recent Results (from the past 240 hour(s))  MRSA PCR Screening     Status: None   Collection Time: 12/07/17  4:33 PM  Result Value Ref Range Status   MRSA by PCR NEGATIVE NEGATIVE Final    Comment:        The GeneXpert MRSA Assay (FDA approved for NASAL specimens only), is one component of a comprehensive MRSA colonization surveillance program. It is not intended to diagnose MRSA infection nor to guide or monitor treatment for MRSA infections.          Radiology Studies: US Abdomen Limited Ruq  Result Date: 12/16/2017 CLINICAL DATA:  Hyperbilirubinemia. EXAM: ULTRASOUND ABDOMEN LIMITED RIGHT UPPER QUADRANT COMPARISON:  April 11, 2006 FINDINGS: Gallbladder: Surgically absent.  Common bile duct: Diameter: 19.3 mm, marked dilatation, tapers distally to 10.4 mm Liver: There is heterogeneous echotexture of the liver with no focal discrete lesion identified. Intrahepatic biliary ductal dilatation is identified. Portal vein is patent on color Doppler imaging with normal direction of blood flow towards the liver. Incidental finding of right kidney simple cysts. IMPRESSION: Status post prior cholecystectomy. Intra and extrahepatic biliary ductal dilatation is identified likely postsurgical. Heterogeneous echotexture of the liver without focal discrete lesion noted. Electronically Signed   By: Abelardo Diesel M.D.   On: 12/16/2017 16:27        Scheduled Meds: . amLODipine  10 mg Oral Daily  . aspirin EC  81 mg Oral Daily  . feeding supplement (GLUCERNA SHAKE)  237 mL Oral BID BM  . feeding supplement (PRO-STAT SUGAR FREE 64)  30 mL Oral BID  . furosemide  40 mg Oral Daily  . gabapentin  100 mg Oral TID  . heparin  5,000 Units Subcutaneous Q8H  . insulin aspart  0-5 Units Subcutaneous QHS  . insulin aspart  0-9 Units Subcutaneous TID WC  . [START ON 12/18/2017] insulin glargine  22 Units Subcutaneous q morning - 10a  . lisinopril  20 mg Oral Daily  . metoprolol tartrate  100 mg Oral BID  . polyethylene glycol  17 g Oral Daily  . sodium chloride flush  3 mL Intravenous Q12H  . timolol  1 drop Left Eye q morning - 10a   Continuous Infusions: . sodium chloride    . sodium chloride Stopped (12/13/17 0436)     LOS: 12 days     Cordelia Poche, MD [facility] 12/17/2017, 8:43 AM Pager: [phone]  If 7PM-7AM, please contact night-coverage [email or web address] Password [facility] 12/17/2017, 8:43 AM

## 2017-12-17 NOTE — Evaluation (Signed)
Clinical/Bedside Swallow Evaluation Patient Details  Name: Marcus Hendrix MRN: [id number] Date of Birth: Mar 04, 1941  Today's Date: 12/17/2017 Time: SLP Start Time (ACUTE ONLY): 4315 SLP Stop Time (ACUTE ONLY): 1051 SLP Time Calculation (min) (ACUTE ONLY): 12 min  Past Medical History:  Past Medical History:  Diagnosis Date  . Cerebrovascular disease   . Diabetes mellitus   . Diabetes mellitus ([place])   . ED (erectile dysfunction)   . Glaucoma, left eye   . History of atherosclerotic cardiovascular disease   . History of myocardial infarction   . Hyperlipidemia   . Hypertension   . Inguinal hernia    Right  . Normocytic anemia   . Peripheral neuropathy   . PVD (peripheral vascular disease) ([place])   . Tubulovillous adenoma of colon    Past Surgical History:  Past Surgical History:  Procedure Laterality Date  . AMPUTATION Right 12/12/2017   Procedure: AMPUTATION ABOVE KNEE RIGHT;  Surgeon: Serafina Mitchell, MD;  Location: [place];  Service: Vascular;  Laterality: Right;  . CHOLECYSTECTOMY    . COLONOSCOPY [name] 07/22/2014   Procedure: COLONOSCOPY;  Surgeon: [name], MD;  [facility];  Service: Endoscopy;  Laterality: [name];  1235-moved to [place] notified pt  . CORONARY ARTERY BYPASS GRAFT  2002   x6  . INGUINAL HERNIA REPAIR  2004   Left  . LOWER EXTREMITY ANGIOGRAPHY [name] 12/08/2017   Procedure: LOWER EXTREMITY ANGIOGRAPHY;  Surgeon: Elam Dutch, MD;  Location: [place] CV LAB;  Service: Cardiovascular;  Laterality: [name];  . PERIPHERAL VASCULAR INTERVENTION Bilateral 12/08/2017   Procedure: PERIPHERAL VASCULAR INTERVENTION;  Surgeon: Elam Dutch, MD;  Location: [place] CV LAB;  Service: Cardiovascular;  Laterality: Bilateral;  [place]   HPI:  Pt is a 77 y/o male s/p R transfemoral amputation. PMH including but not limited to DM, HTN, HLD, PVD. Of note, following surgery after return to floor pt coded CPR was performed for two minutes and pt  revived.   Assessment / Plan / Recommendation Clinical Impression  Pt has a functional appearing oropharyngeal swallow without overt signs of aspiration noted. He does have frequent eructation with thin liquids and endorses occasional symptoms of reflux PTA, possibly suggestive of a mild esophageal issue at baseline. Recommend to continue current diet with general aspiration and reflux precautions. SLP to sign off - please re-order if needed. SLP Visit Diagnosis: Dysphagia, unspecified (R13.10)    Aspiration Risk  Mild aspiration risk    Diet Recommendation Regular;Thin liquid   Liquid Administration via: Cup;Straw Medication Administration: Whole meds with liquid Supervision: Patient able to self feed;Intermittent supervision to cue for compensatory strategies Compensations: Slow rate;Small sips/bites;Follow solids with liquid Postural Changes: Seated upright at 90 degrees;Remain upright for at least 30 minutes after po intake    Other  Recommendations Oral Care Recommendations: Oral care BID   Follow up Recommendations None      Frequency and Duration            Prognosis        Swallow Study   General HPI: Pt is a 77 y/o male s/p R transfemoral amputation. PMH including but not limited to DM, HTN, HLD, PVD. Of note, following surgery after return to floor pt coded CPR was performed for two minutes and pt revived. Type of Study: Bedside Swallow Evaluation Previous Swallow Assessment: none in chart Diet Prior to this Study: Regular;Thin liquids Temperature Spikes Noted: No Respiratory Status: Room air History of Recent  Intubation: No Behavior/Cognition: Alert;Cooperative;Pleasant mood Oral Cavity Assessment: Within Functional Limits Oral Care Completed by SLP: No Oral Cavity - Dentition: Dentures, top;Dentures, bottom Vision: Functional for self-feeding Self-Feeding Abilities: Able to feed self Patient Positioning: Upright in bed Baseline Vocal Quality: Hoarse(pt says  this has been going on for several weeks) Volitional Cough: Strong Volitional Swallow: Able to elicit    Oral/Motor/Sensory Function Overall Oral Motor/Sensory Function: Within functional limits   Ice Chips Ice chips: Not tested   Thin Liquid Thin Liquid: Within functional limits Presentation: Cup;Self Fed;Straw    Nectar Thick Nectar Thick Liquid: Not tested   Honey Thick Honey Thick Liquid: Not tested   Puree Puree: Within functional limits Presentation: Self Fed;Spoon   Solid   GO   Solid: Within functional limits Presentation: Self [place] 12/17/2017,11:26 AM  Germain Osgood, M.A. CCC-SLP [phone]

## 2017-12-18 ENCOUNTER — Other Ambulatory Visit: Payer: Self-pay

## 2017-12-18 LAB — BASIC METABOLIC PANEL
Anion gap: 10 (ref 5–15)
BUN: 15 mg/dL (ref 6–20)
CALCIUM: 8.2 mg/dL — AB (ref 8.9–10.3)
CO2: 24 mmol/L (ref 22–32)
Chloride: 103 mmol/L (ref 101–111)
Creatinine, Ser: 0.92 mg/dL (ref 0.61–1.24)
GLUCOSE: 183 mg/dL — AB (ref 65–99)
POTASSIUM: 4.2 mmol/L (ref 3.5–5.1)
Sodium: 137 mmol/L (ref 135–145)

## 2017-12-18 LAB — GLUCOSE, CAPILLARY
GLUCOSE-CAPILLARY: 85 mg/dL (ref 65–99)
GLUCOSE-CAPILLARY: 101 mg/dL — AB (ref 65–99)
GLUCOSE-CAPILLARY: 184 mg/dL — AB (ref 65–99)
Glucose-Capillary: 101 mg/dL — ABNORMAL HIGH (ref 65–99)
Glucose-Capillary: 98 mg/dL (ref 65–99)

## 2017-12-18 LAB — MAGNESIUM: Magnesium: 2 mg/dL (ref 1.7–2.4)

## 2017-12-18 MED ORDER — CARVEDILOL 12.5 MG PO TABS
12.5000 mg | ORAL_TABLET | Freq: Two times a day (BID) | ORAL | Status: DC
  Administered 2017-12-18 – 2017-12-19 (×2): 12.5 mg via ORAL
  Filled 2017-12-18 (×2): qty 1

## 2017-12-18 MED ORDER — ADULT MULTIVITAMIN W/MINERALS CH
1.0000 | ORAL_TABLET | Freq: Every day | ORAL | Status: DC
  Administered 2017-12-18 – 2017-12-19 (×2): 1 via ORAL
  Filled 2017-12-18 (×2): qty 1

## 2017-12-18 MED ORDER — GABAPENTIN 100 MG PO CAPS
200.0000 mg | ORAL_CAPSULE | Freq: Once | ORAL | Status: AC
  Administered 2017-12-18: 200 mg via ORAL
  Filled 2017-12-18: qty 2

## 2017-12-18 MED ORDER — GABAPENTIN 300 MG PO CAPS
300.0000 mg | ORAL_CAPSULE | Freq: Three times a day (TID) | ORAL | Status: DC
  Administered 2017-12-18 – 2017-12-19 (×3): 300 mg via ORAL
  Filled 2017-12-18 (×3): qty 1

## 2017-12-18 MED ORDER — MAGNESIUM SULFATE 2 GM/50ML IV SOLN
2.0000 g | Freq: Once | INTRAVENOUS | Status: AC
  Administered 2017-12-18: 2 g via INTRAVENOUS
  Filled 2017-12-18: qty 50

## 2017-12-18 NOTE — Social Work (Signed)
CSW met with pt at bedside, CSW explained that [facility] could not offer placement. CSW explained that [facility] and [facility] had also made offers.   Pt states that he does not want to go to [place], expresses concerns that his wife would not be able to get to him in [place]. Pt states "I just want to go home, I can't go to [place], I have a grandson at home that is able to help me." CSW talked to pt more about safety recommendations from PT/OT in regards to SNF. Pt still states he would like to go home.   CSW has alerted MD and RN Case Manager.   CSW signing off. Please consult if any additional needs arise.  Alexander Mt, [place] Work [phone]

## 2017-12-18 NOTE — Progress Notes (Signed)
PROGRESS NOTE    Marcus Hendrix  ZYS:[id number] DOB: May 20, 1941 DOA: 12/05/2017 PCP: [name], MD   Brief Narrative: Marcus Hendrix is a 77 y.o. male with a history of diabetes mellitus, coronary artery disease, hyperlipidemia, hypertension, peripheral vascular disease, presenting with approximately 3-week history of right foo swelling, and drainage.  Patient does not recall any recent injury.  The patient was actually admitted to the hospital from November 28, 2017 through November 29, 2017 for chest pain during which time the patient was also treated for cellulitis of the right lower extremity.  He was discharged home with Augmentin and endorsed compliance with the medication.  He went to see his primary care provider on December 05, 2017 who noted increasing pain, edema and erythema.  As result, the patient was sent to the emergency department for further evaluation.  He states that right foot pain has been his major complaint for the past week.  He denies any fevers, chills, chest pain, shortness breath, coughing, hemoptysis, nausea, vomiting, diarrhea.  At the time of admission, WBC was 7.9, and the patient was afebrile hemodynamically stable.  Upon evaluation on the morning of December 06, 2017, the patient's right foot was noted to be cool to the touch.  In addition, the patient stated that for the past week he has had 2 dangle his right lower extremity to gravity to get relief of his pain.  He has also noted some claudication type symptoms.  Pt is s/p R AKA. Vascular surgeon following   Assessment & Plan:   Principal Problem:   Diabetic foot infection ([place]) Active Problems:   Dyslipidemia   Unspecified glaucoma   PERIPHERAL VASCULAR DISEASE   DM type 2 (diabetes mellitus, type 2) (HCC)   HTN (hypertension), benign   Bradycardia   History of atherosclerotic cardiovascular disease   Pain of right lower extremity   Type 2 diabetes mellitus with peripheral neuropathy  (HCC)   Benign essential HTN   Coronary artery disease involving coronary bypass graft of native heart without angina pectoris   Chronic combined systolic and diastolic congestive heart failure (HCC)   Leukocytosis   Transaminitis   Unilateral AKA, right ([place])   Cardiac arrest ([place])   Ischemia to the right foot Patient is s/p iliac stent and AKA on 12/28. Doing well.  Plan for SNF. -vascular surgery recommendations for follow-up Appears to have phantom pain, will increase gabapentin to 300 mg 3 times daily and monitor.  Diabetic foot ulcer Patient is s/p AKA. Antibiotics discontinued.  Diabetes mellitus, type 2 Uncontrolled with hyper- and hypoglycemia. Last hemoglobin A1C of 7.6. Blood sugar well controlled, however, with age and AKA, will be less stringent with control. Fasting blood sugar with hypoglycemia. Hold Lantus.  Essential hypertension Slightly uncontrolled -Continue, clonidine, amlodipine and metoprolol  Chronic diastolic heart failure Last EF of 55% with grade 2 diastolic dysfunction. Stable. -Continue Lasix and metoprolol  Hyperlipidemia Statin held secondary to mild elevation of AST/ALT  Elevated transaminases Unknown etiology. No symptoms. Improving. Ultrasound not significant for etiology; there is biliary ductal dilation but more likely secondary to history of cholecystectomy. -Hepatitis panel pending  Hyperbilirubinemia -Hepatitis panel pending  Coronary artery disease S/p CABG -Continue aspirin  Coughing Patient eating when I noticed this. Concern for aspiration. I think it would be beneficial for speech therapy to evaluate -SLP eval   DVT prophylaxis: Heparin subq Code Status: Full code Family Communication: None at bedside Disposition Plan: SNF when bed available   Consultants:  Vascular surgery  Procedures:   Right AKA  Iliac stent  Antimicrobials:  None    Subjective: Complaint severe pain in his leg.  No nausea no  vomiting.  Objective: Vitals:   12/17/17 2057 12/18/17 0501 12/18/17 0745 12/18/17 1428  BP: (!) 142/57 (!) 142/53 (!) 156/52 (!) 145/50  Pulse: 68 62 68 70  Resp:  18  18  Temp:  98.3 F (36.8 C)  98.8 F (37.1 C)  TempSrc:  Oral  Oral  SpO2:  98% 98% 97%  Weight:      Height:        Intake/Output Summary (Last 24 hours) at 12/18/2017 1738 Last data filed at 12/18/2017 1413 Gross per 24 hour  Intake 500 ml  Output 600 ml  Net -100 ml   Filed Weights   12/05/17 1803 12/06/17 0033 12/12/17 1923  Weight: 83.9 kg (185 lb) 82.2 kg (181 lb 3.5 oz) 76.2 kg (167 lb 15.9 oz)    Examination:  General exam: Appears calm and comfortable Respiratory system: Clear to auscultation. Respiratory effort normal. Cardiovascular system: S1 & S2 heard, RRR. No murmurs, rubs, gallops or clicks. Gastrointestinal system: Abdomen is nondistended, soft and nontender. No organomegaly or masses felt. Normal bowel sounds heard. Central nervous system: Alert and oriented. No focal neurological deficits. Extremities: No edema. No calf tenderness, right AKA. Skin: No cyanosis. No rashes Psychiatry: Judgement and insight appear normal. Mood & affect appropriate.     Data Reviewed: I have personally reviewed following labs and imaging studies  CBC: Recent Labs  Lab 12/12/17 1914 12/13/17 0328 12/14/17 0350 12/15/17 0347 12/16/17 0343  WBC 15.5* 11.2* 16.5* 11.7* 10.5  NEUTROABS 12.1* 10.3* 13.9* 9.0* 7.5  HGB 8.7* 8.4* 8.7* 8.7* 8.9*  HCT 28.0* 26.7* 27.1* 27.8* 27.6*  MCV 94.9 95.7 94.8 96.2 95.5  PLT 277 229 277 271 720   Basic Metabolic Panel: Recent Labs  Lab 12/14/17 0350 12/15/17 0347 12/16/17 0343 12/17/17 1055 12/18/17 1023  NA 133* 136 138 134* 137  K 4.5 4.1 4.0 4.1 4.2  CL 99* 101 102 100* 103  CO2 25 25 28 27 24   GLUCOSE 308* 188* 100* 234* 183*  BUN 30* 19 15 15 15   CREATININE 0.95 0.78 0.82 0.93 0.92  CALCIUM 8.1* 8.1* 8.2* 7.9* 8.2*  MG  --   --   --   --  2.0    GFR: Estimated Creatinine Clearance: 61.6 mL/min (by C-G formula based on SCr of 0.92 mg/dL). Liver Function Tests: Recent Labs  Lab 12/12/17 1914 12/15/17 0347 12/16/17 0343 12/17/17 1055  AST 312* 118* 100* 71*  ALT 184* 101* 86* 66*  ALKPHOS 213* 170* 160* 145*  BILITOT 2.4* 1.4* 1.6* 1.3*  PROT 6.3* 5.9* 6.2* 6.2*  ALBUMIN 2.7* 2.6* 2.5* 2.5*   No results for input(s): LIPASE, AMYLASE in the last 168 hours. No results for input(s): AMMONIA in the last 168 hours. Coagulation Profile: Recent Labs  Lab 12/12/17 0745  INR 1.16   Cardiac Enzymes: No results for input(s): CKTOTAL, CKMB, CKMBINDEX, TROPONINI in the last 168 hours. BNP (last 3 results) No results for input(s): PROBNP in the last 8760 hours. HbA1C: No results for input(s): HGBA1C in the last 72 hours. CBG: Recent Labs  Lab 12/17/17 2050 12/18/17 0311 12/18/17 0746 12/18/17 1158 12/18/17 1709  GLUCAP 83 85 101* 184* 101*   Lipid Profile: No results for input(s): CHOL, HDL, LDLCALC, TRIG, CHOLHDL, LDLDIRECT in the last 72 hours. Thyroid Function Tests: No  results for input(s): TSH, T4TOTAL, FREET4, T3FREE, THYROIDAB in the last 72 hours. Anemia Panel: No results for input(s): VITAMINB12, FOLATE, FERRITIN, TIBC, IRON, RETICCTPCT in the last 72 hours. Sepsis Labs: No results for input(s): PROCALCITON, LATICACIDVEN in the last 168 hours.  No results found for this or any previous visit (from the past 240 hour(s)).       Radiology Studies: No results found.      Scheduled Meds: . amLODipine  10 mg Oral Daily  . aspirin EC  81 mg Oral Daily  . carvedilol  12.5 mg Oral BID WC  . feeding supplement (PRO-STAT SUGAR FREE 64)  30 mL Oral BID  . furosemide  40 mg Oral Daily  . gabapentin  300 mg Oral TID  . heparin  5,000 Units Subcutaneous Q8H  . insulin aspart  0-5 Units Subcutaneous QHS  . insulin aspart  0-9 Units Subcutaneous TID WC  . insulin glargine  22 Units Subcutaneous q morning -  10a  . lisinopril  20 mg Oral Daily  . multivitamin with minerals  1 tablet Oral Daily  . polyethylene glycol  17 g Oral Daily  . timolol  1 drop Left Eye q morning - 10a   Continuous Infusions:    LOS: 13 days     Berle Mull, MD [facility] 12/18/2017, 5:38 PM  If 7PM-7AM, please contact night-coverage [email or web address] Password [facility] 12/18/2017, 5:38 PM

## 2017-12-18 NOTE — Progress Notes (Signed)
Physical Therapy Treatment Patient Details Name: CAPTAIN BLUCHER MRN: [id number] DOB: [birth year] Today's Date: 12/18/2017    History of Present Illness Pt is a 77 y/o male s/p R transfemoral amputation. PMH including but not limited to DM, HTN, HLD, PVD. Of note, following surgery after return to floor pt coded CPR was performed for two minutes and pt revived.    PT Comments    Patient continues to make progress toward mobility goals and is motivated to participate in therapy. Patient required less assist for functional transfers and able to ambulate 58ft. Current plan remains appropriate.    Follow Up Recommendations  CIR;Supervision/Assistance - 24 hour     Equipment Recommendations  Rolling walker with 5" wheels;Wheelchair (measurements PT);Wheelchair cushion (measurements PT)    Recommendations for Other Services Rehab consult     Precautions / Restrictions Precautions Precautions: Fall Precaution Comments: new Right AKA Restrictions Weight Bearing Restrictions: Yes RLE Weight Bearing: Non weight bearing    Mobility  Bed Mobility               General bed mobility comments: pt OOB in chair upon arrival  Transfers Overall transfer level: Needs assistance Equipment used: Rolling walker (2 wheeled) Transfers: Sit to/from [facility] Sit to Stand: Min assist;Mod assist;+2 safety/equipment Stand pivot transfers: Mod assist;+2 safety/equipment       General transfer comment: cues for hand placement and positioning of L foot in preparation to stand; mod A +2 for initial stand from recliner and next 2 trials min A from recliner and BSC; assist for balance and management of RW when pivoting; pt demonstrated improved standing balance and posture this session  Ambulation/Gait Ambulation/Gait assistance: +2 safety/equipment;Min assist Ambulation Distance (Feet): 8 Feet Assistive device: Rolling walker (2 wheeled) Gait Pattern/deviations: Step-to  pattern;Trunk flexed(Hop-to)     General Gait Details: cues for posture and proximity of RW   Stairs            Wheelchair Mobility    Modified Rankin (Stroke Patients Only)       Balance Overall balance assessment: Needs assistance Sitting-balance support: Feet supported Sitting balance-Leahy Scale: Fair Sitting balance - Comments: worked on chair push up while positioning self in chair to decrease scooting on bottom   Standing balance support: Bilateral upper extremity supported Standing balance-Leahy Scale: Poor                              Cognition Arousal/Alertness: Awake/alert Behavior During Therapy: WFL for tasks assessed/performed Overall Cognitive Status: No family/caregiver present to determine baseline cognitive functioning Area of Impairment: Problem solving;Orientation                 Orientation Level: Time           Problem Solving: Slow processing;Requires verbal cues General Comments: pt believes it is 9 pm not am and upset with family for not visiting today      Exercises      General Comments General comments (skin integrity, edema, etc.): pt educated on massaging R residual limb to aid in decreased phantom pains      Pertinent Vitals/Pain Pain Assessment: Faces Faces Pain Scale: Hurts little more Pain Location: R LE Pain Descriptors / Indicators: Burning;Grimacing;Guarding Pain Intervention(s): Limited activity within patient's tolerance;Monitored during session;Repositioned    Home Living                      Prior  Function            PT Goals (current goals can now be found in the care plan section) Acute Rehab PT Goals Patient Stated Goal: to get stronger PT Goal Formulation: With patient Time For Goal Achievement: 12/27/17 Potential to Achieve Goals: Good Progress towards PT goals: Progressing toward goals    Frequency    Min 4X/week      PT Plan Current plan remains  appropriate;Frequency needs to be updated    Co-evaluation              AM-PAC PT "6 Clicks" Daily Activity  Outcome Measure  Difficulty turning over in bed (including adjusting bedclothes, sheets and blankets)?: A Little Difficulty moving from lying on back to sitting on the side of the bed? : A Lot Difficulty sitting down on and standing up from a chair with arms (e.g., wheelchair, bedside commode, etc,.)?: Unable Help needed moving to and from a bed to chair (including a wheelchair)?: A Little Help needed walking in hospital room?: A Lot Help needed climbing 3-5 steps with a railing? : Total 6 Click Score: 12    End of Session Equipment Utilized During Treatment: Gait belt Activity Tolerance: Patient tolerated treatment well Patient left: in chair;with call bell/phone within reach;with chair alarm set Nurse Communication: Mobility status PT Visit Diagnosis: Other abnormalities of gait and mobility (R26.89)     Time: 1062-6948 PT Time Calculation (min) (ACUTE ONLY): 25 min  Charges:  $Gait Training: 8-22 mins $Therapeutic Activity: 8-22 mins                    G Codes:       Earney Navy, PTA Pager: [phone]     Darliss Cheney 12/18/2017, 10:52 AM

## 2017-12-18 NOTE — Progress Notes (Signed)
0740 I got a call from telemetry monitoring, pt had an episode of V-tach 34 beats, now on sinus rhythm w/ PAC's. Pt denies chest pain, no SOB. V/S checked. Message sent to Dr Posey Pronto.

## 2017-12-18 NOTE — Progress Notes (Signed)
Nutrition Follow-up  DOCUMENTATION CODES:   Not applicable  INTERVENTION:   -D/c Glucerna Shake po BID, each supplement provides 220 kcal and 10 grams of protein -30 ml Prostat BID, each supplement provides 100 kcals and 15 grams protein -MVI daily  NUTRITION DIAGNOSIS:   Increased nutrient needs related to (Poor appetite associated w/ pain in RLE Wound ) as evidenced by per patient/family report.  Ongoing  GOAL:   Patient will meet greater than or equal to 90% of their needs  Progressing  MONITOR:   PO intake, Supplement acceptance, Labs, Weight trends  REASON FOR ASSESSMENT:   Consult Wound healing  ASSESSMENT:   Marcus Hendrix is a 77 y.o. male with a history of diabetes mellitus, coronary artery disease, hyperlipidemia, hypertension, peripheral vascular disease, presenting with approximately 3-week history of right foo swelling, and drainage.  12/28- s/p rt AKA  Case discussed with RN. She reports good appetite, however, pt is overwhelmed with multiple supplements. RN confirms pt with good appetite (PO: 25-100%).   Spoke with pt, who reports fair appetite. He estimates he consumed about 50% of breakfast. He likes both the Glucerna and Prostat supplements. Discussed importance of good meal and supplement intake to promote healing.   Labs reviewed: CBGS: 184 (inpatient orders for glycemic control are 0-5 units insulin aspart q HS, 0-9 units insulin aspart TID with meals, and 22 units insulin glargine q AM).   Diet Order:  Diet Carb Modified Fluid consistency: Thin; Room service appropriate? Yes  EDUCATION NEEDS:   No education needs have been identified at this time  Skin:  Skin Assessment: Skin Integrity Issues: Skin Integrity Issues:: Incisions Incisions: closed rt leg  Last BM:  12/15/17  Height:   Ht Readings from Last 1 Encounters:  12/12/17 5\' 6"  (1.676 m)    Weight:   Wt Readings from Last 1 Encounters:  12/12/17 167 lb 15.9 oz (76.2 kg)     Ideal Body Weight:  59.4 kg  BMI:  Body mass index is 27.11 kg/m.  Estimated Nutritional Needs:   Kcal:  9242-6834  Protein:  85-100 grams  Fluid:  1.7-1.9 L    Maddix Kliewer A. Jimmye Norman, RD, LDN, CDE Pager: [phone] After hours Pager: [phone]

## 2017-12-19 LAB — GLUCOSE, CAPILLARY
GLUCOSE-CAPILLARY: 237 mg/dL — AB (ref 65–99)
Glucose-Capillary: 147 mg/dL — ABNORMAL HIGH (ref 65–99)
Glucose-Capillary: 57 mg/dL — ABNORMAL LOW (ref 65–99)

## 2017-12-19 MED ORDER — GERHARDT'S BUTT CREAM
TOPICAL_CREAM | Freq: Three times a day (TID) | CUTANEOUS | Status: DC
  Administered 2017-12-19: 13:00:00 via TOPICAL
  Filled 2017-12-19: qty 1

## 2017-12-19 MED ORDER — GERHARDT'S BUTT CREAM: 1.0000 "application " | TOPICAL_CREAM | Freq: Three times a day (TID) | CUTANEOUS | 0 refills | Status: AC

## 2017-12-19 MED ORDER — DEXTROSE 50 % IV SOLN
INTRAVENOUS | Status: AC
  Administered 2017-12-19: 25 mL
  Filled 2017-12-19: qty 50

## 2017-12-19 MED ORDER — CARVEDILOL 12.5 MG PO TABS: 12.5000 mg | ORAL_TABLET | Freq: Two times a day (BID) | ORAL | 0 refills | Status: AC

## 2017-12-19 MED ORDER — GABAPENTIN 100 MG PO CAPS: 200.0000 mg | ORAL_CAPSULE | Freq: Three times a day (TID) | ORAL | 0 refills | Status: AC

## 2017-12-19 MED ORDER — PRO-STAT SUGAR FREE PO LIQD: 30.0000 mL | Freq: Two times a day (BID) | ORAL | 0 refills | Status: AC

## 2017-12-19 MED ORDER — INSULIN GLARGINE 100 UNIT/ML ~~LOC~~ SOLN: 15.0000 [IU] | Freq: Every morning | SUBCUTANEOUS | 0 refills | Status: AC

## 2017-12-19 MED ORDER — FLUCONAZOLE IN SODIUM CHLORIDE 200-0.9 MG/100ML-% IV SOLN
200.0000 mg | Freq: Once | INTRAVENOUS | Status: AC
Start: 2017-12-19 — End: 2017-12-19
  Administered 2017-12-19: 200 mg via INTRAVENOUS
  Filled 2017-12-19: qty 100

## 2017-12-19 MED ORDER — OXYCODONE-ACETAMINOPHEN 5-325 MG PO TABS: 1.0000 | ORAL_TABLET | Freq: Four times a day (QID) | ORAL | 0 refills | Status: AC | PRN

## 2017-12-19 NOTE — Progress Notes (Signed)
Hypoglycemic Event  CBG: 57  Treatment: D50 IV 25 mL  Symptoms: Pale, lethargy  Follow-up CBG: Time:0810 CBG Result:147  Possible Reasons for Event: poor meal intake?  Comments/MD notified:  Dr Posey Pronto notified, Insulin orders adjusted.    Mei Suits Karin Golden

## 2017-12-19 NOTE — Discharge Instructions (Signed)
Leg Amputation, Care After This sheet gives you information about how to care for yourself after your procedure. Your health care provider may also give you more specific instructions. If you have problems or questions, contact your health care provider. What can I expect after the procedure? After the procedure, it is common to have:  A little blood or fluid coming from your incision.  Pain from your incision.  Pain that feels like it is coming from the leg that has been removed (phantom pain). This can last for a year or longer.  Skin breakdown on your stump (residual limb).  Feelings of depression, anxiety, and fear.  Follow these instructions at home: Medicines  Take over-the-counter and prescription medicines only as told by your health care provider.  If you were prescribed an antibiotic medicine, take it as told by your health care provider. Do not stop taking the antibiotic even if you start to feel better. Bathing  Do not take baths, swim, use a hot tub, or get your residual limb wet until your health care provider approves. You may only be allowed to take sponge baths.  Ask your health care provider when you may start taking showers. After taking a shower, make sure to rinse and dry your residual limb carefully. Incision care  Check your residual limb, especially your incision area, every day. Check for: ? More redness, swelling, or pain. ? More fluid or blood. ? Warmth. ? Pus or a bad smell. ? Blisters. ? Scrapes.  Follow instructions from your health care provider about how to take care of your incision. Make sure you: ? Wash your hands with soap and water before you change your bandage (dressing). If soap and water are not available, use hand sanitizer. ? Change your dressing as told by your health care provider. ? Leave stitches (sutures), skin glue, or adhesive strips in place. These skin closures may need to stay in place for 2 weeks or longer. If adhesive strip  edges start to loosen and curl up, you may trim the loose edges. Do not remove adhesive strips completely unless your health care provider tells you to do that. Activity  Return to your normal activities as told by your health care provider. Ask your health care provider what activities are safe for you.  Do physical therapy exercises as told by your health care provider.  If you have been fitted with an artificial leg (prosthesis) or have been given crutches, use them as told by your health care provider. Eating and drinking  Eat a healthy diet that includes whole grains, fruits and vegetables, low-fat dairy products, and lean proteins.  Drink enough fluid to keep your urine pale yellow. Driving  Work with an occupational therapist to learn new strategies for safe driving with an amputation.  Do not drive or use heavy equipment while taking prescription pain medicine. General instructions  To prevent or treat constipation while you are taking prescription pain medicine, your health care provider may recommend that you: ? Drink enough fluid to keep your urine pale yellow. ? Take over-the-counter or prescription medicines. ? Eat foods that are high in fiber, such as fresh fruits and vegetables, whole grains, and beans. ? Limit foods that are high in fat and processed sugars, such as fried and sweet foods.  Do not use oils, lotion, cream, or rubbing alcohol on the remaining part of your leg.  Wear compression stockings as told by your health care provider.  If you have trouble coping   with your amputation, contact your health care provider. Some feelings of depression, anxiety, or fear are normal after an amputation, but if you struggle with these feelings or if they get overwhelming, your provider may be able to recommend a therapist or support group to help you.  Do not use any products that contain nicotine or tobacco, such as cigarettes and e-cigarettes. These can delay bone healing.  If you need help quitting, ask your health care provider.  Keep all follow-up visits as told by your health care provider. This is important. Contact a health care provider if:  You have a fever.  You have more tenderness in your residual limb.  You have a rash or itchy skin.  You have a cough or chills and you feel achy and weak.  You have trouble coping with your amputation.  You have blisters or scrapes on your residual limb. Get help right away if:  You have severe pain in your residual limb.  You have more redness, swelling, or pain around your incision.  You have more fluid or blood coming from your incision.  Your incision feels warm to the touch, tender, and painful.  You have pus or a bad smell coming from your incision.  You feel light-headed and have shortness of breath.  You have blood-soaked bandages.  You cough up blood.  You have chest pain or pain when taking a deep breath or coughing. If you have these symptoms, do not drive yourself to the hospital. Call emergency services right away. If you ever feel like you may hurt yourself or others, or have thoughts about taking your own life, get help right away. You can go to your nearest emergency department or call:  Your local emergency services (911 in the [place]  A suicide crisis helpline, such as the National Suicide Prevention Lifeline at 1-800-273-8255. This is open 24 hours a day.  Summary  After a leg amputation, you may have pain that feels like it is coming from the leg that was removed (phantom pain). This can last for a year or longer.  Follow instructions from your health care provider about how to take care of your incision.  Check your residual limb, especially your incision area, every day. More redness, swelling, or pain may be a sign of infection.  Contact your health care provider if you have trouble coping with your amputation. This information is not intended to replace advice given to  you by your health care provider. Make sure you discuss any questions you have with your health care provider. Document Released: 03/12/2017 Document Revised: 03/12/2017 Document Reviewed: 03/12/2017 Elsevier Interactive Patient Education  2018 [employer]  

## 2017-12-19 NOTE — Care Management Note (Signed)
Case Management Note  Patient Details  Name: DERIC BOCOCK MRN: [id number] Date of Birth: 1940/12/23  Subjective/Objective:                    Action/Plan:  Discussed discharge planning with patient. Patient understands PT recommendation is SNF however he wants to go home. Grandson lives with him and wife and can assist. Patient has transportation home.   Referral given to Neoma Laming with [facility]  per patient request  Expected Discharge Date:  12/19/17               Expected Discharge Plan:  [place]  In-House Referral:     Discharge planning Services  CM Consult  Post Acute Care Choice:  Home Health, Durable Medical Equipment Choice offered to:  Patient  DME Arranged:  3-N-1, Walker rolling, Tub bench, Wheelchair manual DME Agency:  [place]:  RN, PT, OT, Nurse's Aide, Social Work [employer] Agency:  [employer], [place]  Status of Service:  Completed, signed off  If discussed at [employer], dates discussed:    Additional Comments:  Marilu Favre, RN 12/19/2017, 10:51 AM

## 2017-12-19 NOTE — Consult Note (Signed)
[facility] Nurse wound consult note Reason for Consult: moisture associated skin damage with supra infection (likely fungal overgrowth). Wound type: Moisture with infection Pressure Injury POA: [name] Measurement: right buttock with 7cm x 4cm x 0.1cm partial thickness skin loss with pink, dry base and no exudate.  Bilateral buttocks and perineal area with peeling epidermis consistent with fungal overgrowth. Wound bed:As described above Drainage (amount, consistency, odor) None Periwound:As described above Dressing procedure/placement/frequency: I have provided Nursing with guidance via the Orders for three times daily application of Gerhart's Butt Cream, a 1:1:1 compounded preparation of zinc oxide, hydrocortisone cream and lotrimin cream. Recommend one or two doses of systemic antifungal (eg., Diflucan) for more expeditious resolution of fungal overgrowth. If you agree, please order.  I have provided a mattress replacement with low air loss feature to assist with the drying of tissues and a pressure redistribution chair pad for comfort while OOB in chair. [place] nursing team will not follow, but will remain available to this patient, the nursing and medical teams.  Please re-consult if needed. Thanks, Maudie Flakes, MSN, RN, [place], Arther Abbott  Pager# [phone]

## 2017-12-19 NOTE — Progress Notes (Signed)
Pt's wife and nephew came in. Mobility tech Tamika showed to the nephew how to transfer the pt from bed to chair. Discharge instructions given to pt and wife. Discharged to home accompanied by wife.

## 2017-12-19 NOTE — Social Work (Signed)
CSW and RN Case Manager met with pt at bedside. Pt had friends from church visiting. Expressed to pt that RN had informed CSW that pt wife is unsure about pt going home.   Pt adamantly states "You're talking to the person that you need to talk to, I want to go home." CSW acknowledged this and asked pt to speak with wife about his choice when she comes.   CSW signing off. Please consult if any additional needs arise.  [place], [place] Work [phone]

## 2017-12-24 NOTE — Discharge Summary (Signed)
[facility] Discharge Summary   Patient: Marcus Hendrix YBO:[id number]   PCP: [name], MD DOB: [birth year]   Date of admission: 12/05/2017   Date of discharge: 12/19/2017     Discharge Diagnoses:  Principal Problem:   Diabetic foot infection ([place]) Active Problems:   Dyslipidemia   Unspecified glaucoma   PERIPHERAL VASCULAR DISEASE   DM type 2 (diabetes mellitus, type 2) ([place])   HTN (hypertension), benign   Bradycardia   History of atherosclerotic cardiovascular disease   Pain of right lower extremity   Type 2 diabetes mellitus with peripheral neuropathy ([place])   Benign essential HTN   Coronary artery disease involving coronary bypass graft of native heart without angina pectoris   Chronic combined systolic and diastolic congestive heart failure ([place])   Leukocytosis   Transaminitis   Unilateral AKA, right ([place])   Cardiac arrest ([place])   Admitted From: home Disposition:  Home with home health,(pt refused SNF)  Recommendations for Outpatient Follow-up:  1. Please follow up with PCP and vascular surgery as recommended    Follow-up Information    Serafina Mitchell, MD Follow up in 4 week(s).   Specialties:  Vascular Surgery, Cardiology Why:  Office will call you to arrange your appt (sent) Contact information: [place] [address] [phone]        [name], MD. Schedule an appointment as soon as possible for a visit in 1 week(s).   Specialty:  Family Medicine Contact information: [place] [address] [place] Follow up.   Contact information: [address] [place] [address] [phone]          Diet recommendation: cardiac diet  Activity: The patient is advised to gradually reintroduce usual activities.  Discharge Condition: good  Code Status: full code  History of present illness: As per the H and P dictated on admission, "Marcus Hendrix  is a 77 y.o. male with medical history significant of cerebrovascular disease, ED, type 2 diabetes, left eye glaucoma, CAD, history of MI, hyperlipidemia, hypertension, right inguinal hernia, normocytic anemia, diabetic peripheral neuropathy, peripheral vascular disease who is referred from his PCP to the emergency department for further evaluation of lower extremity wounds.  He denies any trauma to the area.  He denies fever, chills, fatigue or malaise.  However, he states his appetite and sleep are decreased lately due to the discomfort of the wounds.  Denies chest pain, dyspnea, palpitations, diaphoresis, abdominal pain, diarrhea, melena or hematochezia.  He gets frequent constipation.  Denies dysuria, frequency or hematuria.  He thinks that his diabetes is with good control and denies polyuria, polydipsia, polyphagia or blurred vision.  "  Hospital Course:  Summary of his active problems in the hospital is as following. Ischemia to the right foot Patient is s/p iliac stent and AKA on 12/28. Doing well.  Plan for SNF. -vascular surgery recommendations for follow-up Appears to have phantom pain, will increase gabapentin.  Diabetic foot ulcer Patient is s/p AKA. Antibiotics discontinued.  Diabetes mellitus, type 2 Uncontrolled with hyper- and hypoglycemia. Last hemoglobin A1C of 7.6.  Home regimen adjusted   Essential hypertension Slightly uncontrolled -Continue, clonidine, amlodipine and metoprolol  Chronic diastolic heart failure Last EF of 55% with grade 2 diastolic dysfunction. Stable. -Continue Lasix and metoprolol  Hyperlipidemia Statin held secondary to mild elevation of AST/ALT Resume on discharge  Elevated transaminases Unknown etiology. No symptoms. Improving.  Ultrasound not significant for etiology; there is biliary ductal dilation but more likely secondary to history of cholecystectomy.  Hyperbilirubinemia -Hepatitis panel negative  Coronary artery  disease S/p CABG -Continue aspirin  Coughing -SLP eval unremarkable  All other chronic medical condition were stable during the hospitalization.  Patient was seen by physical therapy, who recommended SNF,pt refused and home health which was arranged by Education officer, museum and case Freight forwarder. On the day of the discharge the patient's vitals were stable, and no other acute medical condition were reported by patient. the patient was felt safe to be discharge at home with home health.  Procedures and Results:  Right AKA  Angioplasty with stent   Consultations:  Vascular surgery  DISCHARGE MEDICATION: Allergies as of 12/19/2017      Reactions   Bidil [isosorb Dinitrate-hydralazine]    PASSED OUT BUT NOT SURE IF THIS WAS THE CAUSE      Medication List    STOP taking these medications   amoxicillin-clavulanate 875-125 MG tablet Commonly known as:  AUGMENTIN   metoprolol tartrate 100 MG tablet Commonly known as:  LOPRESSOR   sulfamethoxazole-trimethoprim 800-160 MG tablet Commonly known as:  BACTRIM DS,SEPTRA DS     TAKE these medications   amLODipine 10 MG tablet Commonly known as:  NORVASC Take 10 mg by mouth daily.   aspirin 81 MG tablet Take 81 mg by mouth daily.   atorvastatin 80 MG tablet Commonly known as:  LIPITOR TAKE ONE TABLET BY MOUTH ONCE DAILY.   carvedilol 12.5 MG tablet Commonly known as:  COREG Take 1 tablet (12.5 mg total) by mouth 2 (two) times daily with a meal.   feeding supplement (PRO-STAT SUGAR FREE 64) Liqd Take 30 mLs by mouth 2 (two) times daily.   furosemide 40 MG tablet Commonly known as:  LASIX Take 40 mg by mouth daily.   gabapentin 100 MG capsule Commonly known as:  NEURONTIN Take 2 capsules (200 mg total) by mouth 3 (three) times daily. What changed:    how much to take  when to take this   Gerhardt's butt cream Crea Apply 1 application topically 3 (three) times daily.   guaiFENesin-dextromethorphan 100-10 MG/5ML  syrup Commonly known as:  ROBITUSSIN DM Take 5 mLs by mouth every 4 (four) hours as needed for cough.   insulin glargine 100 UNIT/ML injection Commonly known as:  LANTUS Inject 0.15 mLs (15 Units total) into the skin every morning. What changed:  how much to take   lisinopril 20 MG tablet Commonly known as:  PRINIVIL,ZESTRIL TAKE ONE TABLET BY MOUTH TWICE DAILY. What changed:    how much to take  how to take this  when to take this   nitroGLYCERIN 0.4 MG SL tablet Commonly known as:  NITROSTAT Place 0.4 mg under the tongue every 5 (five) minutes as needed for chest pain.   oxyCODONE-acetaminophen 5-325 MG tablet Commonly known as:  PERCOCET/ROXICET Take 1 tablet by mouth every 6 (six) hours as needed for severe pain.   polyethylene glycol powder powder Commonly known as:  GLYCOLAX/MIRALAX Take 17 g by mouth daily.   potassium chloride SA 20 MEQ tablet Commonly known as:  K-DUR,KLOR-CON Take 20 mEq by mouth daily.   timolol 0.25 % ophthalmic solution Commonly known as:  TIMOPTIC Place 1 drop into the left eye every morning.   vitamin B-12 1000 MCG tablet Commonly known as:  CYANOCOBALAMIN Take 2,000 mcg by mouth daily.      Allergies  Allergen Reactions  .  Bidil [Isosorb Dinitrate-Hydralazine]     PASSED OUT BUT NOT SURE IF THIS WAS THE CAUSE   Discharge Instructions    Call MD for:  difficulty breathing, headache or visual disturbances   Complete by:  As directed    Call MD for:  extreme fatigue   Complete by:  As directed    Call MD for:  persistant nausea and vomiting   Complete by:  As directed    Call MD for:  redness, tenderness, or signs of infection (pain, swelling, redness, odor or green/yellow discharge around incision site)   Complete by:  As directed    Call MD for:  severe uncontrolled pain   Complete by:  As directed    Call MD for:  temperature >100.4   Complete by:  As directed    Diet - low sodium heart healthy   Complete by:  As  directed    Diet Carb Modified   Complete by:  As directed    Discharge instructions   Complete by:  As directed    It is important that you read following instructions as well as go over your medication list with RN to help you understand your care after this hospitalization.  Discharge Instructions: Please follow-up with PCP in one week  Please request your primary care physician to go over all Hospital Tests and Procedure/Radiological results at the follow up,  Please get all Hospital records sent to your PCP by signing hospital release before you go home.   Do not drive, operating heavy machinery, perform activities at heights, swimming or participation in water activities or provide baby sitting services while you are on Pain, Sleep and Anxiety Medications; until you have been seen by Primary Care Physician or a Neurologist and advised to do so again. Do not take more than prescribed Pain, Sleep and Anxiety Medications. You were cared for by a hospitalist during your hospital stay. If you have any questions about your discharge medications or the care you received while you were in the hospital after you are discharged, you can call the unit and ask to speak with the hospitalist on call if the hospitalist that took care of you is not available.  Once you are discharged, your primary care physician will handle any further medical issues. Please note that NO REFILLS for any discharge medications will be authorized once you are discharged, as it is imperative that you return to your primary care physician (or establish a relationship with a primary care physician if you do not have one) for your aftercare needs so that they can reassess your need for medications and monitor your lab values. You Must read complete instructions/literature along with all the possible adverse reactions/side effects for all the Medicines you take and that have been prescribed to you. Take any new Medicines after you  have completely understood and accept all the possible adverse reactions/side effects. Wear Seat belts while driving. If you have smoked or chewed Tobacco in the last 2 yrs please stop smoking and/or stop any Recreational drug use.   Increase activity slowly   Complete by:  As directed      Discharge Exam: Filed Weights   12/05/17 1803 12/06/17 0033 12/12/17 1923  Weight: 83.9 kg (185 lb) 82.2 kg (181 lb 3.5 oz) 76.2 kg (167 lb 15.9 oz)   Vitals:   12/18/17 2120 12/19/17 0533  BP: (!) 136/52 (!) 126/54  Pulse: 62 60  Resp: 18 18  Temp: 98.5 F (36.9  C) 98.3 F (36.8 C)  SpO2: 95% 94%   General: Appear in no distress, no Rash; Oral Mucosa moist Cardiovascular: S1 and S2 Present, no Murmur, no JVD Respiratory: Bilateral Air entry present and Clear to Auscultation, no Crackles, no wheezes Abdomen: Bowel Sound present, Soft and no tenderness Extremities: no Pedal edema, no calf tenderness Neurology: Grossly no focal neuro deficit.  The results of significant diagnostics from this hospitalization (including imaging, microbiology, ancillary and laboratory) are listed below for reference.    Significant Diagnostic Studies: Dg Chest 2 View  Result Date: 11/28/2017 CLINICAL DATA:  Dyspnea and chest pain with nonproductive cough and wheezing x1 week. EXAM: CHEST  2 VIEW COMPARISON:  05/22/2016 FINDINGS: Stable cardiomegaly with moderate aortic atherosclerosis. Status post median sternotomy. Diffuse interstitial prominence and peribronchial thickening consistent bronchitic change. No alveolar consolidation. Chronic posterior costophrenic angle blunting that may represent a small effusion or pleural thickening. No acute osseous abnormality. IMPRESSION: Stable cardiomegaly with aortic atherosclerosis. Peribronchial thickening with increased interstitial lung markings suspicious for bronchitic change. Electronically Signed   By: Ashley Royalty M.D.   On: 11/28/2017 20:14   Mr Foot Right Wo  Contrast  Result Date: 12/06/2017 CLINICAL DATA:  Right foot ulceration dorsal along the first MTP joint. Purulent collection. Swelling. EXAM: MRI OF THE RIGHT FOREFOOT WITHOUT CONTRAST TECHNIQUE: Multiplanar, multisequence MR imaging of the right forefoot was performed. No intravenous contrast was administered. COMPARISON:  Radiographs from 12/05/2017 FINDINGS: Despite efforts by the technologist and patient, motion artifact is present on today's exam and could not be eliminated. This reduces exam sensitivity and specificity. Bones/Joint/Cartilage There is an unusual pattern with edema signal tracking within all of the phalanges and in the heads of the metatarsals even on inversion recovery weighted images. There is some associated reduced T1 signal especially in the small digit phalanges. Ligaments Lisfranc ligament intact. Muscles and Tendons Diffuse edema tracks along the plantar musculature of the foot Soft tissues Extensive dorsal subcutaneous edema tracking along the foot and into the toes. IMPRESSION: 1. Unusual pattern with low-grade edema signal in all of the phalanges and the heads of the metatarsals. This is more striking in the small toe. There is also diffuse subcutaneous edema tracking into the toes. He would be unusual for osteomyelitis to involve all of the phalanges of the toes and metatarsal heads at once, and the appearance is more suggestive of a systemic or global phenomenon in the distal foot, possibly edema due to vascular insufficiency or reflex sympathetic dystrophy. There is currently no drainable abscess. Careful surveillance may be warranted. 2. Diffuse edema within along the plantar musculature of the foot, probably neurogenic although myositis is not readily excluded on today's noncontrast exam. I do not see any gas tracking in the soft tissues of the foot. Electronically Signed   By: Van Clines M.D.   On: 12/06/2017 18:44   Dg Foot Complete Right  Result Date:  12/05/2017 CLINICAL DATA:  Soft tissue wound right foot. EXAM: RIGHT FOOT COMPLETE - 3+ VIEW COMPARISON:  11/28/2017 FINDINGS: Mild degenerate change of the first MTP joint and midfoot. No evidence of bone destruction to suggest osteomyelitis. No air in the soft tissues. IMPRESSION: No acute findings. Electronically Signed   By: Marin Olp M.D.   On: 12/05/2017 19:19   Dg Foot Complete Right  Result Date: 11/28/2017 CLINICAL DATA:  Right foot pain and swelling. EXAM: RIGHT FOOT COMPLETE - 3+ VIEW COMPARISON:  None. FINDINGS: There is no evidence of fracture or dislocation.  No focal bone erosions. Mild hallux valgus deformity with degenerative changes at the first MTP joint. Soft tissues are unremarkable. IMPRESSION: No acute bone abnormalities. Electronically Signed   By: Kerby Moors M.D.   On: 11/28/2017 17:38   US Abdomen Limited Ruq  Result Date: 12/16/2017 CLINICAL DATA:  Hyperbilirubinemia. EXAM: ULTRASOUND ABDOMEN LIMITED RIGHT UPPER QUADRANT COMPARISON:  April 11, 2006 FINDINGS: Gallbladder: Surgically absent. Common bile duct: Diameter: 19.3 mm, marked dilatation, tapers distally to 10.4 mm Liver: There is heterogeneous echotexture of the liver with no focal discrete lesion identified. Intrahepatic biliary ductal dilatation is identified. Portal vein is patent on color Doppler imaging with normal direction of blood flow towards the liver. Incidental finding of right kidney simple cysts. IMPRESSION: Status post prior cholecystectomy. Intra and extrahepatic biliary ductal dilatation is identified likely postsurgical. Heterogeneous echotexture of the liver without focal discrete lesion noted. Electronically Signed   By: Abelardo Diesel M.D.   On: 12/16/2017 16:27    Microbiology: No results found for this or any previous visit (from the past 240 hour(s)).   Labs: CBC: No results for input(s): WBC, NEUTROABS, HGB, HCT, MCV, PLT in the last 168 hours. Basic Metabolic Panel: Recent Labs   Lab 12/17/17 1055 12/18/17 1023  NA 134* 137  K 4.1 4.2  CL 100* 103  CO2 27 24  GLUCOSE 234* 183*  BUN 15 15  CREATININE 0.93 0.92  CALCIUM 7.9* 8.2*  MG  --  2.0   Liver Function Tests: Recent Labs  Lab 12/17/17 1055  AST 71*  ALT 66*  ALKPHOS 145*  BILITOT 1.3*  PROT 6.2*  ALBUMIN 2.5*   No results for input(s): LIPASE, AMYLASE in the last 168 hours. No results for input(s): AMMONIA in the last 168 hours. Cardiac Enzymes: No results for input(s): CKTOTAL, CKMB, CKMBINDEX, TROPONINI in the last 168 hours. BNP (last 3 results) Recent Labs    11/28/17 1914  BNP 523.0*   CBG: Recent Labs  Lab 12/18/17 1709 12/18/17 2117 12/19/17 0749 12/19/17 0809 12/19/17 1145  GLUCAP 101* 98 57* 147* 237*   Time spent: 35 minutes  Signed:  Berle Mull  [facility] 12/19/2017 , 8:18 AM

## 2017-12-25 ENCOUNTER — Emergency Department (HOSPITAL_COMMUNITY)
Admission: EM | Admit: 2017-12-25 | Discharge: 2017-12-25 | Disposition: A | Discharge status: Home / Self Care | Payer: Medicare Other | Attending: Emergency Medicine | Admitting: Emergency Medicine

## 2017-12-25 ENCOUNTER — Encounter (HOSPITAL_COMMUNITY): Payer: Self-pay | Admitting: Emergency Medicine

## 2017-12-25 ENCOUNTER — Emergency Department (HOSPITAL_COMMUNITY): Payer: Medicare Other

## 2017-12-25 ENCOUNTER — Other Ambulatory Visit: Payer: Self-pay

## 2017-12-25 DIAGNOSIS — Z89611 Acquired absence of right leg above knee: Secondary | ICD-10-CM | POA: Insufficient documentation

## 2017-12-25 DIAGNOSIS — F17220 Nicotine dependence, chewing tobacco, uncomplicated: Secondary | ICD-10-CM | POA: Diagnosis not present

## 2017-12-25 DIAGNOSIS — I252 Old myocardial infarction: Secondary | ICD-10-CM | POA: Diagnosis not present

## 2017-12-25 DIAGNOSIS — I2510 Atherosclerotic heart disease of native coronary artery without angina pectoris: Secondary | ICD-10-CM | POA: Diagnosis not present

## 2017-12-25 DIAGNOSIS — E1140 Type 2 diabetes mellitus with diabetic neuropathy, unspecified: Secondary | ICD-10-CM | POA: Diagnosis not present

## 2017-12-25 DIAGNOSIS — R42 Dizziness and giddiness: Secondary | ICD-10-CM | POA: Diagnosis not present

## 2017-12-25 DIAGNOSIS — R55 Syncope and collapse: Secondary | ICD-10-CM | POA: Diagnosis not present

## 2017-12-25 DIAGNOSIS — R61 Generalized hyperhidrosis: Secondary | ICD-10-CM | POA: Diagnosis not present

## 2017-12-25 DIAGNOSIS — Z951 Presence of aortocoronary bypass graft: Secondary | ICD-10-CM | POA: Insufficient documentation

## 2017-12-25 DIAGNOSIS — I5042 Chronic combined systolic (congestive) and diastolic (congestive) heart failure: Secondary | ICD-10-CM | POA: Diagnosis not present

## 2017-12-25 DIAGNOSIS — I110 Hypertensive heart disease with heart failure: Secondary | ICD-10-CM | POA: Diagnosis not present

## 2017-12-25 LAB — COMPREHENSIVE METABOLIC PANEL
ALK PHOS: 214 U/L — AB (ref 38–126)
ALT: 139 U/L — AB (ref 17–63)
AST: 111 U/L — ABNORMAL HIGH (ref 15–41)
Albumin: 3.4 g/dL — ABNORMAL LOW (ref 3.5–5.0)
Anion gap: 12 (ref 5–15)
BILIRUBIN TOTAL: 0.8 mg/dL (ref 0.3–1.2)
BUN: 21 mg/dL — AB (ref 6–20)
CALCIUM: 9 mg/dL (ref 8.9–10.3)
CHLORIDE: 94 mmol/L — AB (ref 101–111)
CO2: 24 mmol/L (ref 22–32)
CREATININE: 0.99 mg/dL (ref 0.61–1.24)
Glucose, Bld: 303 mg/dL — ABNORMAL HIGH (ref 65–99)
Potassium: 4.5 mmol/L (ref 3.5–5.1)
Sodium: 130 mmol/L — ABNORMAL LOW (ref 135–145)
TOTAL PROTEIN: 7.6 g/dL (ref 6.5–8.1)

## 2017-12-25 LAB — CBC WITH DIFFERENTIAL/PLATELET
Basophils Absolute: 0 10*3/uL (ref 0.0–0.1)
Basophils Relative: 0 %
EOS ABS: 0.3 10*3/uL (ref 0.0–0.7)
EOS PCT: 3 %
HCT: 32.4 % — ABNORMAL LOW (ref 39.0–52.0)
HEMOGLOBIN: 10.2 g/dL — AB (ref 13.0–17.0)
Lymphocytes Relative: 15 %
Lymphs Abs: 1.4 10*3/uL (ref 0.7–4.0)
MCH: 30.7 pg (ref 26.0–34.0)
MCHC: 31.5 g/dL (ref 30.0–36.0)
MCV: 97.6 fL (ref 78.0–100.0)
Monocytes Absolute: 0.3 10*3/uL (ref 0.1–1.0)
Monocytes Relative: 3 %
NEUTROS PCT: 79 %
Neutro Abs: 7.2 10*3/uL (ref 1.7–7.7)
PLATELETS: 347 10*3/uL (ref 150–400)
RBC: 3.32 MIL/uL — AB (ref 4.22–5.81)
RDW: 14.7 % (ref 11.5–15.5)
WBC: 9.1 10*3/uL (ref 4.0–10.5)

## 2017-12-25 LAB — I-STAT TROPONIN, ED: TROPONIN I, POC: 0.02 ng/mL (ref 0.00–0.08)

## 2017-12-25 LAB — CBG MONITORING, ED: GLUCOSE-CAPILLARY: 303 mg/dL — AB (ref 65–99)

## 2017-12-25 MED ORDER — SODIUM CHLORIDE 0.9 % IV BOLUS (SEPSIS)
500.0000 mL | Freq: Once | INTRAVENOUS | Status: AC
  Administered 2017-12-25: 500 mL via INTRAVENOUS

## 2017-12-25 MED ORDER — IOPAMIDOL (ISOVUE-370) INJECTION 76%
75.0000 mL | Freq: Once | INTRAVENOUS | Status: AC | PRN
  Administered 2017-12-25: 75 mL via INTRAVENOUS

## 2017-12-25 NOTE — Discharge Instructions (Signed)
You were seen in the ED today after almost passing out at home. Your labs, x-rays, and CT scan looked normal. Be sure to eat and drink well at home and call your PCP this afternoon or first thing tomorrow to schedule the soonest available follow up appointment. Return to the ED with any chest pain, difficulty breathing, fever, abdominal pain, passing out or other concerning symptoms.

## 2017-12-25 NOTE — ED Triage Notes (Signed)
Per EMS: Called out to pt's home by home health nurse. Nurse states that pt had near syncopal episode with hot flashes and was diaphoretic. EMS states patient was not diaphoretic on arrival and was alert and oriented x 3. Patient states he had an AKA last week was getting a wound change today.

## 2017-12-25 NOTE — ED Provider Notes (Signed)
Emergency Department Provider Note   I have reviewed the triage vital signs and the nursing notes.   HISTORY  Chief Complaint Near Syncope   HPI Marcus Hendrix is a 77 y.o. 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  The patient was sitting in a chair shifting around when the symptoms began suddenly.  He denies any chest pain, difficulty breathing, heart palpitations.  Patient states he just felt bad and lightheaded but did not fully lose consciousness.  The patient's home health nurse arrived for a dressing change and wound evaluation.  At that time she found the patient to be diaphoretic and complaining of lightheadedness.  EMS was called and the symptoms have improved since that time.  He denies any abdominal pain, vomiting, diarrhea.  He states he is having bowel movements.  He has some pain in the right leg with movement but not suddenly worse.  No increased swelling or drainage from the wound.  He denies fevers or shaking chills.   Past Medical History:  Diagnosis Date  . Cerebrovascular disease   . Diabetes mellitus   . Diabetes mellitus ([place])   . ED (erectile dysfunction)   . Glaucoma, left eye   . History of atherosclerotic cardiovascular disease   . History of myocardial infarction   . Hyperlipidemia   . Hypertension   . Inguinal hernia    Right  . Normocytic anemia   . Peripheral neuropathy   . PVD (peripheral vascular disease) ([place])   . Tubulovillous adenoma of colon     Patient Active Problem List   Diagnosis Date Noted  . Pain of right lower extremity   . Type 2 diabetes mellitus with peripheral neuropathy (HCC)   . Benign essential HTN   . Coronary artery disease involving coronary bypass graft of native heart without angina pectoris   . Chronic combined systolic and diastolic congestive heart failure ([place])   . Leukocytosis   .  Transaminitis   . Unilateral AKA, right ([place])   . Cardiac arrest ([place])   . Bradycardia 12/06/2017  . History of atherosclerotic cardiovascular disease 12/06/2017  . Diabetic foot infection ([place]) 11/28/2017  . Acute CHF (congestive heart failure) ([place]) 11/28/2017  . Rectal bleeding 10/04/2013  . Heme positive stool 10/04/2013  . Community acquired pneumonia 07/30/2013  . DM type 2 (diabetes mellitus, type 2) ([place]) 07/30/2013  . HTN (hypertension), benign 07/30/2013  . CAROTID BRUITS, BILATERAL 07/16/2010  . MITRAL VALVE PROLAPSE 08/22/2009  . DM 07/22/2009  . Atherosclerotic cardiovascular disease 07/22/2009  . 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 ERECTILE DYSFUNCTION 05/27/2008  . INGUINAL HERNIA, RIGHT 10/19/2007  . ANEMIA, NORMOCYTIC 10/09/2007  . Dyslipidemia 12/19/2006  . PERIPHERAL NEUROPATHY 12/19/2006  . Unspecified glaucoma 12/19/2006  . Essential hypertension 12/19/2006  . MYOCARDIAL INFARCTION, HX OF 12/19/2006  . PERIPHERAL VASCULAR DISEASE 12/19/2006    Past Surgical History:  Procedure Laterality Date  . AMPUTATION Right 12/12/2017   Procedure: AMPUTATION ABOVE KNEE RIGHT;  Surgeon: Serafina Mitchell, MD;  Location: [facility];  Service: Vascular;  Laterality: Right;  . CHOLECYSTECTOMY    . COLONOSCOPY [name] 07/22/2014   Procedure: COLONOSCOPY;  Surgeon: [name], MD;  [facility];  Service: Endoscopy;  Laterality: [name];  1235-moved to [place] notified pt  . CORONARY ARTERY BYPASS GRAFT  2002   x6  . INGUINAL  HERNIA REPAIR  2004   Left  . LOWER EXTREMITY ANGIOGRAPHY [name] 12/08/2017   Procedure: LOWER EXTREMITY ANGIOGRAPHY;  Surgeon: Elam Dutch, MD;  Location: [facility];  Service: Cardiovascular;  Laterality: [name];  . PERIPHERAL VASCULAR INTERVENTION Bilateral 12/08/2017   Procedure: PERIPHERAL VASCULAR INTERVENTION;  Surgeon: Elam Dutch, MD;  Location: [facility];  Service: Cardiovascular;  Laterality: Bilateral;   [place]    Current Outpatient Rx  . Order #: [id number] Class: Historical Med  . Order #: [id number] Class: Normal  . Order #: [id number] Class: Normal  . Order #: [id number] Class: Historical Med  . Order #: [id number] Class: Normal  . Order #: [id number] Class: Print  . Order #: [id number] Class: Normal  . Order #: [id number] Class: Normal  . Order #: [id number] Class: Historical Med  . Order #: [id number] Class: Historical Med  . Order #: [id number] Class: Normal  . Order #: [id number] Class: Historical Med  . Order #: [id number] Class: Historical Med  . Order #: [id number] Class: Normal  . Order #: [id number] Class: Historical Med  . Order #: [id number] Class: Historical Med  . Order #: [id number] Class: Normal  . Order #: [id number] Class: OTC  . Order #: [id number] Class: Historical Med    Allergies Bidil [isosorb dinitrate-hydralazine]  Family History  Problem Relation Age of Onset  . Heart attack Mother   . Stroke Father   . Diabetes Sister   . HIV Brother   . Alcohol abuse Brother   . Diabetes Sister   . Heart attack Son     Social History Social History   Tobacco Use  . Smoking status: Former Smoker    Packs/day: 1.00    Years: 41.00    Pack years: 41.00    Types: Cigarettes    Start date: 02/06/1957    Last attempt to quit: 12/16/1997    Years since quitting: 20.0  . Smokeless tobacco: Current User    Types: Chew  . Tobacco comment: quit 12 yrs ago--10/13/15 currently a tobacco chewer for the past 25 years  Substance Use Topics  . Alcohol use: No    Alcohol/week: 0.0 oz  . Drug use: No    Review of Systems  Constitutional: No fever/chills Eyes: No visual changes. ENT: No sore throat. Cardiovascular: Denies chest pain. Positive near-syncope.  Respiratory: Denies shortness of breath. Gastrointestinal: No abdominal pain.  No nausea, no vomiting.  No diarrhea.  No constipation. Genitourinary: Negative for dysuria. Musculoskeletal: Negative for back pain. Skin: Negative for  rash. Neurological: Negative for headaches, focal weakness or numbness.  10-point ROS otherwise negative.  ____________________________________________   PHYSICAL EXAM:  VITAL SIGNS: ED Triage Vitals  Enc Vitals Group     BP 12/25/17 1147 (!) 136/56     Pulse Rate 12/25/17 1147 60     Resp 12/25/17 1147 16     Temp 12/25/17 1147 97.9 F (36.6 C)     Temp Source 12/25/17 1147 Oral     SpO2 12/25/17 1144 92 %     Weight 12/25/17 1148 167 lb (75.8 kg)     Height 12/25/17 1148 5\' 10"  (1.778 m)   Constitutional: Alert and oriented. Well appearing and in no acute distress. Eyes: Conjunctivae are normal.  Head: Atraumatic. Nose: No congestion/rhinnorhea. Mouth/Throat: Mucous membranes are slightly dry.  Neck: No stridor.  Cardiovascular: Normal rate, regular rhythm. Good peripheral circulation. Grossly normal heart sounds.   Respiratory: Normal respiratory effort.  No retractions. Lungs CTAB. Gastrointestinal: Soft and nontender. No distention.  Musculoskeletal: No lower extremity tenderness nor edema. Well-appearing incision  on the right AKA site. No surrounding cellulitis or abscess.  Neurologic:  Normal speech and language. No gross focal neurologic deficits are appreciated.  Skin:  Skin is warm, dry and intact. No rash noted.  ____________________________________________   LABS (all labs ordered are listed, but only abnormal results are displayed)  Labs Reviewed  COMPREHENSIVE METABOLIC PANEL - Abnormal; Notable for the following components:      Result Value   Sodium 130 (*)    Chloride 94 (*)    Glucose, Bld 303 (*)    BUN 21 (*)    Albumin 3.4 (*)    AST 111 (*)    ALT 139 (*)    Alkaline Phosphatase 214 (*)    All other components within normal limits  CBC WITH DIFFERENTIAL/PLATELET - Abnormal; Notable for the following components:   RBC 3.32 (*)    Hemoglobin 10.2 (*)    HCT 32.4 (*)    All other components within normal limits  CBG MONITORING, ED -  Abnormal; Notable for the following components:   Glucose-Capillary 303 (*)    All other components within normal limits  CULTURE, BLOOD (ROUTINE X 2)  CULTURE, BLOOD (ROUTINE X 2)  URINALYSIS, ROUTINE W REFLEX MICROSCOPIC  CBG MONITORING, ED  I-STAT TROPONIN, ED   ____________________________________________  EKG   EKG Interpretation  Date/Time:  Thursday December 25 2017 11:43:00 EST Ventricular Rate:  65 PR Interval:    QRS Duration: 109 QT Interval:  439 QTC Calculation: 457 R Axis:   81 Text Interpretation:  Sinus rhythm Prolonged PR interval Borderline right axis deviation Probable left ventricular hypertrophy Nonspecific T abnormalities, lateral leads No STEMI. Similar to prior.  Confirmed by Nanda Quinton [phone]) on 12/25/2017 12:28:58 PM       ____________________________________________  RADIOLOGY  Dg Chest 2 View  Result Date: 12/25/2017 CLINICAL DATA:  Near syncopal episode EXAM: CHEST  2 VIEW COMPARISON:  11/28/2017 FINDINGS: Cardiac shadow is enlarged but stable. Aortic calcifications are noted. Postsurgical changes are seen as well. The lungs are well aerated bilaterally. No focal infiltrate or sizable effusion is noted. Mild scarring in the left base is seen. Calcified lymph nodes are noted consistent with prior granulomatous disease. No other focal abnormality is seen. IMPRESSION: Chronic changes without acute abnormality. Electronically Signed   By: Inez Catalina M.D.   On: 12/25/2017 13:11   Ct Angio Chest Pe W And/or Wo Contrast  Result Date: 12/25/2017 CLINICAL DATA:  Near syncopal episode today. Above knee amputation last week. EXAM: CT ANGIOGRAPHY CHEST WITH CONTRAST TECHNIQUE: Multidetector CT imaging of the chest was performed using the standard protocol during bolus administration of intravenous contrast. Multiplanar CT image reconstructions and MIPs were obtained to evaluate the vascular anatomy. CONTRAST:  48mL ISOVUE-370 IOPAMIDOL (ISOVUE-370) INJECTION  76% COMPARISON:  Chest x-ray 12/25/2017. FINDINGS: Cardiovascular: Mild-to-moderate cardiomegaly. Calcified plaque over the left main and 3 vessel coronary arteries. Moderate calcified plaque over the thoracic aorta. Pulmonary arterial system is within normal without evidence of emboli. Mediastinum/Nodes: No evidence mediastinal or hilar adenopathy. Remaining mediastinal structures are unremarkable. Lungs/Pleura: Lungs are adequately inflated with mild emphysematous disease. Mild posterior bibasilar linear density compatible with atelectasis/scarring. No evidence of effusion. Airways are within normal. Upper Abdomen: Previous cholecystectomy. A 2.8 cm cyst over the mid pole right kidney with 1 cm cyst over the upper pole right kidney. Calcified plaque over the abdominal aorta. Stable 2.2 cm oval dense calcification over the left upper quadrant. Musculoskeletal: Degenerate change of the spine. Review  of the MIP images confirms the above findings. IMPRESSION: No evidence of pulmonary embolism. No acute cardiopulmonary disease. Mild bibasilar linear atelectasis/scarring. Aortic Atherosclerosis (ICD10-I70.0) and Emphysema (ICD10-J43.9). Mild to moderate cardiomegaly with left main and 3 vessel coronary artery disease. Two right renal cysts. Electronically Signed   By: Marin Olp M.D.   On: 12/25/2017 14:12    ____________________________________________   PROCEDURES  Procedure(s) performed:   Procedures  None ____________________________________________   INITIAL IMPRESSION / ASSESSMENT AND PLAN / ED COURSE  Pertinent labs & imaging results that were available during my care of the patient were reviewed by me and considered in my medical decision making (see chart for details).  Patient presents to the emergency department for evaluation of near syncope.  She has a significant surgical history of right AKA last week.  No swelling in the leg.  The incision is well-appearing.  Patient is afebrile.   He denies any chest pain, palpitations, shortness of breath prior to the event.  He was noted to have some mild hypoxemia on arrival and is currently on 2 L of oxygen.  My suspicion for PE is low.  Plan to obtain chest x-ray to evaluate for postop pneumonia.   01:35 PM Patient feeling slightly better after IVF. With mild hypoxemia on arrival plan for CTA to r/o PE given recent surgery but if negative believe that the patient can be discharged with PCP follow up.   02:48 PM CTA negative for PE or other acute process. Labs and vitals reassuring here. Plan for PCP and Cardiology follow up as outpatient. Patient has a wheelchair at home and family plans to call other family to help get him inside the house after discharge today. Discussed return precautions in detail.  At this time, I do not feel there is any life-threatening condition present. I have reviewed and discussed all results (EKG, imaging, lab, urine as appropriate), exam findings with patient. I have reviewed nursing notes and appropriate previous records.  I feel the patient is safe to be discharged home without further emergent workup. Discussed usual and customary return precautions. Patient and family (if present) verbalize understanding and are comfortable with this plan.  Patient will follow-up with their primary care provider. If they do not have a primary care provider, information for follow-up has been provided to them. All questions have been answered.  ____________________________________________  FINAL CLINICAL IMPRESSION(S) / ED DIAGNOSES  Final diagnoses:  Near syncope     MEDICATIONS GIVEN DURING THIS VISIT:  Medications  sodium chloride 0.9 % bolus 500 mL (0 mLs Intravenous Stopped 12/25/17 1335)  iopamidol (ISOVUE-370) 76 % injection 75 mL (75 mLs Intravenous Contrast Given 12/25/17 1353)     NEW OUTPATIENT MEDICATIONS STARTED DURING THIS VISIT:  None  Note:  This document was prepared using Dragon voice  recognition software and may include unintentional dictation errors.  Nanda Quinton, MD Emergency Medicine    Sally-Anne Wamble, [facility], MD 12/25/17 1452

## 2017-12-30 LAB — CULTURE, BLOOD (ROUTINE X 2)
CULTURE: NO GROWTH
CULTURE: NO GROWTH
SPECIAL REQUESTS: ADEQUATE

## 2017-12-31 ENCOUNTER — Telehealth: Payer: Self-pay

## 2017-12-31 NOTE — Telephone Encounter (Signed)
Returned call to Marcus Hendrix. He was requesting pain medication and something for his nerves due to the pain in his right thigh from his amputation in December. He was instructed to continue to work with home health with his physical therapy and contact his PCP to see about getting something for his nerves and pain meds or muscle relaxant for his right thigh. He has a follow-up appointment with Dr. Trula Slade on 01/12/18. Was told to please keep that appointment and call back as needed.

## 2018-01-12 ENCOUNTER — Ambulatory Visit (INDEPENDENT_AMBULATORY_CARE_PROVIDER_SITE_OTHER): Payer: Medicare Other | Admitting: Surgery

## 2018-01-12 ENCOUNTER — Encounter: Payer: Self-pay | Admitting: Surgery

## 2018-01-12 VITALS — BP 104/60 | HR 76 | Temp 97.3°F | Resp 16 | Ht 70.0 in | Wt 167.0 lb

## 2018-01-12 DIAGNOSIS — Z89611 Acquired absence of right leg above knee: Secondary | ICD-10-CM

## 2018-01-12 NOTE — Progress Notes (Signed)
POST OPERATIVE OFFICE NOTE    CC:  F/u for surgery  HPI:  This is a 77 y.o. male who is s/p right AKA 12/12/17 by Dr. Trula Slade.  He returns today for follow up.  He states he has done well at home and is getting around without a lot of assistance.  He still has PT coming out to work with him.  He states he has not had any fevers and minimal drainage from his stump.  He states his nerves have been a little rattled but he is doing ok.     Allergies  Allergen Reactions  . Bidil [Isosorb Dinitrate-Hydralazine]     PASSED OUT BUT NOT SURE IF THIS WAS THE CAUSE    Current Outpatient Medications  Medication Sig Dispense Refill  . Amino Acids-Protein Hydrolys (FEEDING SUPPLEMENT, PRO-STAT SUGAR FREE 64,) LIQD Take 30 mLs by mouth 2 (two) times daily. 900 mL 0  . amLODipine (NORVASC) 10 MG tablet Take 10 mg by mouth daily.      [name] aspirin 81 MG tablet Take 81 mg by mouth daily.    [name] atorvastatin (LIPITOR) 80 MG tablet TAKE ONE TABLET BY MOUTH ONCE DAILY. 15 tablet 0  . carvedilol (COREG) 12.5 MG tablet Take 1 tablet (12.5 mg total) by mouth 2 (two) times daily with a meal. 60 tablet 0  . cephALEXin (KEFLEX) 500 MG capsule Take 1 capsule by mouth 4 (four) times daily.  0  . furosemide (LASIX) 40 MG tablet Take 40 mg by mouth daily.      [name] gabapentin (NEURONTIN) 100 MG capsule Take 2 capsules (200 mg total) by mouth 3 (three) times daily. 180 capsule 0  . guaiFENesin-dextromethorphan (ROBITUSSIN DM) 100-10 MG/5ML syrup Take 5 mLs by mouth every 4 (four) hours as needed for cough. 118 mL 0  . Hydrocortisone (GERHARDT'S BUTT CREAM) CREA Apply 1 application topically 3 (three) times daily. 1 each 0  . insulin glargine (LANTUS) 100 UNIT/ML injection Inject 0.15 mLs (15 Units total) into the skin every morning. 10 mL 0  . lisinopril (PRINIVIL,ZESTRIL) 20 MG tablet TAKE ONE TABLET BY MOUTH TWICE DAILY. (Patient taking differently: TAKE ONE TABLET BY MOUTH DAILY) 90 tablet 3  . metoprolol tartrate  (LOPRESSOR) 100 MG tablet Take 1 tablet by mouth 2 (two) times daily.  2  . nitroGLYCERIN (NITROSTAT) 0.4 MG SL tablet Place 0.4 mg under the tongue every 5 (five) minutes as needed for chest pain.   10  . oxyCODONE-acetaminophen (PERCOCET/ROXICET) 5-325 MG tablet Take 1 tablet by mouth every 6 (six) hours as needed for severe pain. 12 tablet 0  . polyethylene glycol powder (GLYCOLAX/MIRALAX) powder Take 17 g by mouth daily. 255 g 0  . potassium chloride SA (K-DUR,KLOR-CON) 20 MEQ tablet Take 20 mEq by mouth daily.    . timolol (TIMOPTIC) 0.25 % ophthalmic solution Place 1 drop into the left eye every morning.     . vitamin B-12 (CYANOCOBALAMIN) 1000 MCG tablet Take 2,000 mcg by mouth daily.     No current facility-administered medications for this visit.      ROS:  See HPI  Physical Exam:  Vitals:   01/12/18 1553  BP: 104/60  Pulse: 76  Resp: 16  Temp: (!) 97.3 F (36.3 C)  SpO2: 100%    Incision:  Clean and dry - one small superficial area on the medial portion of the incision - unable to be probed.   Extremities:  Moving stump without difficulty.   Assessment/Plan:  This is a 77 y.o. male who is s/p: right AKA 12/12/17 by Dr. Trula Slade  -pt doing well.  He does have a superficial area on the medial portion of the incision that we will give a couple more weeks to heal, but feel like he will be ready to start the prosthesis process in 2-3 weeks.   -Dr. Trula Slade will see him back in 3 months.     Leontine Locket, PA-C [facility] [phone]  Clinic MD:  Pt seen and examined with dr. Trula Slade

## 2018-01-16 ENCOUNTER — Other Ambulatory Visit: Payer: Self-pay | Admitting: Cardiology

## 2018-04-13 ENCOUNTER — Ambulatory Visit (INDEPENDENT_AMBULATORY_CARE_PROVIDER_SITE_OTHER): Payer: Medicare Other | Admitting: Surgery

## 2018-04-13 ENCOUNTER — Encounter: Payer: Self-pay | Admitting: Surgery

## 2018-04-13 VITALS — BP 140/66 | HR 52 | Temp 97.5°F | Resp 17 | Ht 66.0 in | Wt 185.0 lb

## 2018-04-13 DIAGNOSIS — I739 Peripheral vascular disease, unspecified: Secondary | ICD-10-CM | POA: Diagnosis not present

## 2018-04-13 DIAGNOSIS — Z89611 Acquired absence of right leg above knee: Secondary | ICD-10-CM | POA: Diagnosis not present

## 2018-04-13 DIAGNOSIS — S78111A Complete traumatic amputation at level between right hip and knee, initial encounter: Secondary | ICD-10-CM

## 2018-04-13 NOTE — Progress Notes (Signed)
Established Critical Limb Ischemia Patient   History of Present Illness   Marcus Hendrix is a 77 y.o. (07/28/1941) male who presents to clinic to recheck right lower extremity stump.  He is status post right AKA by Dr. Trula Slade 11/2017.  He also has history of right iliac artery stenting.  He also has a known left SFA occlusion.  Patient denies any wounds of right AKA stump.  Last week he finally received his prosthetic leg and has been able to stand and walk short distances with a walker.  More intensive gait training will begin next week.  Patient also denies any rest pain or active tissue ischemia of left foot.  Past medical history significant for insulin-dependent diabetes mellitus.  The patient's PMH, PSH, SH, and FamHx were reviewed today are unchanged from prior visit.  Current Outpatient Medications  Medication Sig Dispense Refill  . Amino Acids-Protein Hydrolys (FEEDING SUPPLEMENT, PRO-STAT SUGAR FREE 64,) LIQD Take 30 mLs by mouth 2 (two) times daily. 900 mL 0  . amLODipine (NORVASC) 10 MG tablet Take 10 mg by mouth daily.      [name] aspirin 81 MG tablet Take 81 mg by mouth daily.    [name] atorvastatin (LIPITOR) 80 MG tablet TAKE (1) TABLET BY MOUTH ONCE DAILY. 90 tablet 0  . carvedilol (COREG) 12.5 MG tablet Take 1 tablet (12.5 mg total) by mouth 2 (two) times daily with a meal. 60 tablet 0  . cephALEXin (KEFLEX) 500 MG capsule Take 1 capsule by mouth 4 (four) times daily.  0  . furosemide (LASIX) 40 MG tablet Take 40 mg by mouth daily.      [name] gabapentin (NEURONTIN) 100 MG capsule Take 2 capsules (200 mg total) by mouth 3 (three) times daily. 180 capsule 0  . guaiFENesin-dextromethorphan (ROBITUSSIN DM) 100-10 MG/5ML syrup Take 5 mLs by mouth every 4 (four) hours as needed for cough. 118 mL 0  . Hydrocortisone (GERHARDT'S BUTT CREAM) CREA Apply 1 application topically 3 (three) times daily. 1 each 0  . insulin glargine (LANTUS) 100 UNIT/ML injection Inject 0.15 mLs (15 Units total)  into the skin every morning. 10 mL 0  . lisinopril (PRINIVIL,ZESTRIL) 20 MG tablet TAKE ONE TABLET BY MOUTH TWICE DAILY. (Patient taking differently: TAKE ONE TABLET BY MOUTH DAILY) 90 tablet 3  . metoprolol tartrate (LOPRESSOR) 100 MG tablet Take 1 tablet by mouth 2 (two) times daily.  2  . nitroGLYCERIN (NITROSTAT) 0.4 MG SL tablet Place 0.4 mg under the tongue every 5 (five) minutes as needed for chest pain.   10  . oxyCODONE-acetaminophen (PERCOCET/ROXICET) 5-325 MG tablet Take 1 tablet by mouth every 6 (six) hours as needed for severe pain. 12 tablet 0  . polyethylene glycol powder (GLYCOLAX/MIRALAX) powder Take 17 g by mouth daily. 255 g 0  . potassium chloride SA (K-DUR,KLOR-CON) 20 MEQ tablet Take 20 mEq by mouth daily.    . timolol (TIMOPTIC) 0.25 % ophthalmic solution Place 1 drop into the left eye every morning.     . vitamin B-12 (CYANOCOBALAMIN) 1000 MCG tablet Take 2,000 mcg by mouth daily.     No current facility-administered medications for this visit.     On ROS today: 10 system ROS is negative unless otherwise noted in HPI   Physical Examination   Vitals:   04/13/18 1251  BP: 140/66  Pulse: (!) 52  Resp: 17  Temp: (!) 97.5 F (36.4 C)  TempSrc: Oral  SpO2: 96%  Weight: 185 lb (83.9  kg)  Height: 5\' 6"  (1.676 m)   Body mass index is 29.86 kg/m.  General Alert, O x 3, WD,   Pulmonary Sym exp, good B air movt,   Cardiac RRR, Nl S1, S2,   Vascular Vessel Right Left  Aorta Not palpable [name]  Femoral Not examined Not examined  Popliteal AKA Not palpable  PT AKA Not palpable  DP AKA Not palpable    Gastro- intestinal soft, non-distended, non-tender to palpation,   Musculo- skeletal  right AKA stump without any wounds or pressure points; left foot warm to touch despite lack of pedal pulses without any active tissue ischemia  Neurologic Cranial nerves 2-12 intact     Medical Decision Making   Marcus Hendrix is a 77 y.o. male who presents for recheck of  right AKA stump   Right AKA well-healed  Patient has received prosthetic and will begin gait training next week  No rest pain or active tissue ischemia of left lower extremity with known left SFA occlusion  Return to clinic if wounds develop right AKA stump or left foot  Recheck ABIs in 1 year   Dagoberto Ligas, PA-C [facility] Office: [phone]

## 2018-04-21 ENCOUNTER — Other Ambulatory Visit: Payer: Self-pay

## 2018-04-21 ENCOUNTER — Ambulatory Visit: Payer: Medicare Other | Attending: Surgery | Admitting: Physical Therapy

## 2018-04-21 ENCOUNTER — Encounter: Payer: Self-pay | Admitting: Physical Therapy

## 2018-04-21 DIAGNOSIS — M6281 Muscle weakness (generalized): Secondary | ICD-10-CM | POA: Diagnosis present

## 2018-04-21 DIAGNOSIS — R2689 Other abnormalities of gait and mobility: Secondary | ICD-10-CM | POA: Insufficient documentation

## 2018-04-21 DIAGNOSIS — R2681 Unsteadiness on feet: Secondary | ICD-10-CM | POA: Diagnosis present

## 2018-04-21 DIAGNOSIS — R293 Abnormal posture: Secondary | ICD-10-CM | POA: Diagnosis present

## 2018-04-22 NOTE — Therapy (Signed)
[facility] [address] [place], [place], [address] Phone: [phone]   Fax:  [phone]  Physical Therapy Evaluation  Patient Details  Name: Marcus Hendrix MRN: [id number] Date of Birth: 1941/01/21 Referring Provider: Harold Barban, MD   Encounter Date: 04/21/2018  PT End of Session - 04/21/18 1714    Visit Number  1    Number of Visits  25    Date for PT Re-Evaluation  07/20/18    Authorization Type  [employer] Medicare    PT Start Time  9242    PT Stop Time  1528    PT Time Calculation (min)  43 min    Equipment Utilized During Treatment  Gait belt    Activity Tolerance  Patient tolerated treatment well    Behavior During Therapy  [facility] for tasks assessed/performed       Past Medical History:  Diagnosis Date  . Cerebrovascular disease   . Diabetes mellitus   . Diabetes mellitus ([place])   . ED (erectile dysfunction)   . Glaucoma, left eye   . History of atherosclerotic cardiovascular disease   . History of myocardial infarction   . Hyperlipidemia   . Hypertension   . Inguinal hernia    Right  . Normocytic anemia   . Peripheral neuropathy   . PVD (peripheral vascular disease) ([place])   . Tubulovillous adenoma of colon     Past Surgical History:  Procedure Laterality Date  . AMPUTATION Right 12/12/2017   Procedure: AMPUTATION ABOVE KNEE RIGHT;  Surgeon: Serafina Mitchell, MD;  Location: [facility];  Service: Vascular;  Laterality: Right;  . CHOLECYSTECTOMY    . COLONOSCOPY [name] 07/22/2014   Procedure: COLONOSCOPY;  Surgeon: [name], MD;  [facility];  Service: Endoscopy;  Laterality: [name];  1235-moved to [place] notified pt  . CORONARY ARTERY BYPASS GRAFT  2002   x6  . INGUINAL HERNIA REPAIR  2004   Left  . LOWER EXTREMITY ANGIOGRAPHY [name] 12/08/2017   Procedure: LOWER EXTREMITY ANGIOGRAPHY;  Surgeon: Elam Dutch, MD;  Location: [facility];  Service: Cardiovascular;  Laterality:  [name];  . PERIPHERAL VASCULAR INTERVENTION Bilateral 12/08/2017   Procedure: PERIPHERAL VASCULAR INTERVENTION;  Surgeon: Elam Dutch, MD;  Location: [facility];  Service: Cardiovascular;  Laterality: Bilateral;  [place]    There were no vitals filed for this visit.   Subjective Assessment - 04/21/18 1452    Subjective  This 77yo male was referred to Physical Therapy by Harold Barban, MD for prosthetic training. He underwent a right Transfemoral Amputation on 12/12/2017 and received his first prosthesis on 04/08/2018.     Patient is accompained by:  Family member wife, [name]    Pertinent History  R TFA, CAD, cardiac arrest, CABG x2, DM, HTN, peripheral neuropathy, bradycardia, glaucoma, PVD     Limitations  Lifting;Standing;Walking;House hold activities    Patient Stated Goals  To walk with prosthesis around house, yard & store, fishing    Currently in Pain?  No/denies         [facility] PT Assessment - 04/21/18 1445      Assessment   Medical Diagnosis  Right Transfemoral Amputation    Referring Provider  Harold Barban, MD    Onset Date/Surgical Date  04/08/18 prosthesis delivery    Hand Dominance  Right    Prior Therapy  pre-prosthetic HHPT      Precautions   Precautions  Fall  Balance Screen   Has the patient fallen in the past 6 months  No    Has the patient had a decrease in activity level because of a fear of falling?   Yes    Is the patient reluctant to leave their home because of a fear of falling?   Yes      [place]  Private residence    Living Arrangements  Spouse/significant other    Type of [place] entrance back door 5 steps right [place]  One level    Home Equipment  Wheelchair - manual;Walker - 2 wheels;Cane - single point;Bedside commode;Grab bars - tub/shower      Prior Function   Level of Independence  Independent;Independent with household mobility without  device;Independent with community mobility without device    Vocation  Retired    Leisure  fishing, mowing grass,       Posture/Postural Control   Posture/Postural Control  Postural limitations    Postural Limitations  Rounded Shoulders;Forward head;Flexed trunk;Weight shift left      ROM / Strength   AROM / PROM / Strength  AROM;Strength      AROM   Overall AROM Comments  Functionally appears to have hip flexor, left hamstring & left gastroc tightness.       Strength   Overall Strength  Deficits    Overall Strength Comments  right hip grossly 4/5, left ankle DF 4-/5      Transfers   Transfers  Sit to Stand;Stand to Sit    Sit to Stand  5: Supervision;With upper extremity assist;With armrests;From chair/3-in-1 uses RW & back of legs against chair to stabilize    Sit to Stand Details (indicate cue type and reason)  verbal cues on prosthetic knee control    Stand to Sit  5: Supervision;With upper extremity assist;With armrests;To chair/3-in-1 uses RW for stability    Stand to Sit Details (indicate cue type and reason)  Verbal cues for technique;Verbal cues for sequencing    Stand to Sit Details  verbal cues on prosthetic knee control      Ambulation/Gait   Ambulation/Gait  Yes    Ambulation/Gait Assistance  4: Min assist;5: Supervision initially [place] progressed to supervision    Ambulation/Gait Assistance Details  excessive UE weight bearing on RW, verbal cues how to stabilize / extend prosthetic knee in stance.     Ambulation Distance (Feet)  30 Feet    Assistive device  Rolling walker;Prosthesis    Gait Pattern  Step-to pattern;Decreased step length - left;Decreased stance time - right;Decreased stride length;Decreased hip/knee flexion - right;Decreased weight shift to right;Right hip hike;Right circumduction;Right flexed knee in stance;Antalgic;Lateral hip instability;Decreased trunk rotation;Trunk flexed;Abducted- right;Poor foot clearance - right    Ambulation Surface   Indoor;Level    Gait velocity  0.29 ft/sec      Standardized Balance Assessment   Standardized Balance Assessment  Berg Balance Test      Berg Balance Test   Sit to Stand  Needs minimal aid to stand or to stabilize with RW = 3    Standing Unsupported  Needs several tries to stand 30 seconds unsupported with RW = 3    Sitting with Back Unsupported but Feet Supported on Floor or Stool  Able to sit safely and securely 2 minutes    Stand to Sit  Sits independently, has uncontrolled descent with RW = 3  Transfers  Able to transfer safely, definite need of hands    Standing Unsupported with Eyes Closed  Needs help to keep from falling with RW = 3    Standing Ubsupported with Feet Together  Needs help to attain position and unable to hold for 15 seconds with RW = 3    From Standing, Reach Forward with Outstretched Arm  Loses balance while trying/requires external support wiht RW = 2    From Standing Position, Pick up Object from Floor  Unable to try/needs assist to keep balance with RW = 3    From Standing Position, Turn to Look Behind Over each Shoulder  Needs assist to keep from losing balance and falling with RW = 2    Turn 360 Degrees  Needs assistance while turning with RW = 0    Standing Unsupported, Alternately Place Feet on Step/Stool  Needs assistance to keep from falling or unable to try with RW = 0    Standing Unsupported, One Foot in [employer] balance while stepping or standing with RW = 2    Standing on One Leg  Unable to try or needs assist to prevent fall with RW 5 sec standing on sound limb & 1 sec on prosthesis    Total Score  10    Berg comment:  performs tasks with RW support 32      Prosthetics Assessment - 04/21/18 1445      Prosthetics   Prosthetic Care Dependent with  Skin check;Residual limb care;Prosthetic cleaning;Ply sock cleaning;Care of non-amputated limb;Correct ply sock adjustment;Proper wear schedule/adjustment;Proper weight-bearing schedule/adjustment     Donning prosthesis   Min assist    Doffing prosthesis   Supervision    Current prosthetic wear tolerance (days/week)   4 of 14 days since delivery    Current prosthetic wear tolerance (#hours/day)   30-45 minutes 1x/day on days he has worn prosthesis    Current prosthetic weight-bearing tolerance (hours/day)   Patient tolerates 5 min of standing with RW support & partial weight on prosthesis without c/o limb pain or discomfort    Edema  non-pitting edema in limb    Residual limb condition   dry skin, light hair growth, PT removed dry scab from incision without skin opening underneath, normal color & temperature               Objective measurements completed on examination: See above findings.      [facility] Adult PT Treatment/Exercise - 04/21/18 1445      Prosthetics   Education Provided  Skin check;Residual limb care;Prosthetic cleaning;Correct ply sock adjustment;Proper Donning;Proper wear schedule/adjustment Wear initially 2hrs 2x/day with weekly increase if no issues    Person(s) Educated  Patient;Spouse    Education Method  Explanation;Demonstration;Tactile cues;Verbal cues    Education Method  Verbalized understanding;Verbal cues required;Needs further instruction               PT Short Term Goals - 04/21/18 2007      PT SHORT TERM GOAL #1   Title  Patient donnes prosthesis properly with RW support modified independent. (All STGs Target Date: 05/21/2018)    Time  4    Status  New    Target Date  05/21/18      PT SHORT TERM GOAL #2   Title  Patient tolerates prosthesis wear >8 hrs total/day without skin issues or limb pain.     Time  4    Period  Weeks    Status  New    Target Date  05/21/18      PT SHORT TERM GOAL #3   Title  Patient perform standing balance with RW support & prosthesis reaching 10" anteriorly, to floor & manages clothes safely modified independent.     Time  4    Period  Weeks    Status  New    Target Date  05/21/18      PT SHORT TERM  GOAL #4   Title  Patient ambulates 100' with RW & prosthesis with supervision.     Time  4    Period  Weeks    Status  New    Target Date  05/21/18      PT SHORT TERM GOAL #5   Title  Patient negotiates stairs with 2 rails, ramps & curbs with RW & prosthesis with minA.     Time  4    Period  Weeks    Status  New    Target Date  05/21/18        PT Long Term Goals - 04/21/18 2002      PT LONG TERM GOAL #1   Title  Patient verbalizes & demonstrates proper prosthetic care to enable safe use of prosthesis. (All LTGs Target Date: 07/17/2018)    Time  12    Period  Weeks    Status  New    Target Date  07/17/18      PT LONG TERM GOAL #2   Title  Patient tolerates prosthesis wear >80% of awake hours without skin issues or limb pain to enable function throughout his day.     Time  12    Period  Weeks    Status  New    Target Date  07/17/18      PT LONG TERM GOAL #3   Title  Berg Balance >25/56 to reduce fall risk.     Time  12    Period  Weeks    Status  New    Target Date  07/17/18      PT LONG TERM GOAL #4   Title  Patient ambulates 300' with LRAD & Prosthesis outdoors including grass modified independent for community mobility.     Time  12    Period  Weeks    Status  New    Target Date  07/17/18      PT LONG TERM GOAL #5   Title  Patient negotiates ramps, curbs & stairs with LRAD & prosthesis modified independent to enable community access.     Time  12    Period  Weeks    Status  New    Target Date  07/17/18      Additional Long Term Goals   Additional Long Term Goals  Yes      PT LONG TERM GOAL #6   Title  Patient ambulates around furniture carrying cup of water with LRAD & prosthesis modified independent for household mobility.     Time  12    Period  Weeks    Status  New    Target Date  07/17/18             Plan - 04/21/18 1955    Clinical Impression Statement  This 77yo male received his first prosthesis 14 days ago and is dependent in proper care  & use. He has only worn prosthesis 4 of 14 days since delivery for 30-45 minutes which limits his ability to function during his  day. His balance is impaired as noted by Merrilee Jansky Balance score 10/56 which indicates high fall risk & dependency in ADLs. Standing balance with RW support is limited & impaired. Patient's gait has deviations including poor prosthesis control and gait velocity of 0.29 ft/sec indicating dependency & limited function. Patient would benefit from skilled PT to improve function & safety with prosthesis.     History and Personal Factors relevant to plan of care:  R TFA, CAD, cardiac arrest, CABG x2, DM, HTN, peripheral neuropathy, bradycardia, glaucoma, PVD    Clinical Presentation  Evolving    Clinical Presentation due to:  high fall risk, dependency in prosthesis use & care, multiple medical issues    Clinical Decision Making  Moderate    Rehab Potential  Good    PT Frequency  2x / week    PT Duration  12 weeks    PT Treatment/Interventions  ADLs/Self Care Home Management;DME Instruction;Gait training;Stair training;Canalith Repostioning;Functional mobility training;Therapeutic activities;Therapeutic exercise;Balance training;Neuromuscular re-education;Patient/family education;Prosthetic Training;Vestibular    PT Next Visit Plan  review prosthetic care, HEP at sink, prosthetic gait with RW, instruct in stairs, ramps & curbs    Consulted and Agree with Plan of Care  Patient;Family member/caregiver    Family Member Consulted  wife, Benjamine Mola       Patient will benefit from skilled therapeutic intervention in order to improve the following deficits and impairments:  Abnormal gait, Decreased activity tolerance, Decreased balance, Decreased endurance, Decreased knowledge of use of DME, Decreased mobility, Impaired flexibility, Decreased strength, Postural dysfunction, Prosthetic Dependency  Visit Diagnosis: Muscle weakness (generalized)  Unsteadiness on feet  Other abnormalities  of gait and mobility  Abnormal posture     Problem List Patient Active Problem List   Diagnosis Date Noted  . Pain of right lower extremity   . Type 2 diabetes mellitus with peripheral neuropathy (HCC)   . Benign essential HTN   . Coronary artery disease involving coronary bypass graft of native heart without angina pectoris   . Chronic combined systolic and diastolic congestive heart failure ([place])   . Leukocytosis   . Transaminitis   . Unilateral AKA, right ([place])   . Cardiac arrest ([place])   . Bradycardia 12/06/2017  . History of atherosclerotic cardiovascular disease 12/06/2017  . Diabetic foot infection ([place]) 11/28/2017  . Acute CHF (congestive heart failure) ([place]) 11/28/2017  . Rectal bleeding 10/04/2013  . Heme positive stool 10/04/2013  . Community acquired pneumonia 07/30/2013  . DM type 2 (diabetes mellitus, type 2) ([place]) 07/30/2013  . HTN (hypertension), benign 07/30/2013  . CAROTID BRUITS, BILATERAL 07/16/2010  . MITRAL VALVE PROLAPSE 08/22/2009  . DM 07/22/2009  . Atherosclerotic cardiovascular disease 07/22/2009  . 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 ERECTILE DYSFUNCTION 05/27/2008  . INGUINAL HERNIA, RIGHT 10/19/2007  . ANEMIA, NORMOCYTIC 10/09/2007  . Dyslipidemia 12/19/2006  . PERIPHERAL NEUROPATHY 12/19/2006  . Unspecified glaucoma 12/19/2006  . Essential hypertension 12/19/2006  . MYOCARDIAL INFARCTION, HX OF 12/19/2006  . PERIPHERAL VASCULAR DISEASE 12/19/2006    Jamey Reas PT, DPT 04/22/2018, 10:11 AM  [facility] [address] [place], [place], [address] Phone: [phone]   Fax:  [phone]  Name: DONYE CAMPANELLI MRN: [id number] Date of Birth: 23-Oct-1941

## 2018-04-28 ENCOUNTER — Ambulatory Visit: Payer: Medicare Other | Admitting: Physical Therapy

## 2018-04-28 ENCOUNTER — Encounter: Payer: Self-pay | Admitting: Physical Therapy

## 2018-04-28 DIAGNOSIS — M6281 Muscle weakness (generalized): Secondary | ICD-10-CM

## 2018-04-28 DIAGNOSIS — R2689 Other abnormalities of gait and mobility: Secondary | ICD-10-CM

## 2018-04-28 DIAGNOSIS — R293 Abnormal posture: Secondary | ICD-10-CM

## 2018-04-28 DIAGNOSIS — R2681 Unsteadiness on feet: Secondary | ICD-10-CM

## 2018-04-29 NOTE — Therapy (Signed)
[facility] [address] [place], [place], [address] Phone: [phone]   Fax:  [phone]  Physical Therapy Treatment  Patient Details  Name: Marcus Hendrix MRN: [id number] Date of Birth: [birth year] Referring Provider: Harold Barban, MD   Encounter Date: 04/28/2018  PT End of Session - 04/28/18 1803    Visit Number  2    Number of Visits  25    Date for PT Re-Evaluation  07/20/18    Authorization Type  BCBS Medicare    PT Start Time  1403    PT Stop Time  1445    PT Time Calculation (min)  42 min    Equipment Utilized During Treatment  Gait belt    Activity Tolerance  Patient tolerated treatment well    Behavior During Therapy  [facility] for tasks assessed/performed       Past Medical History:  Diagnosis Date  . Cerebrovascular disease   . Diabetes mellitus   . Diabetes mellitus ([place])   . ED (erectile dysfunction)   . Glaucoma, left eye   . History of atherosclerotic cardiovascular disease   . History of myocardial infarction   . Hyperlipidemia   . Hypertension   . Inguinal hernia    Right  . Normocytic anemia   . Peripheral neuropathy   . PVD (peripheral vascular disease) ([place])   . Tubulovillous adenoma of colon     Past Surgical History:  Procedure Laterality Date  . AMPUTATION Right 12/12/2017   Procedure: AMPUTATION ABOVE KNEE RIGHT;  Surgeon: Serafina Mitchell, MD;  Location: [facility];  Service: Vascular;  Laterality: Right;  . CHOLECYSTECTOMY    . COLONOSCOPY [name] 07/22/2014   Procedure: COLONOSCOPY;  Surgeon: [name], MD;  [facility];  Service: Endoscopy;  Laterality: [name];  1235-moved to [place] notified pt  . CORONARY ARTERY BYPASS GRAFT  2002   x6  . INGUINAL HERNIA REPAIR  2004   Left  . LOWER EXTREMITY ANGIOGRAPHY [name] 12/08/2017   Procedure: LOWER EXTREMITY ANGIOGRAPHY;  Surgeon: Elam Dutch, MD;  Location: [facility];  Service: Cardiovascular;  Laterality:  [name];  . PERIPHERAL VASCULAR INTERVENTION Bilateral 12/08/2017   Procedure: PERIPHERAL VASCULAR INTERVENTION;  Surgeon: Elam Dutch, MD;  Location: [facility];  Service: Cardiovascular;  Laterality: Bilateral;  [place]    There were no vitals filed for this visit.  Subjective Assessment - 04/28/18 1406    Subjective  No falls. He has worn prosthesis daily 2 hrs 2x/day.     Patient is accompained by:  Family member    Pertinent History  R TFA, CAD, cardiac arrest, CABG x2, DM, HTN, peripheral neuropathy, bradycardia, glaucoma, PVD     Limitations  Lifting;Standing;Walking;House hold activities    Patient Stated Goals  To walk with prosthesis around house, yard & store, fishing    Currently in Pain?  No/denies                       [facility] Adult PT Treatment/Exercise - 04/28/18 1400      Transfers   Transfers  Sit to Stand;Stand to Sit    Sit to Stand  5: Supervision;4: Min guard;With upper extremity assist;With armrests;From chair/3-in-1 to RW for stabilization,  chairs with & without armrests    Sit to Stand Details (indicate cue type and reason)  PT demo & verbal cues on technique to stand from chairs with armrests, without armrests and  from simulated couch with left armrest.     Stand to Sit  5: Supervision;4: Min guard;With upper extremity assist;With armrests;To chair/3-in-1 from RW, chairs with & without armrests    Stand to Sit Details  PT demo & verbal cues on technique to sit to chairs with armrests, without armrests and from simulated couch with left armrest.       Ambulation/Gait   Ambulation/Gait  Yes    Ambulation/Gait Assistance  4: Min guard;5: Supervision    Ambulation/Gait Assistance Details  Verbal & tactile cues on initial contact with heel to stabilize prosthetic knee, weight shift over prosthesis in stance, upright posture and step length /width.     Ambulation Distance (Feet)  120 Feet    Assistive device  Rolling walker;Prosthesis     Ambulation Surface  Indoor;Level    Stairs  Yes    Stairs Assistance  4: Min assist RW & TFA prosthesis    Stairs Assistance Details (indicate cue type and reason)  demo & verbal cues on technique with TFA prosthesis    Stair Management Technique  Two rails;Step to pattern;Forwards    Number of Stairs  4    Ramp  4: Min assist RW & TFA prosthesis    Ramp Details (indicate cue type and reason)  PT demo & verbal cues on technique including posture & weight shift    Curb  4: Min assist RW & prosthesis    Curb Details (indicate cue type and reason)  PT demo & verbal cues on technique including posture, step length & weight shift.       Prosthetics   Prosthetic Care Comments   increase wear to 3 hrs 2x/day.     Current prosthetic wear tolerance (days/week)   daily    Current prosthetic wear tolerance (#hours/day)   2 hrs 2x/day    Residual limb condition   dry skin, light hair growth, PT removed dry scab from incision without skin opening underneath, normal color & temperature    Education Provided  Proper Donning;Proper wear schedule/adjustment    Person(s) Educated  Patient;Spouse    Education Method  Explanation;Verbal cues    Education Method  Verbalized understanding;Verbal cues required;Needs further instruction               PT Short Term Goals - 04/28/18 1815      PT SHORT TERM GOAL #1   Title  Patient donnes prosthesis properly with RW support modified independent. (All STGs Target Date: 05/21/2018)    Time  4    Status  On-going    Target Date  05/21/18      PT SHORT TERM GOAL #2   Title  Patient tolerates prosthesis wear >8 hrs total/day without skin issues or limb pain.     Time  4    Period  Weeks    Status  On-going    Target Date  05/21/18      PT SHORT TERM GOAL #3   Title  Patient perform standing balance with RW support & prosthesis reaching 10" anteriorly, to floor & manages clothes safely modified independent.     Time  4    Period  Weeks    Status   On-going    Target Date  05/21/18      PT SHORT TERM GOAL #4   Title  Patient ambulates 200' with RW & prosthesis with supervision.     Time  4    Period  Weeks  Status  Revised    Target Date  05/21/18      PT SHORT TERM GOAL #5   Title  Patient negotiates stairs with 2 rails, ramps & curbs with RW & prosthesis with minA.     Time  4    Period  Weeks    Status  On-going    Target Date  05/21/18        PT Long Term Goals - 04/28/18 1816      PT LONG TERM GOAL #1   Title  Patient verbalizes & demonstrates proper prosthetic care to enable safe use of prosthesis. (All LTGs Target Date: 07/17/2018)    Time  12    Period  Weeks    Status  On-going    Target Date  07/17/18      PT LONG TERM GOAL #2   Title  Patient tolerates prosthesis wear >80% of awake hours without skin issues or limb pain to enable function throughout his day.     Time  12    Period  Weeks    Status  On-going    Target Date  07/17/18      PT LONG TERM GOAL #3   Title  Berg Balance >25/56 to reduce fall risk.     Time  12    Period  Weeks    Status  On-going    Target Date  07/17/18      PT LONG TERM GOAL #4   Title  Patient ambulates 500' with LRAD & Prosthesis outdoors including grass modified independent for community mobility.     Time  12    Period  Weeks    Status  Revised    Target Date  07/17/18      PT LONG TERM GOAL #5   Title  Patient negotiates ramps, curbs & stairs with LRAD & prosthesis modified independent to enable community access.     Time  12    Period  Weeks    Status  On-going    Target Date  07/17/18      PT LONG TERM GOAL #6   Title  Patient ambulates around furniture carrying cup of water with LRAD & prosthesis modified independent for household mobility.     Time  12    Period  Weeks    Status  On-going    Target Date  07/17/18            Plan - 04/28/18 1817    Clinical Impression Statement  With skilled care, patient was able to improve prosthetic gait  with RW. He also was able to initiate ability to negotiate stairs, ramps & curbs with skilled instruction.     Rehab Potential  Good    PT Frequency  2x / week    PT Duration  12 weeks    PT Treatment/Interventions  ADLs/Self Care Home Management;DME Instruction;Gait training;Stair training;Canalith Repostioning;Functional mobility training;Therapeutic activities;Therapeutic exercise;Balance training;Neuromuscular re-education;Patient/family education;Prosthetic Training;Vestibular    PT Next Visit Plan  review prosthetic care, HEP at sink, prosthetic gait with RW, stairs, ramps & curbs    Consulted and Agree with Plan of Care  Patient;Family member/caregiver    Family Member Consulted  wife, Benjamine Mola       Patient will benefit from skilled therapeutic intervention in order to improve the following deficits and impairments:  Abnormal gait, Decreased activity tolerance, Decreased balance, Decreased endurance, Decreased knowledge of use of DME, Decreased mobility, Impaired flexibility, Decreased strength, Postural dysfunction, Prosthetic  Dependency  Visit Diagnosis: Muscle weakness (generalized)  Unsteadiness on feet  Other abnormalities of gait and mobility  Abnormal posture     Problem List Patient Active Problem List   Diagnosis Date Noted  . Pain of right lower extremity   . Type 2 diabetes mellitus with peripheral neuropathy (HCC)   . Benign essential HTN   . Coronary artery disease involving coronary bypass graft of native heart without angina pectoris   . Chronic combined systolic and diastolic congestive heart failure ([place])   . Leukocytosis   . Transaminitis   . Unilateral AKA, right ([place])   . Cardiac arrest ([place])   . Bradycardia 12/06/2017  . History of atherosclerotic cardiovascular disease 12/06/2017  . Diabetic foot infection ([place]) 11/28/2017  . Acute CHF (congestive heart failure) ([place]) 11/28/2017  . Rectal bleeding 10/04/2013  . Heme positive stool 10/04/2013   . Community acquired pneumonia 07/30/2013  . DM type 2 (diabetes mellitus, type 2) ([place]) 07/30/2013  . HTN (hypertension), benign 07/30/2013  . CAROTID BRUITS, BILATERAL 07/16/2010  . MITRAL VALVE PROLAPSE 08/22/2009  . DM 07/22/2009  . Atherosclerotic cardiovascular disease 07/22/2009  . 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 ERECTILE DYSFUNCTION 05/27/2008  . INGUINAL HERNIA, RIGHT 10/19/2007  . ANEMIA, NORMOCYTIC 10/09/2007  . Dyslipidemia 12/19/2006  . PERIPHERAL NEUROPATHY 12/19/2006  . Unspecified glaucoma 12/19/2006  . Essential hypertension 12/19/2006  . MYOCARDIAL INFARCTION, HX OF 12/19/2006  . PERIPHERAL VASCULAR DISEASE 12/19/2006    Jamey Reas PT, DPT 04/29/2018, 12:21 PM  [facility] [address] [place], [place], [address] Phone: [phone]   Fax:  [phone]  Name: Marcus Hendrix MRN: [id number] Date of Birth: [birth year]

## 2018-05-04 ENCOUNTER — Ambulatory Visit: Payer: Medicare Other | Admitting: Physical Therapy

## 2018-05-04 ENCOUNTER — Encounter: Payer: Self-pay | Admitting: Physical Therapy

## 2018-05-04 DIAGNOSIS — R2689 Other abnormalities of gait and mobility: Secondary | ICD-10-CM

## 2018-05-04 DIAGNOSIS — M6281 Muscle weakness (generalized): Secondary | ICD-10-CM

## 2018-05-04 DIAGNOSIS — R2681 Unsteadiness on feet: Secondary | ICD-10-CM

## 2018-05-04 DIAGNOSIS — R293 Abnormal posture: Secondary | ICD-10-CM

## 2018-05-04 NOTE — Therapy (Signed)
[facility] [address] [place], [place], [address] Phone: [phone]   Fax:  [phone]  Physical Therapy Treatment  Patient Details  Name: Marcus Hendrix MRN: [id number] Date of Birth: [birth year] Referring Provider: Harold Barban, MD   Encounter Date: 05/04/2018  PT End of Session - 05/04/18 1340    Visit Number  3    Number of Visits  25    Date for PT Re-Evaluation  07/20/18    Authorization Type  BCBS Medicare    PT Start Time  1230    PT Stop Time  1315    PT Time Calculation (min)  45 min    Equipment Utilized During Treatment  Gait belt    Activity Tolerance  Patient tolerated treatment well    Behavior During Therapy  [facility] for tasks assessed/performed       Past Medical History:  Diagnosis Date  . Cerebrovascular disease   . Diabetes mellitus   . Diabetes mellitus ([place])   . ED (erectile dysfunction)   . Glaucoma, left eye   . History of atherosclerotic cardiovascular disease   . History of myocardial infarction   . Hyperlipidemia   . Hypertension   . Inguinal hernia    Right  . Normocytic anemia   . Peripheral neuropathy   . PVD (peripheral vascular disease) ([place])   . Tubulovillous adenoma of colon     Past Surgical History:  Procedure Laterality Date  . AMPUTATION Right 12/12/2017   Procedure: AMPUTATION ABOVE KNEE RIGHT;  Surgeon: Serafina Mitchell, MD;  Location: [facility];  Service: Vascular;  Laterality: Right;  . CHOLECYSTECTOMY    . COLONOSCOPY [name] 07/22/2014   Procedure: COLONOSCOPY;  Surgeon: [name], MD;  [facility];  Service: Endoscopy;  Laterality: [name];  1235-moved to [place] notified pt  . CORONARY ARTERY BYPASS GRAFT  2002   x6  . INGUINAL HERNIA REPAIR  2004   Left  . LOWER EXTREMITY ANGIOGRAPHY [name] 12/08/2017   Procedure: LOWER EXTREMITY ANGIOGRAPHY;  Surgeon: Elam Dutch, MD;  Location: [facility];  Service: Cardiovascular;  Laterality:  [name];  . PERIPHERAL VASCULAR INTERVENTION Bilateral 12/08/2017   Procedure: PERIPHERAL VASCULAR INTERVENTION;  Surgeon: Elam Dutch, MD;  Location: [facility];  Service: Cardiovascular;  Laterality: Bilateral;  [place]    There were no vitals filed for this visit.  Subjective Assessment - 05/04/18 1231    Subjective  No falls. He is wearing prosthesis 3 hrs 2x/day without issues.     Patient is accompained by:  Family member    Pertinent History  R TFA, CAD, cardiac arrest, CABG x2, DM, HTN, peripheral neuropathy, bradycardia, glaucoma, PVD     Limitations  Lifting;Standing;Walking;House hold activities    Patient Stated Goals  To walk with prosthesis around house, yard & store, fishing    Currently in Pain?  No/denies                       [facility] Adult PT Treatment/Exercise - 05/04/18 1230      Transfers   Transfers  Sit to Stand;Stand to Sit    Sit to Stand  5: Supervision;4: Min guard;With upper extremity assist;With armrests;From chair/3-in-1 to RW for stabilization,  chairs with & without armrests    Stand to Sit  5: Supervision;4: Min guard;With upper extremity assist;With armrests;To chair/3-in-1 from RW, chairs with & without armrests  Ambulation/Gait   Ambulation/Gait  Yes    Ambulation/Gait Assistance  5: Supervision    Ambulation/Gait Assistance Details  verbal cues on initial contact with heel, wt shift over prosthesis and step length    Ambulation Distance (Feet)  135 Feet 135' X 1 and 30' X 2    Assistive device  Rolling walker;Prosthesis    Gait Pattern  Step-to pattern;Decreased step length - left;Decreased stance time - right;Decreased stride length;Decreased hip/knee flexion - right;Decreased weight shift to right;Right hip hike;Right circumduction;Right flexed knee in stance;Antalgic;Lateral hip instability;Decreased trunk rotation;Trunk flexed;Abducted- right;Poor foot clearance - right    Ambulation Surface  Indoor;Level    Stairs   Yes    Stairs Assistance  4: Min guard TFA prosthesis    Stairs Assistance Details (indicate cue type and reason)  verbal cues on wt shift over prosthesis in stance    Stair Management Technique  Two rails;Step to pattern;Forwards    Number of Stairs  4    Ramp  4: Min assist RW & TFA prosthesis, 2 reps    Ramp Details (indicate cue type and reason)  PT demo & verbal cues before both reps on technique including upright posture & wt shift over prosthesis in stance    Curb  4: Min assist RW & prosthesis    Curb Details (indicate cue type and reason)  Verbal cues on sequence & wt shift      Self-Care   Self-Care  Lifting    Lifting  PT demo, instructed in lifting light item from floor with TFA prosthesis. Pt return demo understanding with RW support with min guard & verbal cues.  2 reps: 1x RUE & 1xLUE. He reports easier to reach with RUE.       Prosthetics   Prosthetic Care Comments   increase wear to 4 hrs 2x/day.     Current prosthetic wear tolerance (days/week)   daily    Current prosthetic wear tolerance (#hours/day)   3 hrs 2x/day    Residual limb condition   dry skin, light hair growth, PT removed dry scab from incision without skin opening underneath, normal color & temperature    Education Provided  Proper Donning;Proper wear schedule/adjustment    Person(s) Educated  Patient;Spouse    Education Method  Explanation;Verbal cues    Education Method  Verbalized understanding;Verbal cues required;Needs further [place] Prosthesis  Supervision             PT Education - 05/04/18 1310    Education provided  Yes    Education Details  loading / unloading RW if driving car     Person(s) Educated  Patient;Spouse    Methods  Explanation;Demonstration;Verbal cues    Comprehension  Verbalized understanding       PT Short Term Goals - 04/28/18 1815      PT SHORT TERM GOAL #1   Title  Patient donnes prosthesis properly with RW support modified independent. (All STGs  Target Date: 05/21/2018)    Time  4    Status  On-going    Target Date  05/21/18      PT SHORT TERM GOAL #2   Title  Patient tolerates prosthesis wear >8 hrs total/day without skin issues or limb pain.     Time  4    Period  Weeks    Status  On-going    Target Date  05/21/18      PT SHORT TERM GOAL #3   Title  Patient  perform standing balance with RW support & prosthesis reaching 10" anteriorly, to floor & manages clothes safely modified independent.     Time  4    Period  Weeks    Status  On-going    Target Date  05/21/18      PT SHORT TERM GOAL #4   Title  Patient ambulates 200' with RW & prosthesis with supervision.     Time  4    Period  Weeks    Status  Revised    Target Date  05/21/18      PT SHORT TERM GOAL #5   Title  Patient negotiates stairs with 2 rails, ramps & curbs with RW & prosthesis with minA.     Time  4    Period  Weeks    Status  On-going    Target Date  05/21/18        PT Long Term Goals - 04/28/18 1816      PT LONG TERM GOAL #1   Title  Patient verbalizes & demonstrates proper prosthetic care to enable safe use of prosthesis. (All LTGs Target Date: 07/17/2018)    Time  12    Period  Weeks    Status  On-going    Target Date  07/17/18      PT LONG TERM GOAL #2   Title  Patient tolerates prosthesis wear >80% of awake hours without skin issues or limb pain to enable function throughout his day.     Time  12    Period  Weeks    Status  On-going    Target Date  07/17/18      PT LONG TERM GOAL #3   Title  Berg Balance >25/56 to reduce fall risk.     Time  12    Period  Weeks    Status  On-going    Target Date  07/17/18      PT LONG TERM GOAL #4   Title  Patient ambulates 500' with LRAD & Prosthesis outdoors including grass modified independent for community mobility.     Time  12    Period  Weeks    Status  Revised    Target Date  07/17/18      PT LONG TERM GOAL #5   Title  Patient negotiates ramps, curbs & stairs with LRAD & prosthesis  modified independent to enable community access.     Time  12    Period  Weeks    Status  On-going    Target Date  07/17/18      PT LONG TERM GOAL #6   Title  Patient ambulates around furniture carrying cup of water with LRAD & prosthesis modified independent for household mobility.     Time  12    Period  Weeks    Status  On-going    Target Date  07/17/18            Plan - 05/04/18 1340    Clinical Impression Statement  Patient appears to be safe ambulating short distances like his home with RW & prosthesis. He needs further training / practice on ramps & curbs before trying at home. His main entrance has ramp that he thinks is ~10' long.  Pt seems to understand instructions how to load a RW if patient is driving.     Rehab Potential  Good    PT Frequency  2x / week    PT Duration  12 weeks  PT Treatment/Interventions  ADLs/Self Care Home Management;DME Instruction;Gait training;Stair training;Canalith Repostioning;Functional mobility training;Therapeutic activities;Therapeutic exercise;Balance training;Neuromuscular re-education;Patient/family education;Prosthetic Training;Vestibular    PT Next Visit Plan  review prosthetic care, HEP at sink, prosthetic gait with RW, stairs, ramps & curbs    Consulted and Agree with Plan of Care  Patient;Family member/caregiver    Family Member Consulted  wife, Benjamine Mola       Patient will benefit from skilled therapeutic intervention in order to improve the following deficits and impairments:  Abnormal gait, Decreased activity tolerance, Decreased balance, Decreased endurance, Decreased knowledge of use of DME, Decreased mobility, Impaired flexibility, Decreased strength, Postural dysfunction, Prosthetic Dependency  Visit Diagnosis: Muscle weakness (generalized)  Unsteadiness on feet  Other abnormalities of gait and mobility  Abnormal posture     Problem List Patient Active Problem List   Diagnosis Date Noted  . Pain of right  lower extremity   . Type 2 diabetes mellitus with peripheral neuropathy (HCC)   . Benign essential HTN   . Coronary artery disease involving coronary bypass graft of native heart without angina pectoris   . Chronic combined systolic and diastolic congestive heart failure ([place])   . Leukocytosis   . Transaminitis   . Unilateral AKA, right ([place])   . Cardiac arrest ([place])   . Bradycardia 12/06/2017  . History of atherosclerotic cardiovascular disease 12/06/2017  . Diabetic foot infection ([place]) 11/28/2017  . Acute CHF (congestive heart failure) ([place]) 11/28/2017  . Rectal bleeding 10/04/2013  . Heme positive stool 10/04/2013  . Community acquired pneumonia 07/30/2013  . DM type 2 (diabetes mellitus, type 2) ([place]) 07/30/2013  . HTN (hypertension), benign 07/30/2013  . CAROTID BRUITS, BILATERAL 07/16/2010  . MITRAL VALVE PROLAPSE 08/22/2009  . DM 07/22/2009  . Atherosclerotic cardiovascular disease 07/22/2009  . 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 ERECTILE DYSFUNCTION 05/27/2008  . INGUINAL HERNIA, RIGHT 10/19/2007  . ANEMIA, NORMOCYTIC 10/09/2007  . Dyslipidemia 12/19/2006  . PERIPHERAL NEUROPATHY 12/19/2006  . Unspecified glaucoma 12/19/2006  . Essential hypertension 12/19/2006  . MYOCARDIAL INFARCTION, HX OF 12/19/2006  . PERIPHERAL VASCULAR DISEASE 12/19/2006    Jamey Reas PT, DPT 05/04/2018, 1:43 PM  [facility] [address] [place], [place], [address] Phone: [phone]   Fax:  [phone]  Name: Marcus Hendrix MRN: [id number] Date of Birth: 04-Aug-1941

## 2018-05-06 ENCOUNTER — Ambulatory Visit: Payer: Medicare Other | Admitting: Physical Therapy

## 2018-05-06 ENCOUNTER — Encounter: Payer: Self-pay | Admitting: Physical Therapy

## 2018-05-06 DIAGNOSIS — R2681 Unsteadiness on feet: Secondary | ICD-10-CM

## 2018-05-06 DIAGNOSIS — M6281 Muscle weakness (generalized): Secondary | ICD-10-CM

## 2018-05-06 DIAGNOSIS — R293 Abnormal posture: Secondary | ICD-10-CM

## 2018-05-06 DIAGNOSIS — R2689 Other abnormalities of gait and mobility: Secondary | ICD-10-CM

## 2018-05-06 NOTE — Therapy (Signed)
[facility] [address] [place], [place], [address] Phone: [phone]   Fax:  [phone]  Physical Therapy Treatment  Patient Details  Name: Marcus Hendrix MRN: [id number] Date of Birth: March 22, 1941 Referring Provider: Harold Barban, MD   Encounter Date: 05/06/2018  PT End of Session - 05/06/18 1246    Visit Number  4    Number of Visits  25    Date for PT Re-Evaluation  07/20/18    Authorization Type  BCBS Medicare    PT Start Time  0930    PT Stop Time  1015    PT Time Calculation (min)  45 min    Equipment Utilized During Treatment  Gait belt    Activity Tolerance  Patient tolerated treatment well    Behavior During Therapy  [facility] for tasks assessed/performed       Past Medical History:  Diagnosis Date  . Cerebrovascular disease   . Diabetes mellitus   . Diabetes mellitus ([place])   . ED (erectile dysfunction)   . Glaucoma, left eye   . History of atherosclerotic cardiovascular disease   . History of myocardial infarction   . Hyperlipidemia   . Hypertension   . Inguinal hernia    Right  . Normocytic anemia   . Peripheral neuropathy   . PVD (peripheral vascular disease) ([place])   . Tubulovillous adenoma of colon     Past Surgical History:  Procedure Laterality Date  . AMPUTATION Right 12/12/2017   Procedure: AMPUTATION ABOVE KNEE RIGHT;  Surgeon: Serafina Mitchell, MD;  Location: [facility];  Service: Vascular;  Laterality: Right;  . CHOLECYSTECTOMY    . COLONOSCOPY [name] 07/22/2014   Procedure: COLONOSCOPY;  Surgeon: [name], MD;  [facility];  Service: Endoscopy;  Laterality: [name];  1235-moved to [place] notified pt  . CORONARY ARTERY BYPASS GRAFT  2002   x6  . INGUINAL HERNIA REPAIR  2004   Left  . LOWER EXTREMITY ANGIOGRAPHY [name] 12/08/2017   Procedure: LOWER EXTREMITY ANGIOGRAPHY;  Surgeon: Elam Dutch, MD;  Location: [facility];  Service: Cardiovascular;  Laterality:  [name];  . PERIPHERAL VASCULAR INTERVENTION Bilateral 12/08/2017   Procedure: PERIPHERAL VASCULAR INTERVENTION;  Surgeon: Elam Dutch, MD;  Location: [facility];  Service: Cardiovascular;  Laterality: Bilateral;  [place]    There were no vitals filed for this visit.  Subjective Assessment - 05/06/18 0932    Subjective  No falls. He is wearing prosthesis 4 hrs 2x/day without issues.     Patient is accompained by:  Family member    Pertinent History  R TFA, CAD, cardiac arrest, CABG x2, DM, HTN, peripheral neuropathy, bradycardia, glaucoma, PVD     Limitations  Lifting;Standing;Walking;House hold activities    Patient Stated Goals  To walk with prosthesis around house, yard & store, fishing    Currently in Pain?  No/denies                       [facility] Adult PT Treatment/Exercise - 05/06/18 0001      Transfers   Transfers  Sit to Stand;Stand to Sit    Sit to Stand  5: Supervision;With upper extremity assist;Without upper extremity assist    Stand to Sit  5: Supervision;With upper extremity assist;Without upper extremity assist      Ambulation/Gait   Ambulation/Gait  Yes    Ambulation/Gait Assistance  4: Min guard    Ambulation/Gait  Assistance Details  verbal cues on initial contact with heel, wt shift over prosthesis, and step length    Ambulation Distance (Feet)  100 Feet +50x3 +100    Assistive device  Rolling walker;Prosthesis    Gait Pattern  Step-to pattern;Decreased step length - left;Decreased stance time - right;Decreased stride length;Decreased hip/knee flexion - right;Decreased weight shift to right;Right hip hike;Right circumduction;Right flexed knee in stance;Antalgic;Lateral hip instability;Decreased trunk rotation;Trunk flexed;Abducted- right;Poor foot clearance - right    Ambulation Surface  Indoor;Paved    Ramp  4: Min assist    Ramp Details (indicate cue type and reason)  PTA demo & verbal cues before both reps on technique including upright  posture & wt shift over prosthesis in stance    Curb  4: Min assist    Curb Details (indicate cue type and reason)  Verbal cues on sequence & wt shift      Prosthetics   Prosthetic Care Comments    wear to 4 hrs 2x/day.     Current prosthetic wear tolerance (days/week)   daily    Current prosthetic wear tolerance (#hours/day)   4 hrs 2x/day    Residual limb condition   skin in good condition per pt    Education Provided  Proper Donning;Proper wear schedule/adjustment reviewed tightening strap    Person(s) Educated  Patient;Other (comment) friend    Education Method  Explanation;Demonstration;Verbal cues    Education Method  Verbalized understanding    Donning Prosthesis  Supervision             PT Education - 05/06/18 1245    Education provided  Yes    Education Details  gait pattern/mechanics with RW and prosthesis.    Person(s) Educated  Patient    Methods  Explanation;Verbal cues;Demonstration    Comprehension  Verbalized understanding       PT Short Term Goals - 04/28/18 1815      PT SHORT TERM GOAL #1   Title  Patient donnes prosthesis properly with RW support modified independent. (All STGs Target Date: 05/21/2018)    Time  4    Status  On-going    Target Date  05/21/18      PT SHORT TERM GOAL #2   Title  Patient tolerates prosthesis wear >8 hrs total/day without skin issues or limb pain.     Time  4    Period  Weeks    Status  On-going    Target Date  05/21/18      PT SHORT TERM GOAL #3   Title  Patient perform standing balance with RW support & prosthesis reaching 10" anteriorly, to floor & manages clothes safely modified independent.     Time  4    Period  Weeks    Status  On-going    Target Date  05/21/18      PT SHORT TERM GOAL #4   Title  Patient ambulates 200' with RW & prosthesis with supervision.     Time  4    Period  Weeks    Status  Revised    Target Date  05/21/18      PT SHORT TERM GOAL #5   Title  Patient negotiates stairs with 2 rails,  ramps & curbs with RW & prosthesis with minA.     Time  4    Period  Weeks    Status  On-going    Target Date  05/21/18        PT Long Term  Goals - 04/28/18 1816      PT LONG TERM GOAL #1   Title  Patient verbalizes & demonstrates proper prosthetic care to enable safe use of prosthesis. (All LTGs Target Date: 07/17/2018)    Time  12    Period  Weeks    Status  On-going    Target Date  07/17/18      PT LONG TERM GOAL #2   Title  Patient tolerates prosthesis wear >80% of awake hours without skin issues or limb pain to enable function throughout his day.     Time  12    Period  Weeks    Status  On-going    Target Date  07/17/18      PT LONG TERM GOAL #3   Title  Berg Balance >25/56 to reduce fall risk.     Time  12    Period  Weeks    Status  On-going    Target Date  07/17/18      PT LONG TERM GOAL #4   Title  Patient ambulates 500' with LRAD & Prosthesis outdoors including grass modified independent for community mobility.     Time  12    Period  Weeks    Status  Revised    Target Date  07/17/18      PT LONG TERM GOAL #5   Title  Patient negotiates ramps, curbs & stairs with LRAD & prosthesis modified independent to enable community access.     Time  12    Period  Weeks    Status  On-going    Target Date  07/17/18      PT LONG TERM GOAL #6   Title  Patient ambulates around furniture carrying cup of water with LRAD & prosthesis modified independent for household mobility.     Time  12    Period  Weeks    Status  On-going    Target Date  07/17/18            Plan - 05/06/18 1247    Clinical Impression Statement  Pt has been practising gait at home with RW and has had no falls.  Reviewed gait technique with ramp and curb and mechanics on level surface.  Worked on increasing activity tolerance with gait; pt fatigues quickly.    Rehab Potential  Good    PT Frequency  2x / week    PT Duration  12 weeks    PT Treatment/Interventions  ADLs/Self Care Home  Management;DME Instruction;Gait training;Stair training;Canalith Repostioning;Functional mobility training;Therapeutic activities;Therapeutic exercise;Balance training;Neuromuscular re-education;Patient/family education;Prosthetic Training;Vestibular    PT Next Visit Plan  review prosthetic care, HEP at sink, prosthetic gait with RW, stairs, ramps & curbs    Consulted and Agree with Plan of Care  Patient;Family member/caregiver    Family Member Consulted  wife, Benjamine Mola       Patient will benefit from skilled therapeutic intervention in order to improve the following deficits and impairments:  Abnormal gait, Decreased activity tolerance, Decreased balance, Decreased endurance, Decreased knowledge of use of DME, Decreased mobility, Impaired flexibility, Decreased strength, Postural dysfunction, Prosthetic Dependency  Visit Diagnosis: Muscle weakness (generalized)  Unsteadiness on feet  Other abnormalities of gait and mobility  Abnormal posture     Problem List Patient Active Problem List   Diagnosis Date Noted  . Pain of right lower extremity   . Type 2 diabetes mellitus with peripheral neuropathy (HCC)   . Benign essential HTN   . Coronary artery disease  involving coronary bypass graft of native heart without angina pectoris   . Chronic combined systolic and diastolic congestive heart failure ([place])   . Leukocytosis   . Transaminitis   . Unilateral AKA, right ([place])   . Cardiac arrest ([place])   . Bradycardia 12/06/2017  . History of atherosclerotic cardiovascular disease 12/06/2017  . Diabetic foot infection ([place]) 11/28/2017  . Acute CHF (congestive heart failure) ([place]) 11/28/2017  . Rectal bleeding 10/04/2013  . Heme positive stool 10/04/2013  . Community acquired pneumonia 07/30/2013  . DM type 2 (diabetes mellitus, type 2) ([place]) 07/30/2013  . HTN (hypertension), benign 07/30/2013  . CAROTID BRUITS, BILATERAL 07/16/2010  . MITRAL VALVE PROLAPSE 08/22/2009  . DM 07/22/2009   . Atherosclerotic cardiovascular disease 07/22/2009  . 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 ERECTILE DYSFUNCTION 05/27/2008  . INGUINAL HERNIA, RIGHT 10/19/2007  . ANEMIA, NORMOCYTIC 10/09/2007  . Dyslipidemia 12/19/2006  . PERIPHERAL NEUROPATHY 12/19/2006  . Unspecified glaucoma 12/19/2006  . Essential hypertension 12/19/2006  . MYOCARDIAL INFARCTION, HX OF 12/19/2006  . PERIPHERAL VASCULAR DISEASE 12/19/2006   Bjorn Loser, PTA  05/06/18, 12:50 PM [facility] [address] [place], [place], [address] Phone: [phone]   Fax:  [phone]  Name: NALIN MAZZOCCO MRN: [id number] Date of Birth: [birth year]

## 2018-05-13 ENCOUNTER — Ambulatory Visit: Payer: Medicare Other | Admitting: Physical Therapy

## 2018-05-15 ENCOUNTER — Encounter: Payer: Medicare Other | Admitting: Physical Therapy

## 2018-05-18 ENCOUNTER — Ambulatory Visit: Payer: Medicare Other | Attending: Surgery | Admitting: Physical Therapy

## 2018-05-18 ENCOUNTER — Encounter: Payer: Self-pay | Admitting: Physical Therapy

## 2018-05-18 DIAGNOSIS — R293 Abnormal posture: Secondary | ICD-10-CM | POA: Diagnosis present

## 2018-05-18 DIAGNOSIS — M6281 Muscle weakness (generalized): Secondary | ICD-10-CM | POA: Diagnosis present

## 2018-05-18 DIAGNOSIS — R2689 Other abnormalities of gait and mobility: Secondary | ICD-10-CM | POA: Insufficient documentation

## 2018-05-18 DIAGNOSIS — R2681 Unsteadiness on feet: Secondary | ICD-10-CM | POA: Insufficient documentation

## 2018-05-18 NOTE — Therapy (Signed)
[facility] [address] [place], [place], [address] Phone: [phone]   Fax:  [phone]  Physical Therapy Treatment  Patient Details  Name: JOMARI BARTNIK MRN: [id number] Date of Birth: Dec 05, 1941 Referring Provider: Harold Barban, MD   Encounter Date: 05/18/2018  PT End of Session - 05/18/18 2249    Visit Number  5    Number of Visits  25    Date for PT Re-Evaluation  07/20/18    Authorization Type  BCBS Medicare    PT Start Time  1400    PT Stop Time  1445    PT Time Calculation (min)  45 min    Equipment Utilized During Treatment  Gait belt    Activity Tolerance  Patient tolerated treatment well    Behavior During Therapy  [facility] for tasks assessed/performed       Past Medical History:  Diagnosis Date  . Cerebrovascular disease   . Diabetes mellitus   . Diabetes mellitus ([place])   . ED (erectile dysfunction)   . Glaucoma, left eye   . History of atherosclerotic cardiovascular disease   . History of myocardial infarction   . Hyperlipidemia   . Hypertension   . Inguinal hernia    Right  . Normocytic anemia   . Peripheral neuropathy   . PVD (peripheral vascular disease) ([place])   . Tubulovillous adenoma of colon     Past Surgical History:  Procedure Laterality Date  . AMPUTATION Right 12/12/2017   Procedure: AMPUTATION ABOVE KNEE RIGHT;  Surgeon: Serafina Mitchell, MD;  Location: [facility];  Service: Vascular;  Laterality: Right;  . CHOLECYSTECTOMY    . COLONOSCOPY [name] 07/22/2014   Procedure: COLONOSCOPY;  Surgeon: [name], MD;  [facility];  Service: Endoscopy;  Laterality: [name];  1235-moved to [place] notified pt  . CORONARY ARTERY BYPASS GRAFT  2002   x6  . INGUINAL HERNIA REPAIR  2004   Left  . LOWER EXTREMITY ANGIOGRAPHY [name] 12/08/2017   Procedure: LOWER EXTREMITY ANGIOGRAPHY;  Surgeon: Elam Dutch, MD;  Location: [facility];  Service: Cardiovascular;  Laterality:  [name];  . PERIPHERAL VASCULAR INTERVENTION Bilateral 12/08/2017   Procedure: PERIPHERAL VASCULAR INTERVENTION;  Surgeon: Elam Dutch, MD;  Location: [facility];  Service: Cardiovascular;  Laterality: Bilateral;  [place]    There were no vitals filed for this visit.  Subjective Assessment - 05/18/18 1400    Subjective  He wears prosthesis from 9-12 and 1-4 pm no issues. He is awake from ~8am to 9pm.  No falls.    Patient is accompained by:  Family member    Pertinent History  R TFA, CAD, cardiac arrest, CABG x2, DM, HTN, peripheral neuropathy, bradycardia, glaucoma, PVD     Limitations  Lifting;Standing;Walking;House hold activities    Patient Stated Goals  To walk with prosthesis around house, yard & store, fishing    Currently in Pain?  No/denies                       [facility] Adult PT Treatment/Exercise - 05/18/18 1400      Transfers   Transfers  Sit to Stand;Stand to Sit    Sit to Stand  5: Supervision;With upper extremity assist;With armrests;From chair/3-in-1 to RW    Stand to Sit  5: Supervision;With upper extremity assist;With armrests;To chair/3-in-1 from RW      Ambulation/Gait   Ambulation/Gait  Yes  Ambulation/Gait Assistance  5: Supervision    Ambulation/Gait Assistance Details  verbal cues on technique on grass, gravel & mulch     Ambulation Distance (Feet)  150 Feet 150' X 2, 50'    Assistive device  Rolling walker;Prosthesis    Gait Pattern  Step-to pattern;Decreased step length - left;Decreased stance time - right;Decreased stride length;Decreased hip/knee flexion - right;Decreased weight shift to right;Right hip hike;Right circumduction;Right flexed knee in stance;Antalgic;Lateral hip instability;Decreased trunk rotation;Trunk flexed;Abducted- right;Poor foot clearance - right    Ambulation Surface  Level;Indoor;Outdoor;Paved;Gravel;Grass;Other (comment) mulch    Ramp  4: Min assist;5: Supervision RW & prosthesis    Ramp Details (indicate cue  type and reason)  verbal cues on RW position, posture & wt shift    Curb  4: Min assist;5: Supervision RW & prosthesis    Curb Details (indicate cue type and reason)  verbal cues on technique      Prosthetics   Prosthetic Care Comments   Increase wear to 5hrs 2x/day. increase activity level.     Current prosthetic wear tolerance (days/week)   daily    Current prosthetic wear tolerance (#hours/day)   3-4 hrs 2x/day    Residual limb condition   skin in good condition per pt    Education Provided  Proper Donning;Proper wear schedule/adjustment reviewed tightening strap    Person(s) Educated  Patient    Education Method  Explanation;Verbal cues    Education Method  Verbalized understanding;Needs further instruction;Verbal cues required             PT Education - 05/18/18 1430    Education provided  Yes    Education Details  Increase activity level with entering/exiting home via RW prosthetic gait & initiate community mobility with family or friend supervision & follwoing with w/c for rest as needed.     Person(s) Educated  Patient    Methods  Explanation;Verbal cues    Comprehension  Verbalized understanding;Verbal cues required;Need further instruction       PT Short Term Goals - 05/18/18 2250      PT SHORT TERM GOAL #1   Title  Patient donnes prosthesis properly with RW support modified independent. (All STGs Target Date: 05/21/2018)    Baseline  MET 05/18/2018    Time  4    Status  Achieved      PT SHORT TERM GOAL #2   Title  Patient tolerates prosthesis wear >8 hrs total/day without skin issues or limb pain.     Time  4    Period  Weeks    Status  On-going    Target Date  05/21/18      PT SHORT TERM GOAL #3   Title  Patient perform standing balance with RW support & prosthesis reaching 10" anteriorly, to floor & manages clothes safely modified independent.     Baseline  MET 05/18/2018    Time  4    Period  Weeks    Status  Achieved      PT SHORT TERM GOAL #4   Title   Patient ambulates 200' with RW & prosthesis with supervision.     Time  4    Period  Weeks    Status  On-going      PT SHORT TERM GOAL #5   Title  Patient negotiates stairs with 2 rails, ramps & curbs with RW & prosthesis with minA.     Baseline  MET 05/18/2018    Time  4  Period  Weeks    Status  Achieved        PT Long Term Goals - 04/28/18 1816      PT LONG TERM GOAL #1   Title  Patient verbalizes & demonstrates proper prosthetic care to enable safe use of prosthesis. (All LTGs Target Date: 07/17/2018)    Time  12    Period  Weeks    Status  On-going    Target Date  07/17/18      PT LONG TERM GOAL #2   Title  Patient tolerates prosthesis wear >80% of awake hours without skin issues or limb pain to enable function throughout his day.     Time  12    Period  Weeks    Status  On-going    Target Date  07/17/18      PT LONG TERM GOAL #3   Title  Berg Balance >25/56 to reduce fall risk.     Time  12    Period  Weeks    Status  On-going    Target Date  07/17/18      PT LONG TERM GOAL #4   Title  Patient ambulates 500' with LRAD & Prosthesis outdoors including grass modified independent for community mobility.     Time  12    Period  Weeks    Status  Revised    Target Date  07/17/18      PT LONG TERM GOAL #5   Title  Patient negotiates ramps, curbs & stairs with LRAD & prosthesis modified independent to enable community access.     Time  12    Period  Weeks    Status  On-going    Target Date  07/17/18      PT LONG TERM GOAL #6   Title  Patient ambulates around furniture carrying cup of water with LRAD & prosthesis modified independent for household mobility.     Time  12    Period  Weeks    Status  On-going    Target Date  07/17/18            Plan - 05/18/18 2252    Clinical Impression Statement  Patient is improving prosthetic gait with RW. He appears to understand PT instructions to increase activity level with basic community moblity with family /  friends supervision.     Rehab Potential  Good    PT Frequency  2x / week    PT Duration  12 weeks    PT Treatment/Interventions  ADLs/Self Care Home Management;DME Instruction;Gait training;Stair training;Canalith Repostioning;Functional mobility training;Therapeutic activities;Therapeutic exercise;Balance training;Neuromuscular re-education;Patient/family education;Prosthetic Training;Vestibular    PT Next Visit Plan  check remaining STGs, prosthetic gait with RW, stairs, ramps & curbs    Consulted and Agree with Plan of Care  Patient;Family member/caregiver    Family Member Consulted  friend, Cabin [employer]       Patient will benefit from skilled therapeutic intervention in order to improve the following deficits and impairments:  Abnormal gait, Decreased activity tolerance, Decreased balance, Decreased endurance, Decreased knowledge of use of DME, Decreased mobility, Impaired flexibility, Decreased strength, Postural dysfunction, Prosthetic Dependency  Visit Diagnosis: Muscle weakness (generalized)  Unsteadiness on feet  Other abnormalities of gait and mobility  Abnormal posture     Problem List Patient Active Problem List   Diagnosis Date Noted  . Pain of right lower extremity   . Type 2 diabetes mellitus with peripheral neuropathy (HCC)   . Benign essential HTN   .  Coronary artery disease involving coronary bypass graft of native heart without angina pectoris   . Chronic combined systolic and diastolic congestive heart failure ([place])   . Leukocytosis   . Transaminitis   . Unilateral AKA, right ([place])   . Cardiac arrest ([place])   . Bradycardia 12/06/2017  . History of atherosclerotic cardiovascular disease 12/06/2017  . Diabetic foot infection ([place]) 11/28/2017  . Acute CHF (congestive heart failure) ([place]) 11/28/2017  . Rectal bleeding 10/04/2013  . Heme positive stool 10/04/2013  . Community acquired pneumonia 07/30/2013  . DM type 2 (diabetes mellitus, type 2) ([place]) 07/30/2013   . HTN (hypertension), benign 07/30/2013  . CAROTID BRUITS, BILATERAL 07/16/2010  . MITRAL VALVE PROLAPSE 08/22/2009  . DM 07/22/2009  . Atherosclerotic cardiovascular disease 07/22/2009  . 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 ERECTILE DYSFUNCTION 05/27/2008  . INGUINAL HERNIA, RIGHT 10/19/2007  . ANEMIA, NORMOCYTIC 10/09/2007  . Dyslipidemia 12/19/2006  . PERIPHERAL NEUROPATHY 12/19/2006  . Unspecified glaucoma 12/19/2006  . Essential hypertension 12/19/2006  . MYOCARDIAL INFARCTION, HX OF 12/19/2006  . PERIPHERAL VASCULAR DISEASE 12/19/2006    Jamey Reas PT, DPT 05/18/2018, 11:01 PM  [facility] [address] [place], [place], [address] Phone: [phone]   Fax:  [phone]  Name: CASIMER RUSSETT MRN: [id number] Date of Birth: Dec 12, 1941

## 2018-05-21 ENCOUNTER — Encounter: Payer: Self-pay | Admitting: Physical Therapy

## 2018-05-21 ENCOUNTER — Ambulatory Visit: Payer: Medicare Other | Admitting: Physical Therapy

## 2018-05-21 DIAGNOSIS — R2689 Other abnormalities of gait and mobility: Secondary | ICD-10-CM

## 2018-05-21 DIAGNOSIS — M6281 Muscle weakness (generalized): Secondary | ICD-10-CM

## 2018-05-21 DIAGNOSIS — R2681 Unsteadiness on feet: Secondary | ICD-10-CM

## 2018-05-22 NOTE — Therapy (Signed)
[facility] [address] [place], [place], [address] Phone: [phone]   Fax:  [phone]  Physical Therapy Treatment  Patient Details  Name: Marcus Hendrix MRN: [id number] Date of Birth: [birth year] Referring Provider: Harold Barban, MD   Encounter Date: 05/21/2018  PT End of Session - 05/21/18 1406    Visit Number  6    Number of Visits  25    Date for PT Re-Evaluation  07/20/18    Authorization Type  BCBS Medicare    PT Start Time  4970    PT Stop Time  1445    PT Time Calculation (min)  41 min    Equipment Utilized During Treatment  Gait belt    Activity Tolerance  Patient tolerated treatment well    Behavior During Therapy  [facility] for tasks assessed/performed       Past Medical History:  Diagnosis Date  . Cerebrovascular disease   . Diabetes mellitus   . Diabetes mellitus ([place])   . ED (erectile dysfunction)   . Glaucoma, left eye   . History of atherosclerotic cardiovascular disease   . History of myocardial infarction   . Hyperlipidemia   . Hypertension   . Inguinal hernia    Right  . Normocytic anemia   . Peripheral neuropathy   . PVD (peripheral vascular disease) ([place])   . Tubulovillous adenoma of colon     Past Surgical History:  Procedure Laterality Date  . AMPUTATION Right 12/12/2017   Procedure: AMPUTATION ABOVE KNEE RIGHT;  Surgeon: Serafina Mitchell, MD;  Location: [facility];  Service: Vascular;  Laterality: Right;  . CHOLECYSTECTOMY    . COLONOSCOPY [name] 07/22/2014   Procedure: COLONOSCOPY;  Surgeon: [name], MD;  [facility];  Service: Endoscopy;  Laterality: [name];  1235-moved to [place] notified pt  . CORONARY ARTERY BYPASS GRAFT  2002   x6  . INGUINAL HERNIA REPAIR  2004   Left  . LOWER EXTREMITY ANGIOGRAPHY [name] 12/08/2017   Procedure: LOWER EXTREMITY ANGIOGRAPHY;  Surgeon: Elam Dutch, MD;  Location: [facility] [facility];  Service: Cardiovascular;  Laterality:  [name];  . PERIPHERAL VASCULAR INTERVENTION Bilateral 12/08/2017   Procedure: PERIPHERAL VASCULAR INTERVENTION;  Surgeon: Elam Dutch, MD;  Location: [place] [facility];  Service: Cardiovascular;  Laterality: Bilateral;  [place]    There were no vitals filed for this visit.  Subjective Assessment - 05/21/18 1406    Subjective  No new complaints. No falls or pain to report.     Patient is accompained by:  Family member    Pertinent History  R TFA, CAD, cardiac arrest, CABG x2, DM, HTN, peripheral neuropathy, bradycardia, glaucoma, PVD     Limitations  Lifting;Standing;Walking;House hold activities    Patient Stated Goals  To walk with prosthesis around house, yard & store, fishing    Currently in Pain?  No/denies           [facility] Adult PT Treatment/Exercise - 05/21/18 1408      Transfers   Transfers  Sit to Stand;Stand to Sit    Sit to Stand  5: Supervision;With upper extremity assist;With armrests;From chair/3-in-1    Sit to Stand Details (indicate cue type and reason)  needed RW to stabilize on once standing    Stand to Sit  5: Supervision;With upper extremity assist;With armrests;To chair/3-in-1    Stand to Sit Details  use of RW for stability with sitting as well  Ambulation/Gait   Ambulation/Gait  Yes    Ambulation/Gait Assistance  5: Supervision    Ambulation Distance (Feet)  220 Feet x1    Assistive device  Rolling walker;Prosthesis    Gait Pattern  Step-through pattern;Decreased stride length;Decreased stance time - right;Decreased step length - left;Narrow base of support;Right circumduction    Ambulation Surface  Level;Indoor    Stairs  Yes    Stairs Assistance  4: Min guard    Stair Management Technique  Two rails;Step to pattern;Forwards    Number of Stairs  4    Ramp  4: Min assist;Other (comment) min guard assist    Ramp Details (indicate cue type and reason)  with RW/prosthesis; min guard assist to ascend ramp/min assist to descend ramp, cues on posture and  sequencing    Curb  Other (comment);4: Min assist min guard assist     Curb Details (indicate cue type and reason)  min guard assist for balance to descend/ascend with RW/prosthesis, min assist with foot of prosthesis caught on curb with ascending.       High Level Balance   High Level Balance Activities  Negotiating over obstacles    High Level Balance Comments  with RW/prosthesis fwd stepping over bolsters of varied heights with min guard to min assist      Prosthetics   Current prosthetic wear tolerance (days/week)   daily    Current prosthetic wear tolerance (#hours/day)   6-8 hours a day               PT Short Term Goals - 05/21/18 1407      PT SHORT TERM GOAL #1   Title  Patient donnes prosthesis properly with RW support modified independent. (All STGs Target Date: 05/21/2018)    Baseline  MET 05/18/2018    Status  Achieved      PT SHORT TERM GOAL #2   Title  Patient tolerates prosthesis wear >8 hrs total/day without skin issues or limb pain.     Baseline  05/21/18; wearing 6-8 hours a day with no issues    Status  Partially Met      PT SHORT TERM GOAL #3   Title  Patient perform standing balance with RW support & prosthesis reaching 10" anteriorly, to floor & manages clothes safely modified independent.     Baseline  MET 05/18/2018    Status  Achieved      PT SHORT TERM GOAL #4   Title  Patient ambulates 200' with RW & prosthesis with supervision.     Baseline  05/21/18: met today    Status  Achieved      PT SHORT TERM GOAL #5   Title  Patient negotiates stairs with 2 rails, ramps & curbs with RW & prosthesis with minA.     Baseline  MET 05/18/2018    Status  Achieved        PT Long Term Goals - 04/28/18 1816      PT LONG TERM GOAL #1   Title  Patient verbalizes & demonstrates proper prosthetic care to enable safe use of prosthesis. (All LTGs Target Date: 07/17/2018)    Time  12    Period  Weeks    Status  On-going    Target Date  07/17/18      PT LONG TERM GOAL  #2   Title  Patient tolerates prosthesis wear >80% of awake hours without skin issues or limb pain to enable function throughout his day.  Time  12    Period  Weeks    Status  On-going    Target Date  07/17/18      PT LONG TERM GOAL #3   Title  Berg Balance >25/56 to reduce fall risk.     Time  12    Period  Weeks    Status  On-going    Target Date  07/17/18      PT LONG TERM GOAL #4   Title  Patient ambulates 500' with LRAD & Prosthesis outdoors including grass modified independent for community mobility.     Time  12    Period  Weeks    Status  Revised    Target Date  07/17/18      PT LONG TERM GOAL #5   Title  Patient negotiates ramps, curbs & stairs with LRAD & prosthesis modified independent to enable community access.     Time  12    Period  Weeks    Status  On-going    Target Date  07/17/18      PT LONG TERM GOAL #6   Title  Patient ambulates around furniture carrying cup of water with LRAD & prosthesis modified independent for household mobility.     Time  12    Period  Weeks    Status  On-going    Target Date  07/17/18            Plan - 05/21/18 1406    Clinical Impression Statement  Today's skilled session continued to address gait/barriers with RW/prosthesis with pt meeting his remaining STG today. Pt is making steady progress toward goals and should benefit from continued PT to progress toward unmet goals.     Rehab Potential  Good    PT Frequency  2x / week    PT Duration  12 weeks    PT Treatment/Interventions  ADLs/Self Care Home Management;DME Instruction;Gait training;Stair training;Canalith Repostioning;Functional mobility training;Therapeutic activities;Therapeutic exercise;Balance training;Neuromuscular re-education;Patient/family education;Prosthetic Training;Vestibular    PT Next Visit Plan  prosthetic gait with RW, stairs, ramps & curbs    Consulted and Agree with Plan of Care  Patient;Family member/caregiver    Family Member Consulted   spouse      Patient will benefit from skilled therapeutic intervention in order to improve the following deficits and impairments:  Abnormal gait, Decreased activity tolerance, Decreased balance, Decreased endurance, Decreased knowledge of use of DME, Decreased mobility, Impaired flexibility, Decreased strength, Postural dysfunction, Prosthetic Dependency  Visit Diagnosis: Muscle weakness (generalized)  Unsteadiness on feet  Other abnormalities of gait and mobility     Problem List Patient Active Problem List   Diagnosis Date Noted  . Pain of right lower extremity   . Type 2 diabetes mellitus with peripheral neuropathy (HCC)   . Benign essential HTN   . Coronary artery disease involving coronary bypass graft of native heart without angina pectoris   . Chronic combined systolic and diastolic congestive heart failure ([place])   . Leukocytosis   . Transaminitis   . Unilateral AKA, right ([place])   . Cardiac arrest ([place])   . Bradycardia 12/06/2017  . History of atherosclerotic cardiovascular disease 12/06/2017  . Diabetic foot infection ([place]) 11/28/2017  . Acute CHF (congestive heart failure) ([place]) 11/28/2017  . Rectal bleeding 10/04/2013  . Heme positive stool 10/04/2013  . Community acquired pneumonia 07/30/2013  . DM type 2 (diabetes mellitus, type 2) ([place]) 07/30/2013  . HTN (hypertension), benign 07/30/2013  . CAROTID BRUITS, BILATERAL 07/16/2010  .  MITRAL VALVE PROLAPSE 08/22/2009  . DM 07/22/2009  . Atherosclerotic cardiovascular disease 07/22/2009  . 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 ERECTILE DYSFUNCTION 05/27/2008  . INGUINAL HERNIA, RIGHT 10/19/2007  . ANEMIA, NORMOCYTIC 10/09/2007  . Dyslipidemia 12/19/2006  . PERIPHERAL NEUROPATHY 12/19/2006  . Unspecified glaucoma 12/19/2006  . Essential hypertension 12/19/2006  . MYOCARDIAL INFARCTION, HX OF 12/19/2006  . PERIPHERAL VASCULAR DISEASE 12/19/2006    Willow Ora, PTA, [facility] [address] [place], [place] [address] [phone] 05/22/18, 4:50 PM   Name: Marcus Hendrix MRN: [id number] Date of Birth: 10/12/1941

## 2018-05-25 ENCOUNTER — Ambulatory Visit: Payer: Medicare Other | Admitting: Physical Therapy

## 2018-05-25 ENCOUNTER — Encounter: Payer: Self-pay | Admitting: Physical Therapy

## 2018-05-25 DIAGNOSIS — M6281 Muscle weakness (generalized): Secondary | ICD-10-CM

## 2018-05-25 DIAGNOSIS — R293 Abnormal posture: Secondary | ICD-10-CM

## 2018-05-25 DIAGNOSIS — R2681 Unsteadiness on feet: Secondary | ICD-10-CM

## 2018-05-25 DIAGNOSIS — R2689 Other abnormalities of gait and mobility: Secondary | ICD-10-CM

## 2018-05-25 NOTE — Therapy (Signed)
[facility] [address] [place], [place], [address] Phone: [phone]   Fax:  [phone]  Physical Therapy Treatment  Patient Details  Name: Marcus Hendrix MRN: [id number] Date of Birth: 1941/09/27 Referring Provider: Harold Barban, MD   Encounter Date: 05/25/2018  PT End of Session - 05/25/18 1021    Visit Number  7    Number of Visits  25    Date for PT Re-Evaluation  07/20/18    Authorization Type  BCBS Medicare    PT Start Time  1018    PT Stop Time  1100    PT Time Calculation (min)  42 min    Equipment Utilized During Treatment  Gait belt    Activity Tolerance  Patient tolerated treatment well;Patient limited by pain    Behavior During Therapy  [facility] for tasks assessed/performed       Past Medical History:  Diagnosis Date  . Cerebrovascular disease   . Diabetes mellitus   . Diabetes mellitus ([place])   . ED (erectile dysfunction)   . Glaucoma, left eye   . History of atherosclerotic cardiovascular disease   . History of myocardial infarction   . Hyperlipidemia   . Hypertension   . Inguinal hernia    Right  . Normocytic anemia   . Peripheral neuropathy   . PVD (peripheral vascular disease) ([place])   . Tubulovillous adenoma of colon     Past Surgical History:  Procedure Laterality Date  . AMPUTATION Right 12/12/2017   Procedure: AMPUTATION ABOVE KNEE RIGHT;  Surgeon: Serafina Mitchell, MD;  Location: [facility];  Service: Vascular;  Laterality: Right;  . CHOLECYSTECTOMY    . COLONOSCOPY [name] 07/22/2014   Procedure: COLONOSCOPY;  Surgeon: [name], MD;  [facility];  Service: Endoscopy;  Laterality: [name];  1235-moved to [place] notified pt  . CORONARY ARTERY BYPASS GRAFT  2002   x6  . INGUINAL HERNIA REPAIR  2004   Left  . LOWER EXTREMITY ANGIOGRAPHY [name] 12/08/2017   Procedure: LOWER EXTREMITY ANGIOGRAPHY;  Surgeon: Elam Dutch, MD;  Location: [place] [facility];  Service:  Cardiovascular;  Laterality: [name];  . PERIPHERAL VASCULAR INTERVENTION Bilateral 12/08/2017   Procedure: PERIPHERAL VASCULAR INTERVENTION;  Surgeon: Elam Dutch, MD;  Location: [place] [facility];  Service: Cardiovascular;  Laterality: Bilateral;  [place]    There were no vitals filed for this visit.  Subjective Assessment - 05/25/18 1020    Subjective  No falls or pain to report. Does report having a "crick in my neck" from sleeping wrong last night. Discussed use of heat to help with this at home.     Patient is accompained by:  Family member spouse, Marcus Hendrix    Pertinent History  R TFA, CAD, cardiac arrest, CABG x2, DM, HTN, peripheral neuropathy, bradycardia, glaucoma, PVD     Limitations  Lifting;Standing;Walking;House hold activities    Patient Stated Goals  To walk with prosthesis around house, yard & store, fishing    Currently in Pain?  No/denies          [facility] Adult PT Treatment/Exercise - 05/25/18 1022      Transfers   Transfers  Sit to Stand;Stand to Sit    Sit to Stand  5: Supervision;With upper extremity assist;With armrests;From chair/3-in-1    Sit to Stand Details (indicate cue type and reason)  continues to need RW to stabilize on once standing    Stand to Sit  5:  Supervision;With upper extremity assist;With armrests;To chair/3-in-1    Stand to Sit Details  uses RW for stability when sitting.      Ambulation/Gait   Ambulation/Gait  Yes    Ambulation/Gait Assistance  4: Min guard;5: Supervision    Ambulation/Gait Assistance Details  cues on weight shifting and step length. cues to look up/forward to scan ahead with gait (vs at feet/floor)     Ambulation Distance (Feet)  220 Feet x1, plus around gym with barriers/Scifit    Assistive device  Rolling walker;Prosthesis    Gait Pattern  Step-through pattern;Decreased stride length;Decreased stance time - right;Decreased step length - left;Narrow base of support;Right circumduction    Ambulation Surface  Level;Indoor     Ramp  Other (comment) min guard assist     Ramp Details (indicate cue type and reason)  with RW/prosthesis x 1 with cues on posture, step length and sequencing    Curb  Other (comment) min guard assist    Curb Details (indicate cue type and reason)  min guard assist x 1 on indoor 6 inch curb with cues on stance position, reminder cues for sequencing                Exercises   Exercises  Other Exercises    Other Exercises   seated in wheelchair: cervical retraction for 5 sec's x 10 reps, then scapular retraction for 5 sec holds x 10 reps. verbal and tactile cues needed on correct ex form/technique.       Knee/Hip Exercises: Aerobic   Other Aerobic  Scifit with UE/LE's level 1.5 x  minutes with goal >/= 50 RPM for strengthening.       Moist Heat Therapy   Number Minutes Moist Heat  -- concurrent with seated ex's and Scifit    Moist Heat Location  Shoulder left shoulder      Prosthetics   Current prosthetic wear tolerance (days/week)   daily    Current prosthetic wear tolerance (#hours/day)   6-8 hours a day    Residual limb condition   intact with no issues per pt    Education Provided  Residual limb care;Proper wear schedule/adjustment;Proper weight-bearing schedule/adjustment    Person(s) Educated  Patient;Spouse    Education Method  Explanation;Demonstration;Verbal cues    Education Method  Verbalized understanding;Verbal cues required;Needs further [place] Prosthesis  Supervision          PT Short Term Goals - 05/21/18 1407      PT SHORT TERM GOAL #1   Title  Patient donnes prosthesis properly with RW support modified independent. (All STGs Target Date: 05/21/2018)    Baseline  MET 05/18/2018    Status  Achieved      PT SHORT TERM GOAL #2   Title  Patient tolerates prosthesis wear >8 hrs total/day without skin issues or limb pain.     Baseline  05/21/18; wearing 6-8 hours a day with no issues    Status  Partially Met      PT SHORT TERM GOAL #3   Title   Patient perform standing balance with RW support & prosthesis reaching 10" anteriorly, to floor & manages clothes safely modified independent.     Baseline  MET 05/18/2018    Status  Achieved      PT SHORT TERM GOAL #4   Title  Patient ambulates 200' with RW & prosthesis with supervision.     Baseline  05/21/18: met today    Status  Achieved  PT SHORT TERM GOAL #5   Title  Patient negotiates stairs with 2 rails, ramps & curbs with RW & prosthesis with minA.     Baseline  MET 05/18/2018    Status  Achieved        PT Long Term Goals - 04/28/18 1816      PT LONG TERM GOAL #1   Title  Patient verbalizes & demonstrates proper prosthetic care to enable safe use of prosthesis. (All LTGs Target Date: 07/17/2018)    Time  12    Period  Weeks    Status  On-going    Target Date  07/17/18      PT LONG TERM GOAL #2   Title  Patient tolerates prosthesis wear >80% of awake hours without skin issues or limb pain to enable function throughout his day.     Time  12    Period  Weeks    Status  On-going    Target Date  07/17/18      PT LONG TERM GOAL #3   Title  Berg Balance >25/56 to reduce fall risk.     Time  12    Period  Weeks    Status  On-going    Target Date  07/17/18      PT LONG TERM GOAL #4   Title  Patient ambulates 500' with LRAD & Prosthesis outdoors including grass modified independent for community mobility.     Time  12    Period  Weeks    Status  Revised    Target Date  07/17/18      PT LONG TERM GOAL #5   Title  Patient negotiates ramps, curbs & stairs with LRAD & prosthesis modified independent to enable community access.     Time  12    Period  Weeks    Status  On-going    Target Date  07/17/18      PT LONG TERM GOAL #6   Title  Patient ambulates around furniture carrying cup of water with LRAD & prosthesis modified independent for household mobility.     Time  12    Period  Weeks    Status  On-going    Target Date  07/17/18            Plan - 05/25/18  1021    Clinical Impression Statement  Today's skilled session continued to focus on gait and barriers with RW/prosthesis. Limited by left shoulder pain with weight bearing on RW. Discussed with primary PT who okayed use of moist heat to relax shoulder muscles concurrent with ex's to address tighteness and stregthening. Pt reported decreased pain to 4/10 after session. Pt is progress toward goals and should benefit from continued PT to progress toward unmet goals.     Rehab Potential  Good    PT Frequency  2x / week    PT Duration  12 weeks    PT Treatment/Interventions  ADLs/Self Care Home Management;DME Instruction;Gait training;Stair training;Canalith Repostioning;Functional mobility training;Therapeutic activities;Therapeutic exercise;Balance training;Neuromuscular re-education;Patient/family education;Prosthetic Training;Vestibular    PT Next Visit Plan  prosthetic gait with RW, stairs, ramps & curbs    Consulted and Agree with Plan of Care  Patient;Family member/caregiver    Family Member Consulted  friend, Cabin crew       Patient will benefit from skilled therapeutic intervention in order to improve the following deficits and impairments:  Abnormal gait, Decreased activity tolerance, Decreased balance, Decreased endurance, Decreased knowledge of use of DME, Decreased mobility,  Impaired flexibility, Decreased strength, Postural dysfunction, Prosthetic Dependency  Visit Diagnosis: Muscle weakness (generalized)  Unsteadiness on feet  Other abnormalities of gait and mobility  Abnormal posture     Problem List Patient Active Problem List   Diagnosis Date Noted  . Pain of right lower extremity   . Type 2 diabetes mellitus with peripheral neuropathy (HCC)   . Benign essential HTN   . Coronary artery disease involving coronary bypass graft of native heart without angina pectoris   . Chronic combined systolic and diastolic congestive heart failure ([place])   . Leukocytosis   .  Transaminitis   . Unilateral AKA, right ([place])   . Cardiac arrest ([place])   . Bradycardia 12/06/2017  . History of atherosclerotic cardiovascular disease 12/06/2017  . Diabetic foot infection ([place]) 11/28/2017  . Acute CHF (congestive heart failure) ([place]) 11/28/2017  . Rectal bleeding 10/04/2013  . Heme positive stool 10/04/2013  . Community acquired pneumonia 07/30/2013  . DM type 2 (diabetes mellitus, type 2) ([place]) 07/30/2013  . HTN (hypertension), benign 07/30/2013  . CAROTID BRUITS, BILATERAL 07/16/2010  . MITRAL VALVE PROLAPSE 08/22/2009  . DM 07/22/2009  . Atherosclerotic cardiovascular disease 07/22/2009  . 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 ERECTILE DYSFUNCTION 05/27/2008  . INGUINAL HERNIA, RIGHT 10/19/2007  . ANEMIA, NORMOCYTIC 10/09/2007  . Dyslipidemia 12/19/2006  . PERIPHERAL NEUROPATHY 12/19/2006  . Unspecified glaucoma 12/19/2006  . Essential hypertension 12/19/2006  . MYOCARDIAL INFARCTION, HX OF 12/19/2006  . PERIPHERAL VASCULAR DISEASE 12/19/2006    Willow Ora, PTA, [facility] [address] [place], [place] [address] [phone] 05/25/18, 1:11 PM   Name: Marcus Hendrix MRN: [id number] Date of Birth: February 02, 1941

## 2018-05-28 ENCOUNTER — Encounter: Payer: Self-pay | Admitting: Physical Therapy

## 2018-05-28 ENCOUNTER — Ambulatory Visit: Payer: Medicare Other | Admitting: Physical Therapy

## 2018-05-28 DIAGNOSIS — R2681 Unsteadiness on feet: Secondary | ICD-10-CM

## 2018-05-28 DIAGNOSIS — M6281 Muscle weakness (generalized): Secondary | ICD-10-CM | POA: Diagnosis not present

## 2018-05-28 DIAGNOSIS — R2689 Other abnormalities of gait and mobility: Secondary | ICD-10-CM

## 2018-05-28 DIAGNOSIS — R293 Abnormal posture: Secondary | ICD-10-CM

## 2018-05-28 NOTE — Therapy (Signed)
[facility] [address] [place], [place], [address] Phone: [phone]   Fax:  [phone]  Physical Therapy Treatment  Patient Details  Name: Marcus Hendrix MRN: [id number] Date of Birth: 1941-12-15 Referring Provider: Harold Barban, MD   Encounter Date: 05/28/2018  PT End of Session - 05/28/18 1419    Visit Number  8    Number of Visits  25    Date for PT Re-Evaluation  07/20/18    Authorization Type  BCBS Medicare    PT Start Time  1314    PT Stop Time  1405    PT Time Calculation (min)  51 min    Equipment Utilized During Treatment  Gait belt    Activity Tolerance  Patient tolerated treatment well;Patient limited by pain    Behavior During Therapy  [facility] for tasks assessed/performed       Past Medical History:  Diagnosis Date  . Cerebrovascular disease   . Diabetes mellitus   . Diabetes mellitus ([place])   . ED (erectile dysfunction)   . Glaucoma, left eye   . History of atherosclerotic cardiovascular disease   . History of myocardial infarction   . Hyperlipidemia   . Hypertension   . Inguinal hernia    Right  . Normocytic anemia   . Peripheral neuropathy   . PVD (peripheral vascular disease) ([place])   . Tubulovillous adenoma of colon     Past Surgical History:  Procedure Laterality Date  . AMPUTATION Right 12/12/2017   Procedure: AMPUTATION ABOVE KNEE RIGHT;  Surgeon: Serafina Mitchell, MD;  Location: [facility];  Service: Vascular;  Laterality: Right;  . CHOLECYSTECTOMY    . COLONOSCOPY [name] 07/22/2014   Procedure: COLONOSCOPY;  Surgeon: [name], MD;  [facility];  Service: Endoscopy;  Laterality: [name];  1235-moved to [place] notified pt  . CORONARY ARTERY BYPASS GRAFT  2002   x6  . INGUINAL HERNIA REPAIR  2004   Left  . LOWER EXTREMITY ANGIOGRAPHY [name] 12/08/2017   Procedure: LOWER EXTREMITY ANGIOGRAPHY;  Surgeon: Elam Dutch, MD;  Location: [place] [facility];  Service:  Cardiovascular;  Laterality: [name];  . PERIPHERAL VASCULAR INTERVENTION Bilateral 12/08/2017   Procedure: PERIPHERAL VASCULAR INTERVENTION;  Surgeon: Elam Dutch, MD;  Location: [facility];  Service: Cardiovascular;  Laterality: Bilateral;  [place]    There were no vitals filed for this visit.  Subjective Assessment - 05/28/18 1314    Subjective  He has been wearing prosthesis from bathing (~9:00) to mid-day, off ~2 hrs, then again until ~5:30. He naps midday usually on couch.     Patient is accompained by:  Family member spouse, [name]    Pertinent History  R TFA, CAD, cardiac arrest, CABG x2, DM, HTN, peripheral neuropathy, bradycardia, glaucoma, PVD     Limitations  Lifting;Standing;Walking;House hold activities    Patient Stated Goals  To walk with prosthesis around house, yard & store, fishing    Currently in Pain?  No/denies                       [facility] Adult PT Treatment/Exercise - 05/28/18 1315      Transfers   Transfers  Sit to Stand;Stand to Sit    Sit to Stand  5: Supervision;With upper extremity assist;From chair/3-in-1 chairs without armrests to RW    Stand to Sit  5: Supervision;With upper extremity assist;To chair/3-in-1 chair without armrest from RW  Ambulation/Gait   Ambulation/Gait  Yes    Ambulation/Gait Assistance  5: Supervision;3: Mod assist [place] cane/counter & supervision RW    Ambulation/Gait Assistance Details  PT instructed verbal, manual & demo cues on technique with cane LUE & counter RUE, wt shift over prosthesis in stance & upright posture.     Ambulation Distance (Feet)  150 Feet 50' X2 RW, 30' X 2 cane/counter, 150' & 80' RW outdoors    Assistive device  Rolling walker;Prosthesis    Gait Pattern  Step-through pattern;Decreased stride length;Decreased stance time - right;Decreased step length - left;Narrow base of support;Right circumduction    Ambulation Surface  Indoor;Level;Outdoor;Paved    Ramp  5: Supervision RW &  prosthesis    Curb  5: Supervision RW & prosthesis      Exercises   Exercises  --    Other Exercises   --      Knee/Hip Exercises: Aerobic   Other Aerobic  --      Moist Heat Therapy   Moist Heat Location  --      Prosthetics   Prosthetic Care Comments   Fall risk, ability to function & energy / stress on LLE without prosthesis wear.  PT recommended wear from shower (~9am) until 5:30 without break. If napping on couch, wear prosthesis.     Current prosthetic wear tolerance (days/week)   daily    Current prosthetic wear tolerance (#hours/day)   ~6hrs total /day. increase to 8hrs without break.     Residual limb condition   --    Education Provided  Residual limb care;Proper wear schedule/adjustment;Proper weight-bearing schedule/adjustment    Person(s) Educated  Patient;Spouse    Education Method  Explanation;Verbal cues    Education Method  Verbalized understanding;Needs further instruction    Donning Prosthesis  Modified independent (device/increased time)               PT Short Term Goals - 05/28/18 1453      PT SHORT TERM GOAL #1   Title  Patient reports prosthesis wear daily >8 hrs total /day without skin issues.     Status  New    Target Date  06/19/18      PT SHORT TERM GOAL #2   Title  Patient stands without UE support for 1 minute with supervision, reaches 2" & looks to side with min guard.     Status  New    Target Date  06/19/18      PT SHORT TERM GOAL #3   Title  Patient ambulates 250' with RW & prosthesis with supervision.     Status  New    Target Date  06/19/18      PT SHORT TERM GOAL #4   Title  Patient ambulates 52' using cane & counter support with minA.     Status  New    Target Date  06/19/18      PT SHORT TERM GOAL #5   Title  Patient negotiates stairs with 1 rail/cane, ramps & curbs with RW & prosthesis with supervision.      Status  New    Target Date  06/19/18        PT Long Term Goals - 05/28/18 1458      PT LONG TERM GOAL #1    Title  Patient verbalizes & demonstrates proper prosthetic care to enable safe use of prosthesis. (All LTGs Target Date: 07/17/2018)    Time  12    Period  Weeks  Status  On-going    Target Date  07/17/18      PT LONG TERM GOAL #2   Title  Patient tolerates prosthesis wear >80% of awake hours without skin issues or limb pain to enable function throughout his day.     Time  12    Period  Weeks    Status  On-going    Target Date  07/17/18      PT LONG TERM GOAL #3   Title  Berg Balance >20/56 to reduce fall risk.     Time  12    Period  Weeks    Status  Revised    Target Date  07/17/18      PT LONG TERM GOAL #4   Title  Patient ambulates 300' with LRAD & Prosthesis outdoors including grass modified independent for community mobility.     Time  12    Period  Weeks    Status  Revised    Target Date  07/17/18      PT LONG TERM GOAL #5   Title  Patient negotiates ramps, curbs & stairs with LRAD & prosthesis modified independent to enable community access.     Time  12    Period  Weeks    Status  On-going    Target Date  07/17/18      PT LONG TERM GOAL #6   Title  Patient ambulates around furniture carrying cup of water with LRAD & prosthesis modified independent for household mobility.     Time  12    Period  Weeks    Status  On-going    Target Date  07/17/18            Plan - 05/28/18 1459    Clinical Impression Statement  Patient was introduced to prosthetic gait with cane today and needs skilled instruction assist at this time to use cane. With additional instruction, he should be able to use cane with RUE support on rail or counter top to enable work at home.      Rehab Potential  Good    PT Frequency  2x / week    PT Duration  12 weeks    PT Treatment/Interventions  ADLs/Self Care Home Management;DME Instruction;Gait training;Stair training;Canalith Repostioning;Functional mobility training;Therapeutic activities;Therapeutic exercise;Balance  training;Neuromuscular re-education;Patient/family education;Prosthetic Training;Vestibular    PT Next Visit Plan  prosthetic gait with RW, stairs (1 rail & cane), ramps & curbs, gait with cane & counter support.     Consulted and Agree with Plan of Care  Patient;Family member/caregiver    Family Member Consulted  wife, Marcus Hendrix       Patient will benefit from skilled therapeutic intervention in order to improve the following deficits and impairments:  Abnormal gait, Decreased activity tolerance, Decreased balance, Decreased endurance, Decreased knowledge of use of DME, Decreased mobility, Impaired flexibility, Decreased strength, Postural dysfunction, Prosthetic Dependency  Visit Diagnosis: Muscle weakness (generalized)  Unsteadiness on feet  Other abnormalities of gait and mobility  Abnormal posture     Problem List Patient Active Problem List   Diagnosis Date Noted  . Pain of right lower extremity   . Type 2 diabetes mellitus with peripheral neuropathy (HCC)   . Benign essential HTN   . Coronary artery disease involving coronary bypass graft of native heart without angina pectoris   . Chronic combined systolic and diastolic congestive heart failure ([place])   . Leukocytosis   . Transaminitis   . Unilateral AKA, right ([place])   .  Cardiac arrest ([place])   . Bradycardia 12/06/2017  . History of atherosclerotic cardiovascular disease 12/06/2017  . Diabetic foot infection ([place]) 11/28/2017  . Acute CHF (congestive heart failure) ([place]) 11/28/2017  . Rectal bleeding 10/04/2013  . Heme positive stool 10/04/2013  . Community acquired pneumonia 07/30/2013  . DM type 2 (diabetes mellitus, type 2) ([place]) 07/30/2013  . HTN (hypertension), benign 07/30/2013  . CAROTID BRUITS, BILATERAL 07/16/2010  . MITRAL VALVE PROLAPSE 08/22/2009  . DM 07/22/2009  . Atherosclerotic cardiovascular disease 07/22/2009  . 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 ERECTILE DYSFUNCTION 05/27/2008  .  INGUINAL HERNIA, RIGHT 10/19/2007  . ANEMIA, NORMOCYTIC 10/09/2007  . Dyslipidemia 12/19/2006  . PERIPHERAL NEUROPATHY 12/19/2006  . Unspecified glaucoma 12/19/2006  . Essential hypertension 12/19/2006  . MYOCARDIAL INFARCTION, HX OF 12/19/2006  . PERIPHERAL VASCULAR DISEASE 12/19/2006    Jamey Reas PT, DPT 05/28/2018, 3:08 PM  [facility] [address] [place], [place], [address] Phone: [phone]   Fax:  [phone]  Name: Marcus Hendrix MRN: [id number] Date of Birth: 1941-04-25

## 2018-06-01 ENCOUNTER — Encounter: Payer: Self-pay | Admitting: Physical Therapy

## 2018-06-01 ENCOUNTER — Ambulatory Visit: Payer: Medicare Other | Admitting: Physical Therapy

## 2018-06-01 DIAGNOSIS — M6281 Muscle weakness (generalized): Secondary | ICD-10-CM

## 2018-06-01 DIAGNOSIS — R2689 Other abnormalities of gait and mobility: Secondary | ICD-10-CM

## 2018-06-01 DIAGNOSIS — R2681 Unsteadiness on feet: Secondary | ICD-10-CM

## 2018-06-01 DIAGNOSIS — R293 Abnormal posture: Secondary | ICD-10-CM

## 2018-06-02 NOTE — Therapy (Signed)
[facility] [address] [place], [place], [address] Phone: [phone]   Fax:  [phone]  Physical Therapy Treatment  Patient Details  Name: Marcus Hendrix MRN: [id number] Date of Birth: [birth year] Referring Provider: Harold Barban, MD   Encounter Date: 06/01/2018  PT End of Session - 06/01/18 1322    Visit Number  9    Number of Visits  25    Date for PT Re-Evaluation  07/20/18    Authorization Type  BCBS Medicare    PT Start Time  1318    PT Stop Time  1400    PT Time Calculation (min)  42 min    Equipment Utilized During Treatment  Gait belt    Activity Tolerance  Patient tolerated treatment well;Patient limited by fatigue    Behavior During Therapy  [facility] for tasks assessed/performed       Past Medical History:  Diagnosis Date  . Cerebrovascular disease   . Diabetes mellitus   . Diabetes mellitus ([place])   . ED (erectile dysfunction)   . Glaucoma, left eye   . History of atherosclerotic cardiovascular disease   . History of myocardial infarction   . Hyperlipidemia   . Hypertension   . Inguinal hernia    Right  . Normocytic anemia   . Peripheral neuropathy   . PVD (peripheral vascular disease) ([place])   . Tubulovillous adenoma of colon     Past Surgical History:  Procedure Laterality Date  . AMPUTATION Right 12/12/2017   Procedure: AMPUTATION ABOVE KNEE RIGHT;  Surgeon: Serafina Mitchell, MD;  Location: [facility];  Service: Vascular;  Laterality: Right;  . CHOLECYSTECTOMY    . COLONOSCOPY [name] 07/22/2014   Procedure: COLONOSCOPY;  Surgeon: [name], MD;  [facility];  Service: Endoscopy;  Laterality: [name];  1235-moved to [place] notified pt  . CORONARY ARTERY BYPASS GRAFT  2002   x6  . INGUINAL HERNIA REPAIR  2004   Left  . LOWER EXTREMITY ANGIOGRAPHY [name] 12/08/2017   Procedure: LOWER EXTREMITY ANGIOGRAPHY;  Surgeon: Elam Dutch, MD;  Location: [facility];  Service:  Cardiovascular;  Laterality: [name];  . PERIPHERAL VASCULAR INTERVENTION Bilateral 12/08/2017   Procedure: PERIPHERAL VASCULAR INTERVENTION;  Surgeon: Elam Dutch, MD;  Location: [facility];  Service: Cardiovascular;  Laterality: Bilateral;  [place]    There were no vitals filed for this visit.  Subjective Assessment - 06/01/18 1321    Subjective  No new complaints. No falls or pain.     Patient is accompained by:  Family member spouse, Marcus Hendrix    Pertinent History  R TFA, CAD, cardiac arrest, CABG x2, DM, HTN, peripheral neuropathy, bradycardia, glaucoma, PVD     Limitations  Lifting;Standing;Walking;House hold activities    Patient Stated Goals  To walk with prosthesis around house, yard & store, fishing    Currently in Pain?  No/denies           06/01/18 1322  Transfers  Transfers Stand to Sit;Sit to Stand  Sit to Stand 5: Supervision;With upper extremity assist;From chair/3-in-1  Sit to Stand Details (indicate cue type and reason) needs RW to stabilize on with standing  Stand to Sit 5: Supervision;With upper extremity assist;To chair/3-in-1  Stand to Sit Details uses RW for stability when reaching back to sit down  Ambulation/Gait  Ambulation/Gait Yes  Ambulation/Gait Assistance 4: Min guard;5: Supervision  Ambulation/Gait Assistance Details cues on step lenght, RW position with gait  and to look up at times with gait.   Ambulation Distance (Feet) 220 Feet (x2 reps)  Assistive device Rolling walker;Prosthesis  Gait Pattern Step-through pattern;Decreased stride length;Decreased stance time - right;Decreased step length - left;Narrow base of support;Right circumduction  Ambulation Surface Level;Indoor  Ramp Other (comment) (min guard with RW)  Ramp Details (indicate cue type and reason) cues on sequencing and to on RW position   Curb 5: Supervision  Curb Details (indicate cue type and reason) with RW- reminder cues on stance position and correct sequencing.    Prosthetics  Current prosthetic wear tolerance (days/week)  daily  Current prosthetic wear tolerance (#hours/day)  6-7 hours a day, working towards the 8 hour goal  Residual limb condition  intact with no issues per pt  Education Provided Residual limb care;Proper wear schedule/adjustment;Proper weight-bearing schedule/adjustment  Person(s) Educated Patient;Spouse  Education Method Explanation;Demonstration;Verbal cues  Education Method Verbalized understanding;Verbal cues required;Needs further instruction  Donning Prosthesis 6        PT Short Term Goals - 05/28/18 1453      PT SHORT TERM GOAL #1   Title  Patient reports prosthesis wear daily >8 hrs total /day without skin issues.     Status  New    Target Date  06/19/18      PT SHORT TERM GOAL #2   Title  Patient stands without UE support for 1 minute with supervision, reaches 2" & looks to side with min guard.     Status  New    Target Date  06/19/18      PT SHORT TERM GOAL #3   Title  Patient ambulates 250' with RW & prosthesis with supervision.     Status  New    Target Date  06/19/18      PT SHORT TERM GOAL #4   Title  Patient ambulates 18' using cane & counter support with minA.     Status  New    Target Date  06/19/18      PT SHORT TERM GOAL #5   Title  Patient negotiates stairs with 1 rail/cane, ramps & curbs with RW & prosthesis with supervision.      Status  New    Target Date  06/19/18        PT Long Term Goals - 05/28/18 1458      PT LONG TERM GOAL #1   Title  Patient verbalizes & demonstrates proper prosthetic care to enable safe use of prosthesis. (All LTGs Target Date: 07/17/2018)    Time  12    Period  Weeks    Status  On-going    Target Date  07/17/18      PT LONG TERM GOAL #2   Title  Patient tolerates prosthesis wear >80% of awake hours without skin issues or limb pain to enable function throughout his day.     Time  12    Period  Weeks    Status  On-going    Target Date  07/17/18       PT LONG TERM GOAL #3   Title  Berg Balance >20/56 to reduce fall risk.     Time  12    Period  Weeks    Status  Revised    Target Date  07/17/18      PT LONG TERM GOAL #4   Title  Patient ambulates 300' with LRAD & Prosthesis outdoors including grass modified independent for community mobility.     Time  12  Period  Weeks    Status  Revised    Target Date  07/17/18      PT LONG TERM GOAL #5   Title  Patient negotiates ramps, curbs & stairs with LRAD & prosthesis modified independent to enable community access.     Time  12    Period  Weeks    Status  On-going    Target Date  07/17/18      PT LONG TERM GOAL #6   Title  Patient ambulates around furniture carrying cup of water with LRAD & prosthesis modified independent for household mobility.     Time  12    Period  Weeks    Status  On-going    Target Date  07/17/18           06/01/18 1322  Plan  Clinical Impression Statement Today's skilled session continued to address gait and barriers with prosthesis/RW. Pt with increased consecutive and overall gait distance today before needing rest breaks. Pt is making steady progress toward goals and should benefit from continued PT to progress toward unmet goals.   Pt will benefit from skilled therapeutic intervention in order to improve on the following deficits Abnormal gait;Decreased activity tolerance;Decreased balance;Decreased endurance;Decreased knowledge of use of DME;Decreased mobility;Impaired flexibility;Decreased strength;Postural dysfunction;Prosthetic Dependency  Rehab Potential Good  PT Frequency 2x / week  PT Duration 12 weeks  PT Treatment/Interventions ADLs/Self Care Home Management;DME Instruction;Gait training;Stair training;Canalith Repostioning;Functional mobility training;Therapeutic activities;Therapeutic exercise;Balance training;Neuromuscular re-education;Patient/family education;Prosthetic Training;Vestibular  PT Next Visit Plan prosthetic gait with RW,  stairs (1 rail & cane), ramps & curbs, gait with cane & counter support.   Consulted and Agree with Plan of Care Patient;Family member/caregiver  Family Member Consulted wife, Marcus Hendrix        Patient will benefit from skilled therapeutic intervention in order to improve the following deficits and impairments:  Abnormal gait, Decreased activity tolerance, Decreased balance, Decreased endurance, Decreased knowledge of use of DME, Decreased mobility, Impaired flexibility, Decreased strength, Postural dysfunction, Prosthetic Dependency  Visit Diagnosis: Muscle weakness (generalized)  Unsteadiness on feet  Other abnormalities of gait and mobility  Abnormal posture     Problem List Patient Active Problem List   Diagnosis Date Noted  . Pain of right lower extremity   . Type 2 diabetes mellitus with peripheral neuropathy (HCC)   . Benign essential HTN   . Coronary artery disease involving coronary bypass graft of native heart without angina pectoris   . Chronic combined systolic and diastolic congestive heart failure ([place])   . Leukocytosis   . Transaminitis   . Unilateral AKA, right ([place])   . Cardiac arrest ([place])   . Bradycardia 12/06/2017  . History of atherosclerotic cardiovascular disease 12/06/2017  . Diabetic foot infection ([place]) 11/28/2017  . Acute CHF (congestive heart failure) ([place]) 11/28/2017  . Rectal bleeding 10/04/2013  . Heme positive stool 10/04/2013  . Community acquired pneumonia 07/30/2013  . DM type 2 (diabetes mellitus, type 2) ([place]) 07/30/2013  . HTN (hypertension), benign 07/30/2013  . CAROTID BRUITS, BILATERAL 07/16/2010  . MITRAL VALVE PROLAPSE 08/22/2009  . DM 07/22/2009  . Atherosclerotic cardiovascular disease 07/22/2009  . 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 ERECTILE DYSFUNCTION 05/27/2008  . INGUINAL HERNIA, RIGHT 10/19/2007  . ANEMIA, NORMOCYTIC 10/09/2007  . Dyslipidemia 12/19/2006  . PERIPHERAL NEUROPATHY 12/19/2006  . Unspecified  glaucoma 12/19/2006  . Essential hypertension 12/19/2006  . MYOCARDIAL INFARCTION, HX OF 12/19/2006  . PERIPHERAL VASCULAR DISEASE 12/19/2006    Willow Ora, PTA,  [facility] [address] [place], [place] [address] [phone] 06/03/18, 8:32 AM  Name: Marcus Hendrix MRN: [id number] Date of Birth: 09-Oct-1941

## 2018-06-04 ENCOUNTER — Ambulatory Visit: Payer: Medicare Other | Admitting: Physical Therapy

## 2018-06-04 ENCOUNTER — Encounter: Payer: Self-pay | Admitting: Physical Therapy

## 2018-06-04 VITALS — HR 68

## 2018-06-04 DIAGNOSIS — R2689 Other abnormalities of gait and mobility: Secondary | ICD-10-CM

## 2018-06-04 DIAGNOSIS — M6281 Muscle weakness (generalized): Secondary | ICD-10-CM | POA: Diagnosis not present

## 2018-06-04 DIAGNOSIS — R2681 Unsteadiness on feet: Secondary | ICD-10-CM

## 2018-06-04 NOTE — Patient Instructions (Signed)
Access Code: [email or web address]  URL: [email or web address]  Date: 06/04/2018  Prepared by: Barry Brunner   Exercises  Sidelying Hip Abduction - 10 reps - 3 sets - 1x daily - 7x weekly

## 2018-06-04 NOTE — Therapy (Signed)
[facility] [address] [place], [place], [address] Phone: [phone]   Fax:  [phone]  Physical Therapy Treatment  Patient Details  Name: Marcus [facility] MRN: [id number] Date of Birth: 10-01-1941 Referring Provider: Harold Barban, MD   Encounter Date: 06/04/2018   Progress Note Reporting Period 04/21/2018 to 06/04/2018   See note below for Objective Data and Assessment of Progress/Goals.       PT End of Session - 06/04/18 2054    Visit Number  10    Number of Visits  25    Date for PT Re-Evaluation  07/20/18    Authorization Type  BCBS Medicare    PT Start Time  0940 pt late arrival-transportation    PT Stop Time  1016    PT Time Calculation (min)  36 min    Equipment Utilized During Treatment  Gait belt    Activity Tolerance  Patient tolerated treatment well    Behavior During Therapy  WFL for tasks assessed/performed       Past Medical History:  Diagnosis Date  . Cerebrovascular disease   . Diabetes mellitus   . Diabetes mellitus ([place])   . ED (erectile dysfunction)   . Glaucoma, left eye   . History of atherosclerotic cardiovascular disease   . History of myocardial infarction   . Hyperlipidemia   . Hypertension   . Inguinal hernia    Right  . Normocytic anemia   . Peripheral neuropathy   . PVD (peripheral vascular disease) ([place])   . Tubulovillous adenoma of colon     Past Surgical History:  Procedure Laterality Date  . AMPUTATION Right 12/12/2017   Procedure: AMPUTATION ABOVE KNEE RIGHT;  Surgeon: Serafina Mitchell, MD;  Location: [facility];  Service: Vascular;  Laterality: Right;  . CHOLECYSTECTOMY    . COLONOSCOPY [name] 07/22/2014   Procedure: COLONOSCOPY;  Surgeon: [name], MD;  [facility];  Service: Endoscopy;  Laterality: [name];  1235-moved to [place] notified pt  . CORONARY ARTERY BYPASS GRAFT  2002   x6  . INGUINAL HERNIA REPAIR  2004   Left  . LOWER EXTREMITY  ANGIOGRAPHY [name] 12/08/2017   Procedure: LOWER EXTREMITY ANGIOGRAPHY;  Surgeon: Elam Dutch, MD;  Location: [facility];  Service: Cardiovascular;  Laterality: [name];  . PERIPHERAL VASCULAR INTERVENTION Bilateral 12/08/2017   Procedure: PERIPHERAL VASCULAR INTERVENTION;  Surgeon: Elam Dutch, MD;  Location: [facility];  Service: Cardiovascular;  Laterality: Bilateral;  [place]    Vitals:   06/04/18 0951  Pulse: 68  After walking 220 ft with RW and prosthesis    Subjective Assessment - 06/04/18 0951    Subjective  No new complaints. No falls or pain. Wore leg 9 hours yesterday (including during his nap)    Patient is accompained by:  Family member spouse, [employer]    Pertinent History  R TFA, CAD, cardiac arrest, CABG x2, DM, HTN, peripheral neuropathy, bradycardia, glaucoma, PVD     Limitations  Lifting;Standing;Walking;House hold activities    Patient Stated Goals  To walk with prosthesis around house, yard & store, fishing    Currently in Pain?  No/denies                       [facility] Adult PT Treatment/Exercise - 06/04/18 2039      Transfers   Transfers  Stand to Sit;Sit to Stand    Sit to Stand  5:  Supervision    Sit to Stand Details (indicate cue type and reason)  cuing x 1 of 4 for sequencing/safety with RW    Stand to Sit  5: Supervision;4: Min assist    Stand to Sit Details  min assist x 1 of 4 due to uncontrolled descent      Ambulation/Gait   Ambulation/Gait  Yes    Ambulation/Gait Assistance  4: Min guard;4: Min assist    Ambulation/Gait Assistance Details  minguard to min assist with RW due to one instance of Rt knee not fully extended when initiated left step (was able to support himself with bil UEs on RW); assist for weight shift over RLE to allow incr LLE step length; with cane in LUE and table under Rt hand, min assist for balance and for facilitation of upright posture and rt hip extension/abduction    Ambulation Distance (Feet)   220 Feet ; 60   Assistive device  Rolling walker;Straight cane counter on his right; TFA prosthesis    Gait Pattern  Step-through pattern;Decreased stride length;Decreased stance time - right;Decreased step length - left;Narrow base of support;Right circumduction    Ambulation Surface  Level;Indoor      Knee/Hip Exercises: Standing   Hip Abduction  Stengthening;Right;Left;1 set;10 reps    Abduction Limitations  hands on counter; difficulty abducting RLE (tends to move into hip flexion) therefore did not assign for HEP; assigned in sidelying      Knee/Hip Exercises: Sidelying   Hip ABduction Limitations  shown video and provided written handout; did not have time to practice             PT Education - 06/04/18 2053    Education Details  addition to HEP    Person(s) Educated  Patient    Methods  Explanation;Handout;Demonstration    Comprehension  Verbalized understanding;Need further instruction       PT Short Term Goals - 05/28/18 1453      PT SHORT TERM GOAL #1   Title  Patient reports prosthesis wear daily >8 hrs total /day without skin issues.     Status  New    Target Date  06/19/18      PT SHORT TERM GOAL #2   Title  Patient stands without UE support for 1 minute with supervision, reaches 2" & looks to side with min guard.     Status  New    Target Date  06/19/18      PT SHORT TERM GOAL #3   Title  Patient ambulates 250' with RW & prosthesis with supervision.     Status  New    Target Date  06/19/18      PT SHORT TERM GOAL #4   Title  Patient ambulates 61' using cane & counter support with minA.     Status  New    Target Date  06/19/18      PT SHORT TERM GOAL #5   Title  Patient negotiates stairs with 1 rail/cane, ramps & curbs with RW & prosthesis with supervision.      Status  New    Target Date  06/19/18        PT Long Term Goals - 05/28/18 1458      PT LONG TERM GOAL #1   Title  Patient verbalizes & demonstrates proper prosthetic care to enable  safe use of prosthesis. (All LTGs Target Date: 07/17/2018)    Time  12    Period  Weeks    Status  On-going    Target Date  07/17/18      PT LONG TERM GOAL #2   Title  Patient tolerates prosthesis wear >80% of awake hours without skin issues or limb pain to enable function throughout his day.     Time  12    Period  Weeks    Status  On-going    Target Date  07/17/18      PT LONG TERM GOAL #3   Title  Berg Balance >20/56 to reduce fall risk.     Time  12    Period  Weeks    Status  Revised    Target Date  07/17/18      PT LONG TERM GOAL #4   Title  Patient ambulates 300' with LRAD & Prosthesis outdoors including grass modified independent for community mobility.     Time  12    Period  Weeks    Status  Revised    Target Date  07/17/18      PT LONG TERM GOAL #5   Title  Patient negotiates ramps, curbs & stairs with LRAD & prosthesis modified independent to enable community access.     Time  12    Period  Weeks    Status  On-going    Target Date  07/17/18      PT LONG TERM GOAL #6   Title  Patient ambulates around furniture carrying cup of water with LRAD & prosthesis modified independent for household mobility.     Time  12    Period  Weeks    Status  On-going    Target Date  07/17/18            Plan - 06/04/18 2055    Clinical Impression Statement  Skilled session addressed RLE strength for improved stability with gait and gait training with prosthesis and RW vs cane/counter. Session short due to pt's late arrival due to transportation issue. Pt has continued to improve his total wear time of prosthesis and denies complications. Pt can continue to benefit from skilled PT to work toward goals.     Rehab Potential  Good    PT Frequency  2x / week    PT Duration  12 weeks    PT Treatment/Interventions  ADLs/Self Care Home Management;DME Instruction;Gait training;Stair training;Canalith Repostioning;Functional mobility training;Therapeutic activities;Therapeutic  exercise;Balance training;Neuromuscular re-education;Patient/family education;Prosthetic Training;Vestibular    PT Next Visit Plan  prosthetic gait with RW, stairs (1 rail & SPC quad tip), ramps & curbs, gait with cane & counter support.     Consulted and Agree with Plan of Care  Patient       Patient will benefit from skilled therapeutic intervention in order to improve the following deficits and impairments:  Abnormal gait, Decreased activity tolerance, Decreased balance, Decreased endurance, Decreased knowledge of use of DME, Decreased mobility, Impaired flexibility, Decreased strength, Postural dysfunction, Prosthetic Dependency  Visit Diagnosis: Muscle weakness (generalized)  Unsteadiness on feet  Other abnormalities of gait and mobility     Problem List Patient Active Problem List   Diagnosis Date Noted  . Pain of right lower extremity   . Type 2 diabetes mellitus with peripheral neuropathy (HCC)   . Benign essential HTN   . Coronary artery disease involving coronary bypass graft of native heart without angina pectoris   . Chronic combined systolic and diastolic congestive heart failure ([place])   . Leukocytosis   . Transaminitis   . Unilateral AKA, right ([place])   .  Cardiac arrest ([place])   . Bradycardia 12/06/2017  . History of atherosclerotic cardiovascular disease 12/06/2017  . Diabetic foot infection ([place]) 11/28/2017  . Acute CHF (congestive heart failure) ([place]) 11/28/2017  . Rectal bleeding 10/04/2013  . Heme positive stool 10/04/2013  . Community acquired pneumonia 07/30/2013  . DM type 2 (diabetes mellitus, type 2) ([place]) 07/30/2013  . HTN (hypertension), benign 07/30/2013  . CAROTID BRUITS, BILATERAL 07/16/2010  . MITRAL VALVE PROLAPSE 08/22/2009  . DM 07/22/2009  . Atherosclerotic cardiovascular disease 07/22/2009  . 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 ERECTILE DYSFUNCTION 05/27/2008  . INGUINAL HERNIA, RIGHT 10/19/2007  . ANEMIA, NORMOCYTIC 10/09/2007  .  Dyslipidemia 12/19/2006  . PERIPHERAL NEUROPATHY 12/19/2006  . Unspecified glaucoma 12/19/2006  . Essential hypertension 12/19/2006  . MYOCARDIAL INFARCTION, HX OF 12/19/2006  . PERIPHERAL VASCULAR DISEASE 12/19/2006    Rexanne Mano, PT 06/04/2018, 8:59 PM  [facility] [address] [place], [place], [address] Phone: [phone]   Fax:  [phone]  Name: Marcus Hendrix MRN: [id number] Date of Birth: 26-Jul-1941

## 2018-06-08 ENCOUNTER — Encounter: Payer: Self-pay | Admitting: Physical Therapy

## 2018-06-08 ENCOUNTER — Ambulatory Visit: Payer: Medicare Other | Admitting: Physical Therapy

## 2018-06-08 DIAGNOSIS — R293 Abnormal posture: Secondary | ICD-10-CM

## 2018-06-08 DIAGNOSIS — M6281 Muscle weakness (generalized): Secondary | ICD-10-CM

## 2018-06-08 DIAGNOSIS — R2689 Other abnormalities of gait and mobility: Secondary | ICD-10-CM

## 2018-06-08 DIAGNOSIS — R2681 Unsteadiness on feet: Secondary | ICD-10-CM

## 2018-06-08 NOTE — Patient Instructions (Signed)
SINGLE LIMB STANCE    Stand holding counter top with both hands.  Shift your right hip forwards over right leg. Raise left leg. Hold _10 seconds keeping your chest up tall. _5__ reps per set, _2__ sets per day

## 2018-06-09 NOTE — Therapy (Signed)
[name] [address] [place], [place], [address] Phone: [phone]   Fax:  [phone]  Physical Therapy Treatment  Patient Details  Name: Marcus [facility]rix MRN: [id number] Date of Birth: [birth year] Referring Provider: Harold Barban, MD   Encounter Date: 06/08/2018  PT End of Session - 06/09/18 1008    Visit Number  11    Number of Visits  25    Date for PT Re-Evaluation  07/20/18    Authorization Type  [employer] Medicare - 10th visit PN    PT Start Time  1015    PT Stop Time  1100    PT Time Calculation (min)  45 min    Equipment Utilized During Treatment  Gait belt    Activity Tolerance  Patient tolerated treatment well    Behavior During Therapy  [facility] for tasks assessed/performed       Past Medical History:  Diagnosis Date  . Cerebrovascular disease   . Diabetes mellitus   . Diabetes mellitus ([place])   . ED (erectile dysfunction)   . Glaucoma, left eye   . History of atherosclerotic cardiovascular disease   . History of myocardial infarction   . Hyperlipidemia   . Hypertension   . Inguinal hernia    Right  . Normocytic anemia   . Peripheral neuropathy   . PVD (peripheral vascular disease) ([place])   . Tubulovillous adenoma of colon     Past Surgical History:  Procedure Laterality Date  . AMPUTATION Right 12/12/2017   Procedure: AMPUTATION ABOVE KNEE RIGHT;  Surgeon: Serafina Mitchell, MD;  Location: [facility];  Service: Vascular;  Laterality: Right;  . CHOLECYSTECTOMY    . COLONOSCOPY [name] 07/22/2014   Procedure: COLONOSCOPY;  Surgeon: [name], MD;  [facility];  Service: Endoscopy;  Laterality: [name];  1235-moved to [place] notified pt  . CORONARY ARTERY BYPASS GRAFT  2002   x6  . INGUINAL HERNIA REPAIR  2004   Left  . LOWER EXTREMITY ANGIOGRAPHY [name] 12/08/2017   Procedure: LOWER EXTREMITY ANGIOGRAPHY;  Surgeon: Elam Dutch, MD;  Location: [facility];  Service:  Cardiovascular;  Laterality: [name];  . PERIPHERAL VASCULAR INTERVENTION Bilateral 12/08/2017   Procedure: PERIPHERAL VASCULAR INTERVENTION;  Surgeon: Elam Dutch, MD;  Location: [facility];  Service: Cardiovascular;  Laterality: Bilateral;  [place]    There were no vitals filed for this visit.  Subjective Assessment - 06/08/18 1017    Subjective  Was tired after therapy last week.  Wearing prosthesis 8-9 hours/day.  Is wearing deodorant on the leg with good control of sweating; some mild itching after removing prosthesis but no rash or skin issues.    Patient is accompained by:  Family member spouse, [name]    Pertinent History  R TFA, CAD, cardiac arrest, CABG x2, DM, HTN, peripheral neuropathy, bradycardia, glaucoma, PVD     Limitations  Lifting;Standing;Walking;House hold activities    Patient Stated Goals  To walk with prosthesis around house, yard & store, fishing    Currently in Pain?  No/denies                       [facility] Adult PT Treatment/Exercise - 06/08/18 1035      Ambulation/Gait   Ambulation/Gait  Yes    Ambulation/Gait Assistance  4: Min assist    Ambulation/Gait Assistance Details  gait training with cane in LUE and table on R side beginning with  RUE supported on table and then progressing to short distances lifting RUE off of table and having UE support through cane only.  Pt required cues to shift hip and weight forwards to mid R foot before advancing RLE; cues also for upright trunk    Ambulation Distance (Feet)  102 Feet    Assistive device  Straight cane    Gait Pattern  Step-to pattern;Decreased step length - right;Decreased step length - left;Decreased stance time - right;Left flexed knee in stance    Ambulation Surface  Level;Indoor    Stairs  Yes    Stairs Assistance  4: Min assist    Stairs Assistance Details (indicate cue type and reason)  first with bilat UE support on rails and then with LUE on cane, RUE on rail.  Ascending with LLE  and descending with RLE    Stair Management Technique  Two rails;One rail Right;Step to pattern;Forwards;With cane    Ramp  4: Min assist    Ramp Details (indicate cue type and reason)  with RW and cues to lead with RLE      Therapeutic Activites    Therapeutic Activities  Other Therapeutic Activities    Other Therapeutic Activities  Adjusted the length of R leg rest to place hip at 90 deg flexion to decrease risk for hip flexion tightness and discomfort from limb socket.      Knee/Hip Exercises: Standing   SLS  RLE x 5 reps with 10 second hold and bilat UE support focusing on weight shifting from LLE positioned behind pt forwards over RLE and maintaining upright trunk with hip extension             PT Education - 06/09/18 1007    Education Details  Gait training with cane, ramp and stair negotiation training    Person(s) Educated  Patient    Methods  Explanation;Demonstration    Comprehension  Need further instruction       PT Short Term Goals - 05/28/18 1453      PT SHORT TERM GOAL #1   Title  Patient reports prosthesis wear daily >8 hrs total /day without skin issues.     Status  New    Target Date  06/19/18      PT SHORT TERM GOAL #2   Title  Patient stands without UE support for 1 minute with supervision, reaches 2" & looks to side with min guard.     Status  New    Target Date  06/19/18      PT SHORT TERM GOAL #3   Title  Patient ambulates 250' with RW & prosthesis with supervision.     Status  New    Target Date  06/19/18      PT SHORT TERM GOAL #4   Title  Patient ambulates 64' using cane & counter support with minA.     Status  New    Target Date  06/19/18      PT SHORT TERM GOAL #5   Title  Patient negotiates stairs with 1 rail/cane, ramps & curbs with RW & prosthesis with supervision.      Status  New    Target Date  06/19/18        PT Long Term Goals - 05/28/18 1458      PT LONG TERM GOAL #1   Title  Patient verbalizes & demonstrates proper  prosthetic care to enable safe use of prosthesis. (All LTGs Target Date: 07/17/2018)    Time  12    Period  Weeks    Status  On-going    Target Date  07/17/18      PT LONG TERM GOAL #2   Title  Patient tolerates prosthesis wear >80% of awake hours without skin issues or limb pain to enable function throughout his day.     Time  12    Period  Weeks    Status  On-going    Target Date  07/17/18      PT LONG TERM GOAL #3   Title  Berg Balance >20/56 to reduce fall risk.     Time  12    Period  Weeks    Status  Revised    Target Date  07/17/18      PT LONG TERM GOAL #4   Title  Patient ambulates 300' with LRAD & Prosthesis outdoors including grass modified independent for community mobility.     Time  12    Period  Weeks    Status  Revised    Target Date  07/17/18      PT LONG TERM GOAL #5   Title  Patient negotiates ramps, curbs & stairs with LRAD & prosthesis modified independent to enable community access.     Time  12    Period  Weeks    Status  On-going    Target Date  07/17/18      PT LONG TERM GOAL #6   Title  Patient ambulates around furniture carrying cup of water with LRAD & prosthesis modified independent for household mobility.     Time  12    Period  Weeks    Status  On-going    Target Date  07/17/18            Plan - 06/09/18 1008    Clinical Impression Statement  Treatment session continued to focus on gait training with cane around table progressing to short distances without UE support, stair negotiation training with rail and cane and review of safe ramp negotiation with RW.  Pt required cues and assistance to maintain upright trunk and anterior-lateral weight shift over prosthesis.   Provided pt with SLS exercise at counter top for home to continue to focus on weight shifting, stance phase control and proximal hip strengthening.  Will continue to progress towards LTG.    Rehab Potential  Good    PT Frequency  2x / week    PT Duration  12 weeks    PT  Treatment/Interventions  ADLs/Self Care Home Management;DME Instruction;Gait training;Stair training;Canalith Repostioning;Functional mobility training;Therapeutic activities;Therapeutic exercise;Balance training;Neuromuscular re-education;Patient/family education;Prosthetic Training;Vestibular    PT Next Visit Plan  Proximal hip strengthening/weight shifting, prosthetic gait with RW, stairs (1 rail & SPC quad tip), ramps & curbs, gait with cane & counter support.     Consulted and Agree with Plan of Care  Patient       Patient will benefit from skilled therapeutic intervention in order to improve the following deficits and impairments:  Abnormal gait, Decreased activity tolerance, Decreased balance, Decreased endurance, Decreased knowledge of use of DME, Decreased mobility, Impaired flexibility, Decreased strength, Postural dysfunction, Prosthetic Dependency  Visit Diagnosis: Muscle weakness (generalized)  Unsteadiness on feet  Other abnormalities of gait and mobility  Abnormal posture     Problem List Patient Active Problem List   Diagnosis Date Noted  . Pain of right lower extremity   . Type 2 diabetes mellitus with peripheral neuropathy (HCC)   . Benign essential HTN   .  Coronary artery disease involving coronary bypass graft of native heart without angina pectoris   . Chronic combined systolic and diastolic congestive heart failure ([place])   . Leukocytosis   . Transaminitis   . Unilateral AKA, right ([place])   . Cardiac arrest ([place])   . Bradycardia 12/06/2017  . History of atherosclerotic cardiovascular disease 12/06/2017  . Diabetic foot infection ([place]) 11/28/2017  . Acute CHF (congestive heart failure) ([place]) 11/28/2017  . Rectal bleeding 10/04/2013  . Heme positive stool 10/04/2013  . Community acquired pneumonia 07/30/2013  . DM type 2 (diabetes mellitus, type 2) ([place]) 07/30/2013  . HTN (hypertension), benign 07/30/2013  . CAROTID BRUITS, BILATERAL 07/16/2010  . MITRAL  VALVE PROLAPSE 08/22/2009  . DM 07/22/2009  . Atherosclerotic cardiovascular disease 07/22/2009  . 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 ERECTILE DYSFUNCTION 05/27/2008  . INGUINAL HERNIA, RIGHT 10/19/2007  . ANEMIA, NORMOCYTIC 10/09/2007  . Dyslipidemia 12/19/2006  . PERIPHERAL NEUROPATHY 12/19/2006  . Unspecified glaucoma 12/19/2006  . Essential hypertension 12/19/2006  . MYOCARDIAL INFARCTION, HX OF 12/19/2006  . PERIPHERAL VASCULAR DISEASE 12/19/2006    Rico Junker, PT, DPT 06/09/18    10:15 AM    [facility] [address] [place], [place], [address] Phone: [phone]   Fax:  [phone]  Name: Marcus [facility] MRN: [id number] Date of Birth: Apr 30, 1941

## 2018-06-11 ENCOUNTER — Ambulatory Visit: Payer: Medicare Other | Admitting: Physical Therapy

## 2018-06-11 ENCOUNTER — Encounter: Payer: Self-pay | Admitting: Physical Therapy

## 2018-06-11 DIAGNOSIS — M6281 Muscle weakness (generalized): Secondary | ICD-10-CM

## 2018-06-11 DIAGNOSIS — R293 Abnormal posture: Secondary | ICD-10-CM

## 2018-06-11 DIAGNOSIS — R2689 Other abnormalities of gait and mobility: Secondary | ICD-10-CM

## 2018-06-11 DIAGNOSIS — R2681 Unsteadiness on feet: Secondary | ICD-10-CM

## 2018-06-12 NOTE — Therapy (Signed)
[facility] [address] [place], [place], [address] Phone: [phone]   Fax:  [phone]  Physical Therapy Treatment  Patient Details  Name: Marcus Hendrix MRN: [id number] Date of Birth: 01-28-1941 Referring Provider: Harold Barban, MD   Encounter Date: 06/11/2018  PT End of Session - 06/11/18 1649    Visit Number  12    Number of Visits  25    Date for PT Re-Evaluation  07/20/18    Authorization Type  [employer] Medicare - 10th visit PN    PT Start Time  0930    PT Stop Time  1015    PT Time Calculation (min)  45 min    Equipment Utilized During Treatment  Gait belt    Activity Tolerance  Patient tolerated treatment well    Behavior During Therapy  [facility] for tasks assessed/performed       Past Medical History:  Diagnosis Date  . Cerebrovascular disease   . Diabetes mellitus   . Diabetes mellitus ([place])   . ED (erectile dysfunction)   . Glaucoma, left eye   . History of atherosclerotic cardiovascular disease   . History of myocardial infarction   . Hyperlipidemia   . Hypertension   . Inguinal hernia    Right  . Normocytic anemia   . Peripheral neuropathy   . PVD (peripheral vascular disease) ([place])   . Tubulovillous adenoma of colon     Past Surgical History:  Procedure Laterality Date  . AMPUTATION Right 12/12/2017   Procedure: AMPUTATION ABOVE KNEE RIGHT;  Surgeon: Serafina Mitchell, MD;  Location: [facility];  Service: Vascular;  Laterality: Right;  . CHOLECYSTECTOMY    . COLONOSCOPY [name] 07/22/2014   Procedure: COLONOSCOPY;  Surgeon: [name], MD;  [facility];  Service: Endoscopy;  Laterality: [name];  1235-moved to [place] notified pt  . CORONARY ARTERY BYPASS GRAFT  2002   x6  . INGUINAL HERNIA REPAIR  2004   Left  . LOWER EXTREMITY ANGIOGRAPHY [name] 12/08/2017   Procedure: LOWER EXTREMITY ANGIOGRAPHY;  Surgeon: Elam Dutch, MD;  Location: [facility];  Service:  Cardiovascular;  Laterality: [name];  . PERIPHERAL VASCULAR INTERVENTION Bilateral 12/08/2017   Procedure: PERIPHERAL VASCULAR INTERVENTION;  Surgeon: Elam Dutch, MD;  Location: [facility];  Service: Cardiovascular;  Laterality: Bilateral;  [place]    There were no vitals filed for this visit.         06/11/18 0845  Ambulation/Gait  Ambulation/Gait Yes  Ambulation/Gait Assistance 5: Supervision  Ambulation/Gait Assistance Details verbal & demo cues on gait with straight cane & counter top: sequencing & weight shift  Ambulation Distance (Feet) 40 Feet (40' X 3)  Assistive device Straight cane;Prosthesis;Other (Comment) (RUE on counter & LUE cane with quad tip)  Ambulation Surface Indoor;Level  Stairs Yes  Stairs Assistance 4: Min assist;4: Min guard  Stairs Assistance Details (indicate cue type and reason) verbal & demo cues on technique with single rail & cane with varying location of rail (2 wide rails so has to use only 1, recommend rail in RUE, rail only on left ascending and rail only on right ascending)  Stair Management Technique One rail Right;One rail Left;With cane;Step to pattern;Forwards  Number of Stairs 4 (3 reps)  Prosthetics  Prosthetic Care Comments  tightening suspension strap during day PT recommended after toileting. As residual limb seats deeper in socket if strap not adjusted then pistoning & decreased clearance in swing  Current prosthetic wear tolerance (days/week)  daily  Current prosthetic wear tolerance (#hours/day)  PT recommend increase from arising to evening bath  Education Provided Proper wear schedule/adjustment;Proper Donning  Person(s) Educated Patient;Spouse  Education Method Explanation;Verbal cues  Education Method Verbalized understanding;Verbal cues required;Needs further instruction               06/11/18 0920  PT Education  Education provided Yes  Education Details increasing activity level: high frequency of short  walks - room to room, sit in household chairs not w/c;  4-6 medium walks - in/out house or community buldings;  1-2 max tolerable distance with wife or family member following with w/c.   Person(s) Educated Patient;Spouse  Methods Explanation;Verbal cues  Comprehension Verbalized understanding;Verbal cues required;Need further instruction                PT Short Term Goals - 05/28/18 1453      PT SHORT TERM GOAL #1   Title  Patient reports prosthesis wear daily >8 hrs total /day without skin issues.     Status  New    Target Date  06/19/18      PT SHORT TERM GOAL #2   Title  Patient stands without UE support for 1 minute with supervision, reaches 2" & looks to side with min guard.     Status  New    Target Date  06/19/18      PT SHORT TERM GOAL #3   Title  Patient ambulates 250' with RW & prosthesis with supervision.     Status  New    Target Date  06/19/18      PT SHORT TERM GOAL #4   Title  Patient ambulates 64' using cane & counter support with minA.     Status  New    Target Date  06/19/18      PT SHORT TERM GOAL #5   Title  Patient negotiates stairs with 1 rail/cane, ramps & curbs with RW & prosthesis with supervision.      Status  New    Target Date  06/19/18        PT Long Term Goals - 05/28/18 1458      PT LONG TERM GOAL #1   Title  Patient verbalizes & demonstrates proper prosthetic care to enable safe use of prosthesis. (All LTGs Target Date: 07/17/2018)    Time  12    Period  Weeks    Status  On-going    Target Date  07/17/18      PT LONG TERM GOAL #2   Title  Patient tolerates prosthesis wear >80% of awake hours without skin issues or limb pain to enable function throughout his day.     Time  12    Period  Weeks    Status  On-going    Target Date  07/17/18      PT LONG TERM GOAL #3   Title  Berg Balance >20/56 to reduce fall risk.     Time  12    Period  Weeks    Status  Revised    Target Date  07/17/18      PT LONG TERM GOAL #4    Title  Patient ambulates 300' with LRAD & Prosthesis outdoors including grass modified independent for community mobility.     Time  12    Period  Weeks    Status  Revised    Target Date  07/17/18      PT  LONG TERM GOAL #5   Title  Patient negotiates ramps, curbs & stairs with LRAD & prosthesis modified independent to enable community access.     Time  12    Period  Weeks    Status  On-going    Target Date  07/17/18      PT LONG TERM GOAL #6   Title  Patient ambulates around furniture carrying cup of water with LRAD & prosthesis modified independent for household mobility.     Time  12    Period  Weeks    Status  On-going    Target Date  07/17/18            Plan - 06/11/18 1858    Clinical Impression Statement  Today's skilled session focused on negotiating stairs with single rail & cane with quad tip in varying location of rail and gait near counter with cane.     Rehab Potential  Good    PT Frequency  2x / week    PT Duration  12 weeks    PT Treatment/Interventions  ADLs/Self Care Home Management;DME Instruction;Gait training;Stair training;Canalith Repostioning;Functional mobility training;Therapeutic activities;Therapeutic exercise;Balance training;Neuromuscular re-education;Patient/family education;Prosthetic Training;Vestibular    PT Next Visit Plan  check STGs, Proximal hip strengthening/weight shifting, prosthetic gait with RW, stairs (1 rail & SPC quad tip), ramps & curbs, gait with cane & counter support.     Consulted and Agree with Plan of Care  Patient       Patient will benefit from skilled therapeutic intervention in order to improve the following deficits and impairments:  Abnormal gait, Decreased activity tolerance, Decreased balance, Decreased endurance, Decreased knowledge of use of DME, Decreased mobility, Impaired flexibility, Decreased strength, Postural dysfunction, Prosthetic Dependency  Visit Diagnosis: Muscle weakness (generalized)  Unsteadiness on  feet  Other abnormalities of gait and mobility  Abnormal posture     Problem List Patient Active Problem List   Diagnosis Date Noted  . Pain of right lower extremity   . Type 2 diabetes mellitus with peripheral neuropathy (HCC)   . Benign essential HTN   . Coronary artery disease involving coronary bypass graft of native heart without angina pectoris   . Chronic combined systolic and diastolic congestive heart failure ([place])   . Leukocytosis   . Transaminitis   . Unilateral AKA, right ([place])   . Cardiac arrest ([place])   . Bradycardia 12/06/2017  . History of atherosclerotic cardiovascular disease 12/06/2017  . Diabetic foot infection ([place]) 11/28/2017  . Acute CHF (congestive heart failure) ([place]) 11/28/2017  . Rectal bleeding 10/04/2013  . Heme positive stool 10/04/2013  . Community acquired pneumonia 07/30/2013  . DM type 2 (diabetes mellitus, type 2) ([place]) 07/30/2013  . HTN (hypertension), benign 07/30/2013  . CAROTID BRUITS, BILATERAL 07/16/2010  . MITRAL VALVE PROLAPSE 08/22/2009  . DM 07/22/2009  . Atherosclerotic cardiovascular disease 07/22/2009  . 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 ERECTILE DYSFUNCTION 05/27/2008  . INGUINAL HERNIA, RIGHT 10/19/2007  . ANEMIA, NORMOCYTIC 10/09/2007  . Dyslipidemia 12/19/2006  . PERIPHERAL NEUROPATHY 12/19/2006  . Unspecified glaucoma 12/19/2006  . Essential hypertension 12/19/2006  . MYOCARDIAL INFARCTION, HX OF 12/19/2006  . PERIPHERAL VASCULAR DISEASE 12/19/2006    Jamey Reas PT, DPT 06/12/2018, 7:00 PM  [facility] [address] [place], [place], [address] Phone: [phone]   Fax:  [phone]  Name: Marcus Hendrix MRN: [id number] Date of Birth: [birth year]

## 2018-06-15 ENCOUNTER — Ambulatory Visit: Payer: Medicare Other | Attending: Surgery | Admitting: Physical Therapy

## 2018-06-15 ENCOUNTER — Encounter: Payer: Self-pay | Admitting: Physical Therapy

## 2018-06-15 DIAGNOSIS — R2681 Unsteadiness on feet: Secondary | ICD-10-CM | POA: Diagnosis present

## 2018-06-15 DIAGNOSIS — M6281 Muscle weakness (generalized): Secondary | ICD-10-CM | POA: Insufficient documentation

## 2018-06-15 DIAGNOSIS — R293 Abnormal posture: Secondary | ICD-10-CM | POA: Diagnosis present

## 2018-06-15 DIAGNOSIS — R2689 Other abnormalities of gait and mobility: Secondary | ICD-10-CM | POA: Diagnosis present

## 2018-06-15 NOTE — Therapy (Signed)
[facility] [address] [place], [place], [address] Phone: [phone]   Fax:  [phone]  Physical Therapy Treatment  Patient Details  Name: Marcus Hendrix MRN: [id number] Date of Birth: 18-Mar-1941 Referring Provider: Harold Barban, MD   Encounter Date: 06/15/2018  PT End of Session - 06/15/18 1143    Visit Number  13    Number of Visits  25    Date for PT Re-Evaluation  07/20/18    Authorization Type  [employer] Medicare - 10th visit PN    PT Start Time  1055    PT Stop Time  1135    PT Time Calculation (min)  40 min    Equipment Utilized During Treatment  Gait belt    Activity Tolerance  Patient tolerated treatment well    Behavior During Therapy  [facility] for tasks assessed/performed       Past Medical History:  Diagnosis Date  . Cerebrovascular disease   . Diabetes mellitus   . Diabetes mellitus ([place])   . ED (erectile dysfunction)   . Glaucoma, left eye   . History of atherosclerotic cardiovascular disease   . History of myocardial infarction   . Hyperlipidemia   . Hypertension   . Inguinal hernia    Right  . Normocytic anemia   . Peripheral neuropathy   . PVD (peripheral vascular disease) ([place])   . Tubulovillous adenoma of colon     Past Surgical History:  Procedure Laterality Date  . AMPUTATION Right 12/12/2017   Procedure: AMPUTATION ABOVE KNEE RIGHT;  Surgeon: Serafina Mitchell, MD;  Location: [facility];  Service: Vascular;  Laterality: Right;  . CHOLECYSTECTOMY    . COLONOSCOPY [name] 07/22/2014   Procedure: COLONOSCOPY;  Surgeon: [name], MD;  [facility];  Service: Endoscopy;  Laterality: [name];  1235-moved to [place] notified pt  . CORONARY ARTERY BYPASS GRAFT  2002   x6  . INGUINAL HERNIA REPAIR  2004   Left  . LOWER EXTREMITY ANGIOGRAPHY [name] 12/08/2017   Procedure: LOWER EXTREMITY ANGIOGRAPHY;  Surgeon: Elam Dutch, MD;  Location: [facility];  Service: Cardiovascular;   Laterality: [name];  . PERIPHERAL VASCULAR INTERVENTION Bilateral 12/08/2017   Procedure: PERIPHERAL VASCULAR INTERVENTION;  Surgeon: Elam Dutch, MD;  Location: [facility];  Service: Cardiovascular;  Laterality: Bilateral;  [place]    There were no vitals filed for this visit.  Subjective Assessment - 06/15/18 1055    Subjective  No falls. No pain to report. He is wearing prosthesis 8 hrs /day.     Patient is accompained by:  Family member spouse, [employer] crew    Pertinent History  R TFA, CAD, cardiac arrest, CABG x2, DM, HTN, peripheral neuropathy, bradycardia, glaucoma, PVD     Limitations  Lifting;Standing;Walking;House hold activities    Patient Stated Goals  To walk with prosthesis around house, yard & store, fishing           [facility] Adult PT Treatment/Exercise - 06/15/18 0001      Ambulation/Gait   Ambulation/Gait  Yes    Ambulation/Gait Assistance  5: Supervision;4: Min guard    Ambulation/Gait Assistance Details  with RW around track with rest breaks due to fatigue between gait sets/barriers; cues for upright posture     Ambulation Distance (Feet)  220 Feet x1, 150 x1   Assistive device  Rolling walker    Gait Pattern  Step-to pattern;Decreased step length - right;Decreased step length - left;Decreased  stance time - right    Ambulation Surface  Level;Indoor    Stairs  Yes    Stairs Assistance  4: Min assist    Stairs Assistance Details (indicate cue type and reason)  cane/1 rail for support, First with L UE on cane the R UE on cane . Ascending with LLE and descending with prosthesis 1st.     Stair Management Technique  One rail Right;One rail Left;Forwards;With cane    Number of Stairs  4    Ramp  4: Min assist;5: Supervision    Ramp Details (indicate cue type and reason)  with RW and cues to lead with RLE and RW postioning    Curb  4: Min assist;5: Supervision    Curb Details (indicate cue type and reason)  needed verbal cues on proper technique      Balance    Balance Assessed  Yes      Static Standing Balance   Static Standing - Balance Support  Bilateral upper extremity supported;No upper extremity supported i    Static Standing - Level of Assistance  4: Min assist    Static Standing - Comment/# of Minutes  1 min no UE support; pt also participated in head turns and reaching 2" with intermittent UE support to maintain balance      Dynamic Standing Balance   Dynamic Standing - Balance Activities  --               PT Short Term Goals - 06/15/18 1056      PT SHORT TERM GOAL #1   Title  Patient reports prosthesis wear daily >8 hrs total /day without skin issues.     Baseline  06/15/18; goal met    Status  Achieved      PT SHORT TERM GOAL #2   Title  Patient stands without UE support for 1 minute with supervision, reaches 2" & looks to side with min guard.     Baseline  06/15/18; able to stand 1 min without UE support ,min-supervision needs UE support with reaching and looking to side.     Status  Partially Met      PT SHORT TERM GOAL #3   Title  Patient ambulates 250' with RW & prosthesis with supervision.     Baseline  06/21/18; ambulated 220' with min gaurd; pt fatigued and required rest breaks     Status  Not Met      PT SHORT TERM GOAL #4   Title  Patient ambulates 49' using cane & counter support with minA.     Status  On-going      PT SHORT TERM GOAL #5   Title  Patient negotiates stairs with 1 rail/cane, ramps & curbs with RW & prosthesis with supervision.      Baseline  06/15/18  ramps & curbs with RW min assist and stairs with cane min assist     Status  Not Met        PT Long Term Goals - 05/28/18 1458      PT LONG TERM GOAL #1   Title  Patient verbalizes & demonstrates proper prosthetic care to enable safe use of prosthesis. (All LTGs Target Date: 07/17/2018)    Time  12    Period  Weeks    Status  On-going    Target Date  07/17/18      PT LONG TERM GOAL #2   Title  Patient tolerates prosthesis wear >80% of  awake hours  without skin issues or limb pain to enable function throughout his day.     Time  12    Period  Weeks    Status  On-going    Target Date  07/17/18      PT LONG TERM GOAL #3   Title  Berg Balance >20/56 to reduce fall risk.     Time  12    Period  Weeks    Status  Revised    Target Date  07/17/18      PT LONG TERM GOAL #4   Title  Patient ambulates 300' with LRAD & Prosthesis outdoors including grass modified independent for community mobility.     Time  12    Period  Weeks    Status  Revised    Target Date  07/17/18      PT LONG TERM GOAL #5   Title  Patient negotiates ramps, curbs & stairs with LRAD & prosthesis modified independent to enable community access.     Time  12    Period  Weeks    Status  On-going    Target Date  07/17/18      PT LONG TERM GOAL #6   Title  Patient ambulates around furniture carrying cup of water with LRAD & prosthesis modified independent for household mobility.     Time  12    Period  Weeks    Status  On-going    Target Date  07/17/18            Plan - 06/15/18 1144    Clinical Impression Statement  Today's skilled session focused on assessing STG's. Pt has achieved and partially met STG's. Pt would benefit from continued PT to increase functional independence and work toward meeting unmet goals.    Rehab Potential  Good    PT Frequency  2x / week    PT Duration  12 weeks    PT Treatment/Interventions  ADLs/Self Care Home Management;DME Instruction;Gait training;Stair training;Canalith Repostioning;Functional mobility training;Therapeutic activities;Therapeutic exercise;Balance training;Neuromuscular re-education;Patient/family education;Prosthetic Training;Vestibular    PT Next Visit Plan  check STG #4 (ambulating with cane for 50 ft with support), Proximal hip strengthening/weight shifting, stairs (1 rail & SPC quad tip), ramps & curbs.    Consulted and Agree with Plan of Care  Patient       Patient will benefit from  skilled therapeutic intervention in order to improve the following deficits and impairments:  Abnormal gait, Decreased activity tolerance, Decreased balance, Decreased endurance, Decreased knowledge of use of DME, Decreased mobility, Impaired flexibility, Decreased strength, Postural dysfunction, Prosthetic Dependency  Visit Diagnosis: Muscle weakness (generalized)  Unsteadiness on feet  Other abnormalities of gait and mobility  Abnormal posture     Problem List Patient Active Problem List   Diagnosis Date Noted  . Pain of right lower extremity   . Type 2 diabetes mellitus with peripheral neuropathy (HCC)   . Benign essential HTN   . Coronary artery disease involving coronary bypass graft of native heart without angina pectoris   . Chronic combined systolic and diastolic congestive heart failure ([place])   . Leukocytosis   . Transaminitis   . Unilateral AKA, right ([place])   . Cardiac arrest ([place])   . Bradycardia 12/06/2017  . History of atherosclerotic cardiovascular disease 12/06/2017  . Diabetic foot infection ([place]) 11/28/2017  . Acute CHF (congestive heart failure) ([place]) 11/28/2017  . Rectal bleeding 10/04/2013  . Heme positive stool 10/04/2013  . Community acquired pneumonia 07/30/2013  .  DM type 2 (diabetes mellitus, type 2) ([place]) 07/30/2013  . HTN (hypertension), benign 07/30/2013  . CAROTID BRUITS, BILATERAL 07/16/2010  . MITRAL VALVE PROLAPSE 08/22/2009  . DM 07/22/2009  . Atherosclerotic cardiovascular disease 07/22/2009  . 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 ERECTILE DYSFUNCTION 05/27/2008  . INGUINAL HERNIA, RIGHT 10/19/2007  . ANEMIA, NORMOCYTIC 10/09/2007  . Dyslipidemia 12/19/2006  . PERIPHERAL NEUROPATHY 12/19/2006  . Unspecified glaucoma 12/19/2006  . Essential hypertension 12/19/2006  . MYOCARDIAL INFARCTION, HX OF 12/19/2006  . PERIPHERAL VASCULAR DISEASE 12/19/2006    Halina Andreas, [place] 06/15/2018, 3:43 PM  [facility] [address] [place], [place], [address] Phone: [phone]   Fax:  [phone]  Name: Marcus Hendrix MRN: [id number] Date of Birth: Nov 05, 1941

## 2018-06-17 ENCOUNTER — Ambulatory Visit: Payer: Medicare Other | Admitting: Physical Therapy

## 2018-06-17 ENCOUNTER — Encounter: Payer: Self-pay | Admitting: Physical Therapy

## 2018-06-17 DIAGNOSIS — M6281 Muscle weakness (generalized): Secondary | ICD-10-CM

## 2018-06-17 DIAGNOSIS — R2681 Unsteadiness on feet: Secondary | ICD-10-CM

## 2018-06-17 DIAGNOSIS — R2689 Other abnormalities of gait and mobility: Secondary | ICD-10-CM

## 2018-06-17 DIAGNOSIS — R293 Abnormal posture: Secondary | ICD-10-CM

## 2018-06-17 NOTE — Therapy (Signed)
[facility] [address] [place], [place], [address] Phone: [phone]   Fax:  [phone]  Physical Therapy Treatment  Patient Details  Name: Marcus Hendrix MRN: [id number] Date of Birth: 1941-01-07 Referring Provider: Harold Barban, MD   Encounter Date: 06/17/2018  PT End of Session - 06/17/18 1102    Visit Number  14    Number of Visits  25    Date for PT Re-Evaluation  07/20/18    Authorization Type  [employer] Medicare - 10th visit PN    PT Start Time  1015    PT Stop Time  1100    PT Time Calculation (min)  45 min    Equipment Utilized During Treatment  Gait belt    Activity Tolerance  Patient tolerated treatment well    Behavior During Therapy  [facility] for tasks assessed/performed       Past Medical History:  Diagnosis Date  . Cerebrovascular disease   . Diabetes mellitus   . Diabetes mellitus ([place])   . ED (erectile dysfunction)   . Glaucoma, left eye   . History of atherosclerotic cardiovascular disease   . History of myocardial infarction   . Hyperlipidemia   . Hypertension   . Inguinal hernia    Right  . Normocytic anemia   . Peripheral neuropathy   . PVD (peripheral vascular disease) ([place])   . Tubulovillous adenoma of colon     Past Surgical History:  Procedure Laterality Date  . AMPUTATION Right 12/12/2017   Procedure: AMPUTATION ABOVE KNEE RIGHT;  Surgeon: Serafina Mitchell, MD;  Location: [facility];  Service: Vascular;  Laterality: Right;  . CHOLECYSTECTOMY    . COLONOSCOPY [name] 07/22/2014   Procedure: COLONOSCOPY;  Surgeon: [name], MD;  [facility];  Service: Endoscopy;  Laterality: [name];  1235-moved to [place] notified pt  . CORONARY ARTERY BYPASS GRAFT  2002   x6  . INGUINAL HERNIA REPAIR  2004   Left  . LOWER EXTREMITY ANGIOGRAPHY [name] 12/08/2017   Procedure: LOWER EXTREMITY ANGIOGRAPHY;  Surgeon: Elam Dutch, MD;  Location: [place] CV LAB;  Service: Cardiovascular;   Laterality: [name];  . PERIPHERAL VASCULAR INTERVENTION Bilateral 12/08/2017   Procedure: PERIPHERAL VASCULAR INTERVENTION;  Surgeon: Elam Dutch, MD;  Location: [place] CV [facility];  Service: Cardiovascular;  Laterality: Bilateral;  [place]    There were no vitals filed for this visit.  Subjective Assessment - 06/17/18 1017    Subjective  No falls. No pain to report. Practises walking at home with [facility] by counter. Pt wears prosthesis 9am-5 pm every day.    Patient is accompained by:  Family member spouse, Marcus Hendrix    Pertinent History  R TFA, CAD, cardiac arrest, CABG x2, DM, HTN, peripheral neuropathy, bradycardia, glaucoma, PVD     Limitations  Lifting;Standing;Walking;House hold activities    Patient Stated Goals  To walk with prosthesis around house, yard & store, fishing                       [facility] Adult PT Treatment/Exercise - 06/17/18 0001      Transfers   Transfers  Stand to Sit;Sit to [employer]    Sit to Stand  5: Supervision    Stand to Sit  5: Supervision    Stand to Sit Details  controlled descent    Squat Pivot Transfers  5: Supervision    Squat Pivot Transfer Details (indicate  cue type and reason)  increased time needed to manage RW and prosthesis      Ambulation/Gait   Ambulation/Gait  Yes    Ambulation/Gait Assistance  4: Min guard    Ambulation/Gait Assistance Details  trainig for continuous gait pattern with walker. Gait with SPC (quad tip) with counter for support ; cues for posture and weight shifting.     Ambulation Distance (Feet)  220 Feet 100 + 50    Assistive device  Rolling walker    Gait Pattern  Step-to pattern;Decreased step length - right;Decreased step length - left;Decreased stance time - right    Ambulation Surface  Level;Indoor;Outdoor;Paved      Prosthetics   Prosthetic Care Comments   encourage pt to increase prosthesis wear towards >80% for safety/ decrease fall risk    Current prosthetic wear tolerance  (days/week)   daily    Current prosthetic wear tolerance (#hours/day)   8 hrs 9am-6pm    Residual limb condition   intact with no issues per pt    Education Provided  Proper wear schedule/adjustment    Person(s) Educated  Patient    Education Method  Explanation    Education Method  Verbalized understanding          Balance Exercises - 06/17/18 1102      Balance Exercises: Standing   Standing Eyes Opened  Wide ([place]) working on static standing balance with decreased UE support and with reaching with alt UE.       PT Education - 06/17/18 1254    Education provided  Yes    Education Details  Discussed safety and reason to progress wear time on prosthesis.  Training to walk into therapy vs. w/c.    Person(s) Educated  Other (comment) friend    Methods  Explanation;Demonstration;Verbal cues    Comprehension  Returned demonstration;Verbal cues required;Verbalized understanding;Need further instruction       PT Short Term Goals - 06/17/18 1250      PT SHORT TERM GOAL #1   Title  Patient reports prosthesis wear daily >8 hrs total /day without skin issues.     Baseline  06/15/18; goal met    Status  Achieved      PT SHORT TERM GOAL #2   Title  Patient stands without UE support for 1 minute with supervision, reaches 2" & looks to side with min guard.     Baseline  06/15/18; able to stand 1 min without UE support ,min-supervision needs UE support with reaching and looking to side.     Status  Partially Met      PT SHORT TERM GOAL #3   Title  Patient ambulates 250' with RW & prosthesis with supervision.     Baseline  06/21/18; ambulated 220' with min gaurd; pt fatigued and required rest breaks     Status  Not Met      PT SHORT TERM GOAL #4   Title  Patient ambulates 37' using cane & counter support with minA.     Baseline  Met: 06/17/18.    Status  Achieved      PT SHORT TERM GOAL #5   Title  Patient negotiates stairs with 1 rail/cane, ramps & curbs with RW & prosthesis with  supervision.      Baseline  06/15/18  ramps & curbs with RW min assist and stairs with cane min assist     Status  Not Met        PT Long Term Goals -  05/28/18 1458      PT LONG TERM GOAL #1   Title  Patient verbalizes & demonstrates proper prosthetic care to enable safe use of prosthesis. (All LTGs Target Date: 07/17/2018)    Time  12    Period  Weeks    Status  On-going    Target Date  07/17/18      PT LONG TERM GOAL #2   Title  Patient tolerates prosthesis wear >80% of awake hours without skin issues or limb pain to enable function throughout his day.     Time  12    Period  Weeks    Status  On-going    Target Date  07/17/18      PT LONG TERM GOAL #3   Title  Berg Balance >20/56 to reduce fall risk.     Time  12    Period  Weeks    Status  Revised    Target Date  07/17/18      PT LONG TERM GOAL #4   Title  Patient ambulates 300' with LRAD & Prosthesis outdoors including grass modified independent for community mobility.     Time  12    Period  Weeks    Status  Revised    Target Date  07/17/18      PT LONG TERM GOAL #5   Title  Patient negotiates ramps, curbs & stairs with LRAD & prosthesis modified independent to enable community access.     Time  12    Period  Weeks    Status  On-going    Target Date  07/17/18      PT LONG TERM GOAL #6   Title  Patient ambulates around furniture carrying cup of water with LRAD & prosthesis modified independent for household mobility.     Time  12    Period  Weeks    Status  On-going    Target Date  07/17/18            Plan - 06/17/18 1249    Clinical Impression Statement  Pt met STG # 4 performing gait with SPC and counter at min guard level for 50 ft.  Encouraged pt to practise walking into therapy with walker with supervision.  Gait training with pt ambulating out to the lobby and front entrance.  Gait training working on progressing to a more continuous gait pattern; pt required min guard for balance.                                                     Rehab Potential  Good    PT Frequency  2x / week    PT Duration  12 weeks    PT Treatment/Interventions  ADLs/Self Care Home Management;DME Instruction;Gait training;Stair training;Canalith Repostioning;Functional mobility training;Therapeutic activities;Therapeutic exercise;Balance training;Neuromuscular re-education;Patient/family education;Prosthetic Training;Vestibular    PT Next Visit Plan  , Proximal hip strengthening/weight shifting, stairs (1 rail & SPC quad tip), ramps & curbs.    Consulted and Agree with Plan of Care  Patient       Patient will benefit from skilled therapeutic intervention in order to improve the following deficits and impairments:  Abnormal gait, Decreased activity tolerance, Decreased balance, Decreased endurance, Decreased knowledge of use of DME, Decreased mobility, Impaired flexibility, Decreased strength, Postural dysfunction, Prosthetic Dependency  Visit Diagnosis: Muscle weakness (  generalized)  Unsteadiness on feet  Other abnormalities of gait and mobility  Abnormal posture     Problem List Patient Active Problem List   Diagnosis Date Noted  . Pain of right lower extremity   . Type 2 diabetes mellitus with peripheral neuropathy (HCC)   . Benign essential HTN   . Coronary artery disease involving coronary bypass graft of native heart without angina pectoris   . Chronic combined systolic and diastolic congestive heart failure ([place])   . Leukocytosis   . Transaminitis   . Unilateral AKA, right ([place])   . Cardiac arrest ([place])   . Bradycardia 12/06/2017  . History of atherosclerotic cardiovascular disease 12/06/2017  . Diabetic foot infection ([place]) 11/28/2017  . Acute CHF (congestive heart failure) ([place]) 11/28/2017  . Rectal bleeding 10/04/2013  . Heme positive stool 10/04/2013  . Community acquired pneumonia 07/30/2013  . DM type 2 (diabetes mellitus, type 2) ([place]) 07/30/2013  . HTN (hypertension), benign  07/30/2013  . CAROTID BRUITS, BILATERAL 07/16/2010  . MITRAL VALVE PROLAPSE 08/22/2009  . DM 07/22/2009  . Atherosclerotic cardiovascular disease 07/22/2009  . 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 ERECTILE DYSFUNCTION 05/27/2008  . INGUINAL HERNIA, RIGHT 10/19/2007  . ANEMIA, NORMOCYTIC 10/09/2007  . Dyslipidemia 12/19/2006  . PERIPHERAL NEUROPATHY 12/19/2006  . Unspecified glaucoma 12/19/2006  . Essential hypertension 12/19/2006  . MYOCARDIAL INFARCTION, HX OF 12/19/2006  . PERIPHERAL VASCULAR DISEASE 12/19/2006    Bjorn Loser, PTA  06/17/18, 1:00 PM [facility] [address] [place], [place], [address] Phone: [phone]   Fax:  [phone]  Name: GODFREY TRITSCHLER MRN: [id number] Date of Birth: 1941/01/13

## 2018-06-22 ENCOUNTER — Ambulatory Visit: Payer: Medicare Other | Admitting: Physical Therapy

## 2018-06-22 ENCOUNTER — Encounter: Payer: Self-pay | Admitting: Physical Therapy

## 2018-06-22 DIAGNOSIS — R2681 Unsteadiness on feet: Secondary | ICD-10-CM

## 2018-06-22 DIAGNOSIS — M6281 Muscle weakness (generalized): Secondary | ICD-10-CM | POA: Diagnosis not present

## 2018-06-22 DIAGNOSIS — R293 Abnormal posture: Secondary | ICD-10-CM

## 2018-06-22 DIAGNOSIS — R2689 Other abnormalities of gait and mobility: Secondary | ICD-10-CM

## 2018-06-22 NOTE — Therapy (Signed)
[facility] [name] [place], [place], [address] Phone: [phone]   Fax:  [phone]  Physical Therapy Treatment  Patient Details  Name: Marcus Hendrix MRN: [id number] Date of Birth: 04-06-1941 Referring Provider: Harold Barban, MD   Encounter Date: 06/22/2018  PT End of Session - 06/22/18 1057    Visit Number  15    Number of Visits  25    Date for PT Re-Evaluation  07/20/18    Authorization Type  [employer] Medicare - 10th visit PN    PT Start Time  1015    PT Stop Time  1055    PT Time Calculation (min)  40 min    Equipment Utilized During Treatment  Gait belt    Activity Tolerance  Patient tolerated treatment well    Behavior During Therapy  [facility] for tasks assessed/performed       Past Medical History:  Diagnosis Date  . Cerebrovascular disease   . Diabetes mellitus   . Diabetes mellitus ([place])   . ED (erectile dysfunction)   . Glaucoma, left eye   . History of atherosclerotic cardiovascular disease   . History of myocardial infarction   . Hyperlipidemia   . Hypertension   . Inguinal hernia    Right  . Normocytic anemia   . Peripheral neuropathy   . PVD (peripheral vascular disease) ([place])   . Tubulovillous adenoma of colon     Past Surgical History:  Procedure Laterality Date  . AMPUTATION Right 12/12/2017   Procedure: AMPUTATION ABOVE KNEE RIGHT;  Surgeon: Serafina Mitchell, MD;  Location: [place];  Service: Vascular;  Laterality: Right;  . CHOLECYSTECTOMY    . COLONOSCOPY [name] 07/22/2014   Procedure: COLONOSCOPY;  Surgeon: [name], MD;  [facility];  Service: Endoscopy;  Laterality: [name];  1235-moved to [place] notified pt  . CORONARY ARTERY BYPASS GRAFT  2002   x6  . INGUINAL HERNIA REPAIR  2004   Left  . LOWER EXTREMITY ANGIOGRAPHY [name] 12/08/2017   Procedure: LOWER EXTREMITY ANGIOGRAPHY;  Surgeon: Elam Dutch, MD;  Location: [place] CV LAB;  Service: Cardiovascular;   Laterality: [name];  . PERIPHERAL VASCULAR INTERVENTION Bilateral 12/08/2017   Procedure: PERIPHERAL VASCULAR INTERVENTION;  Surgeon: Elam Dutch, MD;  Location: [place] CV LAB;  Service: Cardiovascular;  Laterality: Bilateral;  [place]    There were no vitals filed for this visit.  Subjective Assessment - 06/22/18 1015    Subjective  No falls. He is wearing prosthesis all awake hours. He stepped in hole in yard and lost balance but was able to use RW to recover.     Patient is accompained by:  Family member spouse, [name]    Pertinent History  R TFA, CAD, cardiac arrest, CABG x2, DM, HTN, peripheral neuropathy, bradycardia, glaucoma, PVD     Limitations  Lifting;Standing;Walking;House hold activities    Patient Stated Goals  To walk with prosthesis around house, yard & store, fishing    Currently in Pain?  No/denies                       [facility] Adult PT Treatment/Exercise - 06/22/18 1015      Transfers   Transfers  Stand to Sit;Sit to [employer]    Sit to Stand  5: Supervision;4: Min assist;With upper extremity assist;From chair/3-in-1 from chairs without armrests using UEs, working on [employer] to [employer]  Visual cues/gestures for sequencing;Visual cues for safe use of DME/AE;Verbal cues for technique    Sit to Stand Details (indicate cue type and reason)  working on stabilization without touching RW but positioned close enough to use if needed; improved but uses back of legs against chair    Stand to Sit  5: Supervision;With upper extremity assist;With armrests;To chair/3-in-1 to chairs with & without armrests    Stand to Sit Details (indicate cue type and reason)  Visual cues for safe use of DME/AE;Verbal cues for sequencing;Verbal cues for technique;Verbal cues for gait pattern    Stand to Sit Details  cues on technique to chairs without armrests    Squat Pivot Transfers  5: Supervision      Ambulation/Gait   Ambulation/Gait  Yes     Ambulation/Gait Assistance  3: Mod assist;5: Supervision modA cane & supervision RW    Ambulation/Gait Assistance Details  verbal & tactile cues on posture, sequence and wt shift over prosthesis in stance.     Ambulation Distance (Feet)  20 Feet 20' X 6 cane/hand hold, 100' X 2 RW    Assistive device  Rolling walker;Straight cane;1 person hand held assist;Prosthesis    Gait Pattern  Decreased step length - left;Decreased stance time - right;Step-through pattern;Decreased stride length;Right hip hike;Abducted- right    Ambulation Surface  Indoor;Level      Neuro Re-ed    Neuro Re-ed Details   standing balance with intermittent support: sit to stand without touching RW as goal,  reaching to cone 10" high anteriorly and right/left at waist height,  looking over shoulders with hands hovering over support, moving cones right to/from left and managing pants one hand at time then placing back of legs against "toilet" /sturdy chair for 2 handed task like bucling pants.       Prosthetics   Prosthetic Care Comments   encourage pt to increase prosthesis wear towards >80% for decrease fall risk    Current prosthetic wear tolerance (days/week)   daily    Current prosthetic wear tolerance (#hours/day)   8 hrs 9am-6pm    Residual limb condition   intact with no issues per pt    Education Provided  Proper wear schedule/adjustment    Person(s) Educated  Patient;Spouse    Education Method  Explanation;Verbal cues    Education Method  Verbalized understanding             PT Education - 06/22/18 1045    Education provided  Yes    Education Details  standing balance in safe environment (RW or sink for support as needed) looking over shoulders, standing without touching RW, moving cones side to side    Person(s) Educated  Patient;Spouse    Methods  Explanation;Demonstration;Verbal cues    Comprehension  Verbalized understanding       PT Short Term Goals - 06/17/18 1250      PT SHORT TERM GOAL #1    Title  Patient reports prosthesis wear daily >8 hrs total /day without skin issues.     Baseline  06/15/18; goal met    Status  Achieved      PT SHORT TERM GOAL #2   Title  Patient stands without UE support for 1 minute with supervision, reaches 2" & looks to side with min guard.     Baseline  06/15/18; able to stand 1 min without UE support ,min-supervision needs UE support with reaching and looking to side.     Status  Partially Met  PT SHORT TERM GOAL #3   Title  Patient ambulates 250' with RW & prosthesis with supervision.     Baseline  06/21/18; ambulated 220' with min gaurd; pt fatigued and required rest breaks     Status  Not Met      PT SHORT TERM GOAL #4   Title  Patient ambulates 91' using cane & counter support with minA.     Baseline  Met: 06/17/18.    Status  Achieved      PT SHORT TERM GOAL #5   Title  Patient negotiates stairs with 1 rail/cane, ramps & curbs with RW & prosthesis with supervision.      Baseline  06/15/18  ramps & curbs with RW min assist and stairs with cane min assist     Status  Not Met        PT Long Term Goals - 05/28/18 1458      PT LONG TERM GOAL #1   Title  Patient verbalizes & demonstrates proper prosthetic care to enable safe use of prosthesis. (All LTGs Target Date: 07/17/2018)    Time  12    Period  Weeks    Status  On-going    Target Date  07/17/18      PT LONG TERM GOAL #2   Title  Patient tolerates prosthesis wear >80% of awake hours without skin issues or limb pain to enable function throughout his day.     Time  12    Period  Weeks    Status  On-going    Target Date  07/17/18      PT LONG TERM GOAL #3   Title  Berg Balance >20/56 to reduce fall risk.     Time  12    Period  Weeks    Status  Revised    Target Date  07/17/18      PT LONG TERM GOAL #4   Title  Patient ambulates 300' with LRAD & Prosthesis outdoors including grass modified independent for community mobility.     Time  12    Period  Weeks    Status   Revised    Target Date  07/17/18      PT LONG TERM GOAL #5   Title  Patient negotiates ramps, curbs & stairs with LRAD & prosthesis modified independent to enable community access.     Time  12    Period  Weeks    Status  On-going    Target Date  07/17/18      PT LONG TERM GOAL #6   Title  Patient ambulates around furniture carrying cup of water with LRAD & prosthesis modified independent for household mobility.     Time  12    Period  Weeks    Status  On-going    Target Date  07/17/18            Plan - 06/22/18 1249    Clinical Impression Statement  Patient appears to understand standing balance HEP with RW or sink near for safety. He improved standing balance for 2 handed task by placing back of legs against sturdy chair or "toilet"  Pt improved gait with cane with hand hold but sitting into chairs without armrests was difficult.     Rehab Potential  Good    PT Frequency  2x / week    PT Duration  12 weeks    PT Treatment/Interventions  ADLs/Self Care Home Management;DME Instruction;Gait training;Stair training;Canalith Repostioning;Functional mobility training;Therapeutic  activities;Therapeutic exercise;Balance training;Neuromuscular re-education;Patient/family education;Prosthetic Training;Vestibular    PT Next Visit Plan  standing balance & prosthetic gait with cane, Proximal hip strengthening/weight shifting, stairs (1 rail & SPC quad tip), ramps & curbs.    Consulted and Agree with Plan of Care  Patient       Patient will benefit from skilled therapeutic intervention in order to improve the following deficits and impairments:  Abnormal gait, Decreased activity tolerance, Decreased balance, Decreased endurance, Decreased knowledge of use of DME, Decreased mobility, Impaired flexibility, Decreased strength, Postural dysfunction, Prosthetic Dependency  Visit Diagnosis: Muscle weakness (generalized)  Unsteadiness on feet  Other abnormalities of gait and  mobility  Abnormal posture     Problem List Patient Active Problem List   Diagnosis Date Noted  . Pain of right lower extremity   . Type 2 diabetes mellitus with peripheral neuropathy (HCC)   . Benign essential HTN   . Coronary artery disease involving coronary bypass graft of native heart without angina pectoris   . Chronic combined systolic and diastolic congestive heart failure ([place])   . Leukocytosis   . Transaminitis   . Unilateral AKA, right ([place])   . Cardiac arrest ([place])   . Bradycardia 12/06/2017  . History of atherosclerotic cardiovascular disease 12/06/2017  . Diabetic foot infection ([place]) 11/28/2017  . Acute CHF (congestive heart failure) ([place]) 11/28/2017  . Rectal bleeding 10/04/2013  . Heme positive stool 10/04/2013  . Community acquired pneumonia 07/30/2013  . DM type 2 (diabetes mellitus, type 2) ([place]) 07/30/2013  . HTN (hypertension), benign 07/30/2013  . CAROTID BRUITS, BILATERAL 07/16/2010  . MITRAL VALVE PROLAPSE 08/22/2009  . DM 07/22/2009  . Atherosclerotic cardiovascular disease 07/22/2009  . 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 ERECTILE DYSFUNCTION 05/27/2008  . INGUINAL HERNIA, RIGHT 10/19/2007  . ANEMIA, NORMOCYTIC 10/09/2007  . Dyslipidemia 12/19/2006  . PERIPHERAL NEUROPATHY 12/19/2006  . Unspecified glaucoma 12/19/2006  . Essential hypertension 12/19/2006  . MYOCARDIAL INFARCTION, HX OF 12/19/2006  . PERIPHERAL VASCULAR DISEASE 12/19/2006    Jamey Reas PT, DPT 06/22/2018, 12:52 PM  [facility] [address] [place], [place], [address] Phone: [phone]   Fax:  [phone]  Name: ABDON PETROSKY MRN: [id number] Date of Birth: 15-Feb-1941

## 2018-06-25 ENCOUNTER — Ambulatory Visit: Payer: Medicare Other | Admitting: Physical Therapy

## 2018-06-25 ENCOUNTER — Encounter: Payer: Self-pay | Admitting: Physical Therapy

## 2018-06-25 DIAGNOSIS — R2681 Unsteadiness on feet: Secondary | ICD-10-CM

## 2018-06-25 DIAGNOSIS — R293 Abnormal posture: Secondary | ICD-10-CM

## 2018-06-25 DIAGNOSIS — M6281 Muscle weakness (generalized): Secondary | ICD-10-CM

## 2018-06-25 DIAGNOSIS — R2689 Other abnormalities of gait and mobility: Secondary | ICD-10-CM

## 2018-06-25 NOTE — Therapy (Signed)
[facility] [address] [place], [place], [address] Phone: [phone]   Fax:  [phone]  Physical Therapy Treatment  Patient Details  Name: Marcus Hendrix MRN: [id number] Date of Birth: 03-17-1941 Referring Provider: Harold Barban, MD   Encounter Date: 06/25/2018  PT End of Session - 06/25/18 1140    Visit Number  16    Number of Visits  25    Date for PT Re-Evaluation  07/20/18    Authorization Type  [employer] Medicare - 10th visit PN    PT Start Time  1017    PT Stop Time  1058    PT Time Calculation (min)  41 min    Equipment Utilized During Treatment  Gait belt    Activity Tolerance  Patient tolerated treatment well    Behavior During Therapy  WFL for tasks assessed/performed       Past Medical History:  Diagnosis Date  . Cerebrovascular disease   . Diabetes mellitus   . Diabetes mellitus ([place])   . ED (erectile dysfunction)   . Glaucoma, left eye   . History of atherosclerotic cardiovascular disease   . History of myocardial infarction   . Hyperlipidemia   . Hypertension   . Inguinal hernia    Right  . Normocytic anemia   . Peripheral neuropathy   . PVD (peripheral vascular disease) ([place])   . Tubulovillous adenoma of colon     Past Surgical History:  Procedure Laterality Date  . AMPUTATION Right 12/12/2017   Procedure: AMPUTATION ABOVE KNEE RIGHT;  Surgeon: Serafina Mitchell, MD;  Location: [facility];  Service: Vascular;  Laterality: Right;  . CHOLECYSTECTOMY    . COLONOSCOPY [name] 07/22/2014   Procedure: COLONOSCOPY;  Surgeon: [name], MD;  [facility];  Service: Endoscopy;  Laterality: [name];  1235-moved to [place] notified pt  . CORONARY ARTERY BYPASS GRAFT  2002   x6  . INGUINAL HERNIA REPAIR  2004   Left  . LOWER EXTREMITY ANGIOGRAPHY [name] 12/08/2017   Procedure: LOWER EXTREMITY ANGIOGRAPHY;  Surgeon: Elam Dutch, MD;  Location: [facility];  Service:  Cardiovascular;  Laterality: [name];  . PERIPHERAL VASCULAR INTERVENTION Bilateral 12/08/2017   Procedure: PERIPHERAL VASCULAR INTERVENTION;  Surgeon: Elam Dutch, MD;  Location: [facility];  Service: Cardiovascular;  Laterality: Bilateral;  [place]    There were no vitals filed for this visit.  Subjective Assessment - 06/25/18 1021    Subjective  No falls. He is wearing prosthesis all awake hours. No new complaints.    Patient is accompained by:  Family member spouse, [name]    Pertinent History  R TFA, CAD, cardiac arrest, CABG x2, DM, HTN, peripheral neuropathy, bradycardia, glaucoma, PVD     Limitations  Lifting;Standing;Walking;House hold activities    Patient Stated Goals  To walk with prosthesis around house, yard & store, fishing    Currently in Pain?  No/denies          Prosthetics Assessment - 06/25/18 1150      Prosthetics   Current prosthetic wear tolerance (days/week)   daily    Current prosthetic wear tolerance (#hours/day)   8 hours    Residual limb condition   intact per pt report                  [facility] Adult PT Treatment/Exercise - 06/25/18 1150      Neuro Re-ed    Neuro Re-ed Details  pt instructed in parallel bars for safety for Weight shifting exs including: ant<>post, side<>side x 10 each, needing intermitent UE support for balance;  foot taps on 4'' step to increase wb on prosthesis; pt needing verbal and tactile cues with demo for prper technique to increase stance time.       Knee/Hip Exercises: Seated   Abduction/Adduction   AROM;Strengthening;Both;10 reps;1 set    Abd/Adduction Limitations  using red tband, stabilizing prosthesis       Knee/Hip Exercises: Supine   Bridges  AROM;Strengthening;Both;10 reps    Bridges Limitations  requiring verbal and tactile cues to lift hips    Other Supine Knee/Hip Exercises  hip fall outs with red tband x 10 reps x 1 set with 5 sec hold; stabilization of prosthesis and vc for proper technique;  also done in sitting          PT Short Term Goals - 06/17/18 1250      PT SHORT TERM GOAL #1   Title  Patient reports prosthesis wear daily >8 hrs total /day without skin issues.     Baseline  06/15/18; goal met    Status  Achieved      PT SHORT TERM GOAL #2   Title  Patient stands without UE support for 1 minute with supervision, reaches 2" & looks to side with min guard.     Baseline  06/15/18; able to stand 1 min without UE support ,min-supervision needs UE support with reaching and looking to side.     Status  Partially Met      PT SHORT TERM GOAL #3   Title  Patient ambulates 250' with RW & prosthesis with supervision.     Baseline  06/21/18; ambulated 220' with min gaurd; pt fatigued and required rest breaks     Status  Not Met      PT SHORT TERM GOAL #4   Title  Patient ambulates 25' using cane & counter support with minA.     Baseline  Met: 06/17/18.    Status  Achieved      PT SHORT TERM GOAL #5   Title  Patient negotiates stairs with 1 rail/cane, ramps & curbs with RW & prosthesis with supervision.      Baseline  06/15/18  ramps & curbs with RW min assist and stairs with cane min assist     Status  Not Met        PT Long Term Goals - 05/28/18 1458      PT LONG TERM GOAL #1   Title  Patient verbalizes & demonstrates proper prosthetic care to enable safe use of prosthesis. (All LTGs Target Date: 07/17/2018)    Time  12    Period  Weeks    Status  On-going    Target Date  07/17/18      PT LONG TERM GOAL #2   Title  Patient tolerates prosthesis wear >80% of awake hours without skin issues or limb pain to enable function throughout his day.     Time  12    Period  Weeks    Status  On-going    Target Date  07/17/18      PT LONG TERM GOAL #3   Title  Berg Balance >20/56 to reduce fall risk.     Time  12    Period  Weeks    Status  Revised    Target Date  07/17/18      PT LONG TERM GOAL #4  Title  Patient ambulates 300' with LRAD & Prosthesis outdoors  including grass modified independent for community mobility.     Time  12    Period  Weeks    Status  Revised    Target Date  07/17/18      PT LONG TERM GOAL #5   Title  Patient negotiates ramps, curbs & stairs with LRAD & prosthesis modified independent to enable community access.     Time  12    Period  Weeks    Status  On-going    Target Date  07/17/18      PT LONG TERM GOAL #6   Title  Patient ambulates around furniture carrying cup of water with LRAD & prosthesis modified independent for household mobility.     Time  12    Period  Weeks    Status  On-going    Target Date  07/17/18         Plan - 06/25/18 1141    Clinical Impression Statement  Skilled session focused on balance and weight shifting exercise to increse weight bearing on prosthesis and an emphasis on hip strengthening exerices. Paitent is progressing well and would benefit form continued PT in order to improve functional independence and to meet unmet goals.    Rehab Potential  Good    PT Frequency  2x / week    PT Duration  12 weeks    PT Treatment/Interventions  ADLs/Self Care Home Management;DME Instruction;Gait training;Stair training;Canalith Repostioning;Functional mobility training;Therapeutic activities;Therapeutic exercise;Balance training;Neuromuscular re-education;Patient/family education;Prosthetic Training;Vestibular    PT Next Visit Plan  Emphasize Proximal hip strengthening/weight shifting. Standing balance & prosthetic gait with cane, stairs (1 rail & SPC quad tip), ramps & curbs.    Consulted and Agree with Plan of Care  Patient       Patient will benefit from skilled therapeutic intervention in order to improve the following deficits and impairments:  Abnormal gait, Decreased activity tolerance, Decreased balance, Decreased endurance, Decreased knowledge of use of DME, Decreased mobility, Impaired flexibility, Decreased strength, Postural dysfunction, Prosthetic Dependency  Visit  Diagnosis: Muscle weakness (generalized)  Unsteadiness on feet  Other abnormalities of gait and mobility  Abnormal posture     Problem List Patient Active Problem List   Diagnosis Date Noted  . Pain of right lower extremity   . Type 2 diabetes mellitus with peripheral neuropathy (HCC)   . Benign essential HTN   . Coronary artery disease involving coronary bypass graft of native heart without angina pectoris   . Chronic combined systolic and diastolic congestive heart failure ([place])   . Leukocytosis   . Transaminitis   . Unilateral AKA, right ([place])   . Cardiac arrest ([place])   . Bradycardia 12/06/2017  . History of atherosclerotic cardiovascular disease 12/06/2017  . Diabetic foot infection ([place]) 11/28/2017  . Acute CHF (congestive heart failure) ([place]) 11/28/2017  . Rectal bleeding 10/04/2013  . Heme positive stool 10/04/2013  . Community acquired pneumonia 07/30/2013  . DM type 2 (diabetes mellitus, type 2) ([place]) 07/30/2013  . HTN (hypertension), benign 07/30/2013  . CAROTID BRUITS, BILATERAL 07/16/2010  . MITRAL VALVE PROLAPSE 08/22/2009  . DM 07/22/2009  . Atherosclerotic cardiovascular disease 07/22/2009  . 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 ERECTILE DYSFUNCTION 05/27/2008  . INGUINAL HERNIA, RIGHT 10/19/2007  . ANEMIA, NORMOCYTIC 10/09/2007  . Dyslipidemia 12/19/2006  . PERIPHERAL NEUROPATHY 12/19/2006  . Unspecified glaucoma 12/19/2006  . Essential hypertension 12/19/2006  . MYOCARDIAL INFARCTION, HX OF 12/19/2006  . PERIPHERAL  VASCULAR DISEASE 12/19/2006   Halina Andreas, [place]  06/25/2018, 2:52 PM  [facility] [address] [place], [place], [address] Phone: [phone]   Fax:  [phone]  Name: Marcus Hendrix MRN: [id number] Date of Birth: January 08, 1941

## 2018-06-29 ENCOUNTER — Ambulatory Visit: Payer: Medicare Other | Admitting: Physical Therapy

## 2018-06-29 ENCOUNTER — Encounter: Payer: Self-pay | Admitting: Physical Therapy

## 2018-06-29 DIAGNOSIS — R293 Abnormal posture: Secondary | ICD-10-CM

## 2018-06-29 DIAGNOSIS — R2689 Other abnormalities of gait and mobility: Secondary | ICD-10-CM

## 2018-06-29 DIAGNOSIS — M6281 Muscle weakness (generalized): Secondary | ICD-10-CM | POA: Diagnosis not present

## 2018-06-29 DIAGNOSIS — R2681 Unsteadiness on feet: Secondary | ICD-10-CM

## 2018-06-29 NOTE — Therapy (Signed)
[facility] [address] [place], [place], [address] Phone: [phone]   Fax:  [phone]  Physical Therapy Treatment  Patient Details  Name: Marcus Hendrix MRN: [id number] Date of Birth: 1941/09/01 Referring Provider: Harold Barban, MD   Encounter Date: 06/29/2018  PT End of Session - 06/29/18 1059    Visit Number  17    Number of Visits  25    Date for PT Re-Evaluation  07/20/18    Authorization Type  [employer] Medicare - 10th visit PN    PT Start Time  1017    PT Stop Time  1059    PT Time Calculation (min)  42 min    Equipment Utilized During Treatment  Gait belt    Activity Tolerance  Patient tolerated treatment well    Behavior During Therapy  WFL for tasks assessed/performed       Past Medical History:  Diagnosis Date  . Cerebrovascular disease   . Diabetes mellitus   . Diabetes mellitus ([place])   . ED (erectile dysfunction)   . Glaucoma, left eye   . History of atherosclerotic cardiovascular disease   . History of myocardial infarction   . Hyperlipidemia   . Hypertension   . Inguinal hernia    Right  . Normocytic anemia   . Peripheral neuropathy   . PVD (peripheral vascular disease) ([place])   . Tubulovillous adenoma of colon     Past Surgical History:  Procedure Laterality Date  . AMPUTATION Right 12/12/2017   Procedure: AMPUTATION ABOVE KNEE RIGHT;  Surgeon: Serafina Mitchell, MD;  Location: [facility];  Service: Vascular;  Laterality: Right;  . CHOLECYSTECTOMY    . COLONOSCOPY [name] 07/22/2014   Procedure: COLONOSCOPY;  Surgeon: [name], MD;  [facility];  Service: Endoscopy;  Laterality: [name];  1235-moved to [place] notified pt  . CORONARY ARTERY BYPASS GRAFT  2002   x6  . INGUINAL HERNIA REPAIR  2004   Left  . LOWER EXTREMITY ANGIOGRAPHY [name] 12/08/2017   Procedure: LOWER EXTREMITY ANGIOGRAPHY;  Surgeon: Elam Dutch, MD;  Location: [facility];  Service:  Cardiovascular;  Laterality: [name];  . PERIPHERAL VASCULAR INTERVENTION Bilateral 12/08/2017   Procedure: PERIPHERAL VASCULAR INTERVENTION;  Surgeon: Elam Dutch, MD;  Location: [facility];  Service: Cardiovascular;  Laterality: Bilateral;  [place]    There were no vitals filed for this visit.  Subjective Assessment - 06/29/18 1022    Subjective  He used cane in house some near counter. His leg itches when removing prosthesis. He is wearing prosthesis all awake hours.     Patient is accompained by:  Family member spouse, [name]    Pertinent History  R TFA, CAD, cardiac arrest, CABG x2, DM, HTN, peripheral neuropathy, bradycardia, glaucoma, PVD     Limitations  Lifting;Standing;Walking;House hold activities    Patient Stated Goals  To walk with prosthesis around house, yard & store, fishing    Currently in Pain?  No/denies                       [facility] Adult PT Treatment/Exercise - 06/29/18 1015      Transfers   Transfers  Stand to Sit;Sit to [employer] Transfers    Sit to Stand  5: Supervision;4: Min assist;With upper extremity assist;From chair/3-in-1 working on cane only to stabilize,     Sit to Stand Details  Visual cues/gestures for sequencing;Visual cues for  safe use of DME/AE;Verbal cues for technique    Stand to Sit  5: Supervision;With upper extremity assist;With armrests;To chair/3-in-1 working on cane only for stabilization    Stand to Sit Details (indicate cue type and reason)  Visual cues for safe use of DME/AE;Verbal cues for sequencing;Verbal cues for technique;Verbal cues for gait pattern      Ambulation/Gait   Ambulation/Gait  Yes    Ambulation/Gait Assistance  3: Mod assist;5: Supervision ModA cane, Supervision RW    Ambulation/Gait Assistance Details  verbal & tactile cues on upright posture, wt shift over prosthesis in stance and prosthetic control    Ambulation Distance (Feet)  35 Feet 35' X 2 cane/hand hold, 100' X 2 RW (cues on  posture)    Assistive device  Straight cane;1 person hand held assist;Prosthesis;Rolling walker    Gait Pattern  Decreased step length - left;Decreased stance time - right;Step-through pattern;Decreased stride length;Right hip hike;Abducted- right    Ambulation Surface  Indoor;Level      Self-Care   Lifting  PT demo, instructed in lifting light item from floor with TFA prosthesis. Pt return demo understanding with Parallel bar support initially and progressed to cane support with minA. 2 reps reaching ea UE 2 conditions (parallel bars & cane)      Neuro Re-ed    Neuro Re-ed Details   standing balance in parallel bars with intermittent touch: Red theraband reciprocal UEs then BUEs 5 reps ea. - rows, overhead reach & forward reach.       Prosthetics   Prosthetic Care Comments   encourage pt to increase prosthesis wear towards >80% for decrease fall risk    Current prosthetic wear tolerance (days/week)   daily    Current prosthetic wear tolerance (#hours/day)   9 hrs 9am-6pm    Residual limb condition   intact with no issues per pt    Education Provided  Proper wear schedule/adjustment    Person(s) Educated  Patient;Spouse    Education Method  Explanation;Verbal cues    Education Method  Verbalized understanding             PT Education - 06/29/18 1055    Education Details  only using cane near counter or rail for safety.  sit to stand with support near for safety but goal to not have to touch    Person(s) Educated  Patient;Spouse    Methods  Explanation    Comprehension  Verbalized understanding       PT Short Term Goals - 06/17/18 1250      PT SHORT TERM GOAL #1   Title  Patient reports prosthesis wear daily >8 hrs total /day without skin issues.     Baseline  06/15/18; goal met    Status  Achieved      PT SHORT TERM GOAL #2   Title  Patient stands without UE support for 1 minute with supervision, reaches 2" & looks to side with min guard.     Baseline  06/15/18; able to  stand 1 min without UE support ,min-supervision needs UE support with reaching and looking to side.     Status  Partially Met      PT SHORT TERM GOAL #3   Title  Patient ambulates 250' with RW & prosthesis with supervision.     Baseline  06/21/18; ambulated 220' with min gaurd; pt fatigued and required rest breaks     Status  Not Met      PT SHORT TERM GOAL #4  Title  Patient ambulates 40' using cane & counter support with minA.     Baseline  Met: 06/17/18.    Status  Achieved      PT SHORT TERM GOAL #5   Title  Patient negotiates stairs with 1 rail/cane, ramps & curbs with RW & prosthesis with supervision.      Baseline  06/15/18  ramps & curbs with RW min assist and stairs with cane min assist     Status  Not Met        PT Long Term Goals - 05/28/18 1458      PT LONG TERM GOAL #1   Title  Patient verbalizes & demonstrates proper prosthetic care to enable safe use of prosthesis. (All LTGs Target Date: 07/17/2018)    Time  12    Period  Weeks    Status  On-going    Target Date  07/17/18      PT LONG TERM GOAL #2   Title  Patient tolerates prosthesis wear >80% of awake hours without skin issues or limb pain to enable function throughout his day.     Time  12    Period  Weeks    Status  On-going    Target Date  07/17/18      PT LONG TERM GOAL #3   Title  Berg Balance >20/56 to reduce fall risk.     Time  12    Period  Weeks    Status  Revised    Target Date  07/17/18      PT LONG TERM GOAL #4   Title  Patient ambulates 300' with LRAD & Prosthesis outdoors including grass modified independent for community mobility.     Time  12    Period  Weeks    Status  Revised    Target Date  07/17/18      PT LONG TERM GOAL #5   Title  Patient negotiates ramps, curbs & stairs with LRAD & prosthesis modified independent to enable community access.     Time  12    Period  Weeks    Status  On-going    Target Date  07/17/18      PT LONG TERM GOAL #6   Title  Patient ambulates  around furniture carrying cup of water with LRAD & prosthesis modified independent for household mobility.     Time  12    Period  Weeks    Status  On-going    Target Date  07/17/18            Plan - 06/29/18 1213    Clinical Impression Statement  Today's skilled session focused on standing balance, sit to/from stand with cane or less support to stabilize, picking up objects from floor with cane support and hand held cane prosthetic gait.     Rehab Potential  Good    PT Frequency  2x / week    PT Duration  12 weeks    PT Treatment/Interventions  ADLs/Self Care Home Management;DME Instruction;Gait training;Stair training;Canalith Repostioning;Functional mobility training;Therapeutic activities;Therapeutic exercise;Balance training;Neuromuscular re-education;Patient/family education;Prosthetic Training;Vestibular    PT Next Visit Plan  Standing balance & prosthetic gait with cane, stairs (1 rail & SPC quad tip), ramps & curbs.    Consulted and Agree with Plan of Care  Patient    Family Member Consulted  wife, Benjamine Mola       Patient will benefit from skilled therapeutic intervention in order to improve the following deficits and impairments:  Abnormal gait, Decreased activity tolerance, Decreased balance, Decreased endurance, Decreased knowledge of use of DME, Decreased mobility, Impaired flexibility, Decreased strength, Postural dysfunction, Prosthetic Dependency  Visit Diagnosis: Unsteadiness on feet  Other abnormalities of gait and mobility  Abnormal posture  Muscle weakness (generalized)     Problem List Patient Active Problem List   Diagnosis Date Noted  . Pain of right lower extremity   . Type 2 diabetes mellitus with peripheral neuropathy (HCC)   . Benign essential HTN   . Coronary artery disease involving coronary bypass graft of native heart without angina pectoris   . Chronic combined systolic and diastolic congestive heart failure ([place])   . Leukocytosis   .  Transaminitis   . Unilateral AKA, right ([place])   . Cardiac arrest ([place])   . Bradycardia 12/06/2017  . History of atherosclerotic cardiovascular disease 12/06/2017  . Diabetic foot infection ([place]) 11/28/2017  . Acute CHF (congestive heart failure) ([place]) 11/28/2017  . Rectal bleeding 10/04/2013  . Heme positive stool 10/04/2013  . Community acquired pneumonia 07/30/2013  . DM type 2 (diabetes mellitus, type 2) ([place]) 07/30/2013  . HTN (hypertension), benign 07/30/2013  . CAROTID BRUITS, BILATERAL 07/16/2010  . MITRAL VALVE PROLAPSE 08/22/2009  . DM 07/22/2009  . Atherosclerotic cardiovascular disease 07/22/2009  . 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 ERECTILE DYSFUNCTION 05/27/2008  . INGUINAL HERNIA, RIGHT 10/19/2007  . ANEMIA, NORMOCYTIC 10/09/2007  . Dyslipidemia 12/19/2006  . PERIPHERAL NEUROPATHY 12/19/2006  . Unspecified glaucoma 12/19/2006  . Essential hypertension 12/19/2006  . MYOCARDIAL INFARCTION, HX OF 12/19/2006  . PERIPHERAL VASCULAR DISEASE 12/19/2006    Jamey Reas PT, DPT 06/29/2018, 12:16 PM  [facility] [address] [place], [place], [address] Phone: [phone]   Fax:  [phone]  Name: KARTHIKEYA FUNKE MRN: [id number] Date of Birth: [birth year]

## 2018-07-01 ENCOUNTER — Ambulatory Visit: Payer: Medicare Other | Admitting: Physical Therapy

## 2018-07-06 ENCOUNTER — Ambulatory Visit: Payer: Medicare Other | Admitting: Physical Therapy

## 2018-07-06 ENCOUNTER — Encounter: Payer: Self-pay | Admitting: Physical Therapy

## 2018-07-06 DIAGNOSIS — M6281 Muscle weakness (generalized): Secondary | ICD-10-CM

## 2018-07-06 DIAGNOSIS — R293 Abnormal posture: Secondary | ICD-10-CM

## 2018-07-06 DIAGNOSIS — R2681 Unsteadiness on feet: Secondary | ICD-10-CM

## 2018-07-06 DIAGNOSIS — R2689 Other abnormalities of gait and mobility: Secondary | ICD-10-CM

## 2018-07-07 NOTE — Therapy (Signed)
[facility] [address] [place], [place], [address] Phone: [phone]   Fax:  [phone]  Physical Therapy Treatment  Patient Details  Name: Marcus Hendrix MRN: [id number] Date of Birth: 04-17-1941 Referring Provider: Harold Barban, MD   Encounter Date: 07/06/2018  PT End of Session - 07/06/18 1054    Visit Number  18    Number of Visits  25    Date for PT Re-Evaluation  07/20/18    Authorization Type  [employer] Medicare - 10th visit PN    PT Start Time  1015    PT Stop Time  1053    PT Time Calculation (min)  38 min    Equipment Utilized During Treatment  Gait belt    Activity Tolerance  Patient tolerated treatment well    Behavior During Therapy  WFL for tasks assessed/performed       Past Medical History:  Diagnosis Date  . Cerebrovascular disease   . Diabetes mellitus   . Diabetes mellitus ([place])   . ED (erectile dysfunction)   . Glaucoma, left eye   . History of atherosclerotic cardiovascular disease   . History of myocardial infarction   . Hyperlipidemia   . Hypertension   . Inguinal hernia    Right  . Normocytic anemia   . Peripheral neuropathy   . PVD (peripheral vascular disease) ([place])   . Tubulovillous adenoma of colon     Past Surgical History:  Procedure Laterality Date  . AMPUTATION Right 12/12/2017   Procedure: AMPUTATION ABOVE KNEE RIGHT;  Surgeon: Serafina Mitchell, MD;  Location: [facility];  Service: Vascular;  Laterality: Right;  . CHOLECYSTECTOMY    . COLONOSCOPY [name] 07/22/2014   Procedure: COLONOSCOPY;  Surgeon: [name], MD;  [facility];  Service: Endoscopy;  Laterality: [name];  1235-moved to [place] notified pt  . CORONARY ARTERY BYPASS GRAFT  2002   x6  . INGUINAL HERNIA REPAIR  2004   Left  . LOWER EXTREMITY ANGIOGRAPHY [name] 12/08/2017   Procedure: LOWER EXTREMITY ANGIOGRAPHY;  Surgeon: Elam Dutch, MD;  Location: [facility];  Service:  Cardiovascular;  Laterality: [name];  . PERIPHERAL VASCULAR INTERVENTION Bilateral 12/08/2017   Procedure: PERIPHERAL VASCULAR INTERVENTION;  Surgeon: Elam Dutch, MD;  Location: [facility];  Service: Cardiovascular;  Laterality: Bilateral;  [place]    There were no vitals filed for this visit.  Subjective Assessment - 07/06/18 1015    Subjective  He has been using cane around house near counter or something he can touch. No falls or near falls. He is beginning to be able to go further with RW & prosthesis.     Patient is accompained by:  Family member spouse, [name]    Pertinent History  R TFA, CAD, cardiac arrest, CABG x2, DM, HTN, peripheral neuropathy, bradycardia, glaucoma, PVD     Limitations  Lifting;Standing;Walking;House hold activities    Patient Stated Goals  To walk with prosthesis around house, yard & store, fishing    Currently in Pain?  No/denies                       [facility] Adult PT Treatment/Exercise - 07/06/18 1015      Transfers   Transfers  Stand to Sit;Sit to [employer] Transfers    Sit to Stand  5: Supervision;4: Min assist;With upper extremity assist;From chair/3-in-1 working on cane only to stabilize,  Sit to Stand Details  Visual cues/gestures for sequencing;Visual cues for safe use of DME/AE;Verbal cues for technique    Sit to Stand Details (indicate cue type and reason)  cues on techique from chairs without armrests    Stand to Sit  5: Supervision;With upper extremity assist;With armrests;To chair/3-in-1 working on cane only for stabilization    Stand to Sit Details (indicate cue type and reason)  Visual cues for safe use of DME/AE;Verbal cues for sequencing;Verbal cues for technique;Verbal cues for gait pattern    Stand to Sit Details  cues on techique from chairs without armrests      Ambulation/Gait   Ambulation/Gait  Yes    Ambulation/Gait Assistance  3: Mod assist;5: Supervision ModA cane, Supervision RW     Ambulation/Gait Assistance Details  verbal & manual cues on posture, wt shift over prosthesis and posture    Ambulation Distance (Feet)  40 Feet 40' X 4 cane/hand hold, 100' X 2 RW (cues on posture)    Assistive device  Straight cane;1 person hand held assist;Prosthesis;Rolling walker    Gait Pattern  Decreased step length - left;Decreased stance time - right;Step-through pattern;Decreased stride length;Right hip hike;Abducted- right    Stairs  Yes    Stairs Assistance  5: Supervision    Stairs Assistance Details (indicate cue type and reason)  use of cane with single rail with rail on either side.    Stair Management Technique  One rail Right;One rail Left;With cane;Step to pattern;Forwards    Number of Stairs  4 2 reps (1 with rail right UE & 1 with rail Left UE)      Self-Care   Lifting  Pt picking up object from floor with RW support with verbal cues      Neuro Re-ed    Neuro Re-ed Details   standing balance in parallel bars with intermittent touch: Red theraband reciprocal UEs then BUEs 5 reps ea. - rows, overhead reach & forward reach.       Prosthetics   Prosthetic Care Comments   encourage pt to increase prosthesis wear towards >80% for decrease fall risk    Current prosthetic wear tolerance (days/week)   daily    Current prosthetic wear tolerance (#hours/day)   9 hrs 9am-6pm    Residual limb condition   intact with no issues per pt    Education Provided  Proper wear schedule/adjustment               PT Short Term Goals - 06/17/18 1250      PT SHORT TERM GOAL #1   Title  Patient reports prosthesis wear daily >8 hrs total /day without skin issues.     Baseline  06/15/18; goal met    Status  Achieved      PT SHORT TERM GOAL #2   Title  Patient stands without UE support for 1 minute with supervision, reaches 2" & looks to side with min guard.     Baseline  06/15/18; able to stand 1 min without UE support ,min-supervision needs UE support with reaching and looking to side.      Status  Partially Met      PT SHORT TERM GOAL #3   Title  Patient ambulates 250' with RW & prosthesis with supervision.     Baseline  06/21/18; ambulated 220' with min gaurd; pt fatigued and required rest breaks     Status  Not Met      PT SHORT TERM GOAL #4  Title  Patient ambulates 11' using cane & counter support with minA.     Baseline  Met: 06/17/18.    Status  Achieved      PT SHORT TERM GOAL #5   Title  Patient negotiates stairs with 1 rail/cane, ramps & curbs with RW & prosthesis with supervision.      Baseline  06/15/18  ramps & curbs with RW min assist and stairs with cane min assist     Status  Not Met        PT Long Term Goals - 05/28/18 1458      PT LONG TERM GOAL #1   Title  Patient verbalizes & demonstrates proper prosthetic care to enable safe use of prosthesis. (All LTGs Target Date: 07/17/2018)    Time  12    Period  Weeks    Status  On-going    Target Date  07/17/18      PT LONG TERM GOAL #2   Title  Patient tolerates prosthesis wear >80% of awake hours without skin issues or limb pain to enable function throughout his day.     Time  12    Period  Weeks    Status  On-going    Target Date  07/17/18      PT LONG TERM GOAL #3   Title  Berg Balance >20/56 to reduce fall risk.     Time  12    Period  Weeks    Status  Revised    Target Date  07/17/18      PT LONG TERM GOAL #4   Title  Patient ambulates 300' with LRAD & Prosthesis outdoors including grass modified independent for community mobility.     Time  12    Period  Weeks    Status  Revised    Target Date  07/17/18      PT LONG TERM GOAL #5   Title  Patient negotiates ramps, curbs & stairs with LRAD & prosthesis modified independent to enable community access.     Time  12    Period  Weeks    Status  On-going    Target Date  07/17/18      PT LONG TERM GOAL #6   Title  Patient ambulates around furniture carrying cup of water with LRAD & prosthesis modified independent for household  mobility.     Time  12    Period  Weeks    Status  On-going    Target Date  07/17/18            Plan - 07/06/18 1746    Clinical Impression Statement  Patient improved prosthetic gait with cane with PT instruction & tactile cues. Today's skilled session focused on balance activities including sit to/from stand and gait with cane.     Rehab Potential  Good    PT Frequency  2x / week    PT Duration  12 weeks    PT Treatment/Interventions  ADLs/Self Care Home Management;DME Instruction;Gait training;Stair training;Canalith Repostioning;Functional mobility training;Therapeutic activities;Therapeutic exercise;Balance training;Neuromuscular re-education;Patient/family education;Prosthetic Training;Vestibular    PT Next Visit Plan  Standing balance & prosthetic gait with cane, stairs (1 rail & SPC quad tip), ramps & curbs with RW.    Consulted and Agree with Plan of Care  Patient    Family Member Consulted  wife, Benjamine Mola       Patient will benefit from skilled therapeutic intervention in order to improve the following deficits and impairments:  Abnormal  gait, Decreased activity tolerance, Decreased balance, Decreased endurance, Decreased knowledge of use of DME, Decreased mobility, Impaired flexibility, Decreased strength, Postural dysfunction, Prosthetic Dependency  Visit Diagnosis: Unsteadiness on feet  Other abnormalities of gait and mobility  Abnormal posture  Muscle weakness (generalized)     Problem List Patient Active Problem List   Diagnosis Date Noted  . Pain of right lower extremity   . Type 2 diabetes mellitus with peripheral neuropathy (HCC)   . Benign essential HTN   . Coronary artery disease involving coronary bypass graft of native heart without angina pectoris   . Chronic combined systolic and diastolic congestive heart failure ([place])   . Leukocytosis   . Transaminitis   . Unilateral AKA, right ([place])   . Cardiac arrest ([place])   . Bradycardia 12/06/2017  .  History of atherosclerotic cardiovascular disease 12/06/2017  . Diabetic foot infection ([place]) 11/28/2017  . Acute CHF (congestive heart failure) ([place]) 11/28/2017  . Rectal bleeding 10/04/2013  . Heme positive stool 10/04/2013  . Community acquired pneumonia 07/30/2013  . DM type 2 (diabetes mellitus, type 2) ([place]) 07/30/2013  . HTN (hypertension), benign 07/30/2013  . CAROTID BRUITS, BILATERAL 07/16/2010  . MITRAL VALVE PROLAPSE 08/22/2009  . DM 07/22/2009  . Atherosclerotic cardiovascular disease 07/22/2009  . 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 ERECTILE DYSFUNCTION 05/27/2008  . INGUINAL HERNIA, RIGHT 10/19/2007  . ANEMIA, NORMOCYTIC 10/09/2007  . Dyslipidemia 12/19/2006  . PERIPHERAL NEUROPATHY 12/19/2006  . Unspecified glaucoma 12/19/2006  . Essential hypertension 12/19/2006  . MYOCARDIAL INFARCTION, HX OF 12/19/2006  . PERIPHERAL VASCULAR DISEASE 12/19/2006    Jamey Reas PT, DPT 07/07/2018, 7:49 AM  [facility] [address] [place], [place], [address] Phone: [phone]   Fax:  [phone]  Name: JAVARI BUFKIN MRN: [id number] Date of Birth: 1941/11/24

## 2018-07-08 ENCOUNTER — Encounter: Payer: Self-pay | Admitting: Physical Therapy

## 2018-07-08 ENCOUNTER — Ambulatory Visit: Payer: Medicare Other | Admitting: Physical Therapy

## 2018-07-08 DIAGNOSIS — R2681 Unsteadiness on feet: Secondary | ICD-10-CM

## 2018-07-08 DIAGNOSIS — R2689 Other abnormalities of gait and mobility: Secondary | ICD-10-CM

## 2018-07-08 DIAGNOSIS — M6281 Muscle weakness (generalized): Secondary | ICD-10-CM

## 2018-07-08 DIAGNOSIS — R293 Abnormal posture: Secondary | ICD-10-CM

## 2018-07-08 NOTE — Therapy (Signed)
[facility] [address] [place], [place], [address] Phone: [phone]   Fax:  [phone]  Physical Therapy Treatment  Patient Details  Name: Marcus Hendrix MRN: [id number] Date of Birth: [birth year] Referring Provider: Harold Barban, MD   Encounter Date: 07/08/2018  PT End of Session - 07/08/18 1103    Visit Number  19    Number of Visits  25    Date for PT Re-Evaluation  07/20/18    Authorization Type  [employer] Medicare - 10th visit PN    PT Start Time  1020    PT Stop Time  1100    PT Time Calculation (min)  40 min    Equipment Utilized During Treatment  Gait belt    Activity Tolerance  Patient tolerated treatment well    Behavior During Therapy  WFL for tasks assessed/performed       Past Medical History:  Diagnosis Date  . Cerebrovascular disease   . Diabetes mellitus   . Diabetes mellitus ([place])   . ED (erectile dysfunction)   . Glaucoma, left eye   . History of atherosclerotic cardiovascular disease   . History of myocardial infarction   . Hyperlipidemia   . Hypertension   . Inguinal hernia    Right  . Normocytic anemia   . Peripheral neuropathy   . PVD (peripheral vascular disease) ([place])   . Tubulovillous adenoma of colon     Past Surgical History:  Procedure Laterality Date  . AMPUTATION Right 12/12/2017   Procedure: AMPUTATION ABOVE KNEE RIGHT;  Surgeon: Serafina Mitchell, MD;  Location: [facility];  Service: Vascular;  Laterality: Right;  . CHOLECYSTECTOMY    . COLONOSCOPY [name] 07/22/2014   Procedure: COLONOSCOPY;  Surgeon: [name], MD;  [facility];  Service: Endoscopy;  Laterality: [name];  1235-moved to [place] notified pt  . CORONARY ARTERY BYPASS GRAFT  2002   x6  . INGUINAL HERNIA REPAIR  2004   Left  . LOWER EXTREMITY ANGIOGRAPHY [name] 12/08/2017   Procedure: LOWER EXTREMITY ANGIOGRAPHY;  Surgeon: Elam Dutch, MD;  Location: [facility];  Service:  Cardiovascular;  Laterality: [name];  . PERIPHERAL VASCULAR INTERVENTION Bilateral 12/08/2017   Procedure: PERIPHERAL VASCULAR INTERVENTION;  Surgeon: Elam Dutch, MD;  Location: [facility];  Service: Cardiovascular;  Laterality: Bilateral;  [place]    There were no vitals filed for this visit.  Subjective Assessment - 07/08/18 1029    Subjective  Pt went to the prosthetist and was encouraged to wear more ply socks.  Pt did not increase ply this session but sees that he needs to since the socket was rotating in.  Itching at the end of the day hinders pt from wearing prosthesis longer in the day.    Patient is accompained by:  Family member spouse, [name]    Pertinent History  R TFA, CAD, cardiac arrest, CABG x2, DM, HTN, peripheral neuropathy, bradycardia, glaucoma, PVD     Limitations  Lifting;Standing;Walking;House hold activities    Patient Stated Goals  To walk with prosthesis around house, yard & store, fishing    Currently in Pain?  No/denies                       [facility] Adult PT Treatment/Exercise - 07/08/18 0001      Transfers   Transfers  Sit to Stand;Stand to Sit;Stand Pivot Transfers    Sit to Stand  5:  Supervision;4: Min guard    Sit to Stand Details  Visual cues/gestures for sequencing;Visual cues for safe use of DME/AE;Verbal cues for technique    Sit to Stand Details (indicate cue type and reason)  cues to scoot forward first, and sequencing with prosthesis    Stand to Sit  5: Supervision;With upper extremity assist;With armrests;To chair/3-in-1    Stand to Sit Details (indicate cue type and reason)  Visual cues for safe use of DME/AE;Verbal cues for sequencing;Verbal cues for technique;Verbal cues for gait pattern      Ambulation/Gait   Ambulation/Gait  Yes    Ambulation/Gait Assistance  3: Mod assist;5: Supervision    Ambulation/Gait Assistance Details  cues for posture,to  decrease UE support and gait mechanics with  SPC and RW.                                   Ambulation Distance (Feet)  10 Feet x2 [facility]) + 80' (RW)    Assistive device  Straight cane;1 person hand held assist;Prosthesis;Rolling walker    Gait Pattern  Decreased step length - left;Decreased stance time - right;Step-through pattern;Decreased stride length;Right hip hike;Abducted- right    Ramp  5: Supervision    Ramp Details (indicate cue type and reason)  good technique; no cues needed.    Curb  5: Supervision    Curb Details (indicate cue type and reason)  good technique, min cues needed.    Gait Comments  pt limited with gait using a SPC due to pain in Left LE after amb 10x2 ,reported LLE fatigue and pain 5/10.      Prosthetics   Prosthetic Care Comments   Reviewed: encourage pt to increase prosthesis wear towards >80% for decrease fall risk    Current prosthetic wear tolerance (days/week)   daily    Current prosthetic wear tolerance (#hours/day)   9 hrs 9am-6pm    Education Provided  Correct ply sock adjustment;Proper wear schedule/adjustment    Person(s) Educated  Patient;Spouse    Education [employer] cues;Explanation    Education Method  Verbalized understanding             PT Education - 07/08/18 1325    Education provided  Yes    Education Details  Pt's plan on how to get to goal of wearing prosthesis> 80%.  Importance of consistently scooting forward and remembering safe technique with transfers.    Person(s) Educated  Patient;Spouse    Methods  Explanation    Comprehension  Verbalized understanding       PT Short Term Goals - 06/17/18 1250      PT SHORT TERM GOAL #1   Title  Patient reports prosthesis wear daily >8 hrs total /day without skin issues.     Baseline  06/15/18; goal met    Status  Achieved      PT SHORT TERM GOAL #2   Title  Patient stands without UE support for 1 minute with supervision, reaches 2" & looks to side with min guard.     Baseline  06/15/18; able to stand 1 min without UE support ,min-supervision needs UE  support with reaching and looking to side.     Status  Partially Met      PT SHORT TERM GOAL #3   Title  Patient ambulates 250' with RW & prosthesis with supervision.     Baseline  06/21/18; ambulated 220' with min  gaurd; pt fatigued and required rest breaks     Status  Not Met      PT SHORT TERM GOAL #4   Title  Patient ambulates 66' using cane & counter support with minA.     Baseline  Met: 06/17/18.    Status  Achieved      PT SHORT TERM GOAL #5   Title  Patient negotiates stairs with 1 rail/cane, ramps & curbs with RW & prosthesis with supervision.      Baseline  06/15/18  ramps & curbs with RW min assist and stairs with cane min assist     Status  Not Met        PT Long Term Goals - 05/28/18 1458      PT LONG TERM GOAL #1   Title  Patient verbalizes & demonstrates proper prosthetic care to enable safe use of prosthesis. (All LTGs Target Date: 07/17/2018)    Time  12    Period  Weeks    Status  On-going    Target Date  07/17/18      PT LONG TERM GOAL #2   Title  Patient tolerates prosthesis wear >80% of awake hours without skin issues or limb pain to enable function throughout his day.     Time  12    Period  Weeks    Status  On-going    Target Date  07/17/18      PT LONG TERM GOAL #3   Title  Berg Balance >20/56 to reduce fall risk.     Time  12    Period  Weeks    Status  Revised    Target Date  07/17/18      PT LONG TERM GOAL #4   Title  Patient ambulates 300' with LRAD & Prosthesis outdoors including grass modified independent for community mobility.     Time  12    Period  Weeks    Status  Revised    Target Date  07/17/18      PT LONG TERM GOAL #5   Title  Patient negotiates ramps, curbs & stairs with LRAD & prosthesis modified independent to enable community access.     Time  12    Period  Weeks    Status  On-going    Target Date  07/17/18      PT LONG TERM GOAL #6   Title  Patient ambulates around furniture carrying cup of water with LRAD & prosthesis  modified independent for household mobility.     Time  12    Period  Weeks    Status  On-going    Target Date  07/17/18            Plan - 07/08/18 1319    Clinical Impression Statement  LLE pain with fatigue limited gait training with SPC today.  Pt rquires Mod A ( HHA) with SPC and cues for posture, balance, and sequencing during gait and transfers.  Pt is progressing with negotiating community barriers with RW performing at a supervision level with min cues.                                           Rehab Potential  Good    PT Frequency  2x / week    PT Duration  12 weeks    PT Treatment/Interventions  ADLs/Self Care  Home Management;DME Instruction;Gait training;Stair training;Canalith Repostioning;Functional mobility training;Therapeutic activities;Therapeutic exercise;Balance training;Neuromuscular re-education;Patient/family education;Prosthetic Training;Vestibular    PT Next Visit Plan  Address the issue of residual limb's skin being so itchy it hinders pt from wearing prosthesis for longer periods.  Standing balance & prosthetic gait with cane, stairs (1 rail & SPC quad tip), ramps & curbs with RW.    Consulted and Agree with Plan of Care  Patient    Family Member Consulted  wife, Benjamine Mola       Patient will benefit from skilled therapeutic intervention in order to improve the following deficits and impairments:  Abnormal gait, Decreased activity tolerance, Decreased balance, Decreased endurance, Decreased knowledge of use of DME, Decreased mobility, Impaired flexibility, Decreased strength, Postural dysfunction, Prosthetic Dependency  Visit Diagnosis: Unsteadiness on feet  Other abnormalities of gait and mobility  Abnormal posture  Muscle weakness (generalized)     Problem List Patient Active Problem List   Diagnosis Date Noted  . Pain of right lower extremity   . Type 2 diabetes mellitus with peripheral neuropathy (HCC)   . Benign essential HTN   . Coronary  artery disease involving coronary bypass graft of native heart without angina pectoris   . Chronic combined systolic and diastolic congestive heart failure ([place])   . Leukocytosis   . Transaminitis   . Unilateral AKA, right ([place])   . Cardiac arrest ([place])   . Bradycardia 12/06/2017  . History of atherosclerotic cardiovascular disease 12/06/2017  . Diabetic foot infection ([place]) 11/28/2017  . Acute CHF (congestive heart failure) ([place]) 11/28/2017  . Rectal bleeding 10/04/2013  . Heme positive stool 10/04/2013  . Community acquired pneumonia 07/30/2013  . DM type 2 (diabetes mellitus, type 2) ([place]) 07/30/2013  . HTN (hypertension), benign 07/30/2013  . CAROTID BRUITS, BILATERAL 07/16/2010  . MITRAL VALVE PROLAPSE 08/22/2009  . DM 07/22/2009  . Atherosclerotic cardiovascular disease 07/22/2009  . 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 ERECTILE DYSFUNCTION 05/27/2008  . INGUINAL HERNIA, RIGHT 10/19/2007  . ANEMIA, NORMOCYTIC 10/09/2007  . Dyslipidemia 12/19/2006  . PERIPHERAL NEUROPATHY 12/19/2006  . Unspecified glaucoma 12/19/2006  . Essential hypertension 12/19/2006  . MYOCARDIAL INFARCTION, HX OF 12/19/2006  . PERIPHERAL VASCULAR DISEASE 12/19/2006    Bjorn Loser, PTA  07/08/18, 1:35 PM [facility] [address] [place], [place], [address] Phone: [phone]   Fax:  [phone]  Name: Marcus Hendrix MRN: [id number] Date of Birth: 03-22-1941

## 2018-07-13 ENCOUNTER — Ambulatory Visit: Payer: Medicare Other | Admitting: Physical Therapy

## 2018-07-13 ENCOUNTER — Encounter: Payer: Self-pay | Admitting: Physical Therapy

## 2018-07-13 DIAGNOSIS — M6281 Muscle weakness (generalized): Secondary | ICD-10-CM

## 2018-07-13 DIAGNOSIS — R2681 Unsteadiness on feet: Secondary | ICD-10-CM

## 2018-07-13 DIAGNOSIS — R293 Abnormal posture: Secondary | ICD-10-CM

## 2018-07-13 DIAGNOSIS — R2689 Other abnormalities of gait and mobility: Secondary | ICD-10-CM

## 2018-07-13 NOTE — Therapy (Signed)
[facility] [address] [place], [place], [address] Phone: [phone]   Fax:  [phone]  Physical Therapy Treatment  Patient Details  Name: Marcus Hendrix MRN: [id number] Date of Birth: 03/25/1941 Referring Provider: Harold Barban, MD   Encounter Date: 07/13/2018    Past Medical History:  Diagnosis Date  . Cerebrovascular disease   . Diabetes mellitus   . Diabetes mellitus ([place])   . ED (erectile dysfunction)   . Glaucoma, left eye   . History of atherosclerotic cardiovascular disease   . History of myocardial infarction   . Hyperlipidemia   . Hypertension   . Inguinal hernia    Right  . Normocytic anemia   . Peripheral neuropathy   . PVD (peripheral vascular disease) ([place])   . Tubulovillous adenoma of colon     Past Surgical History:  Procedure Laterality Date  . AMPUTATION Right 12/12/2017   Procedure: AMPUTATION ABOVE KNEE RIGHT;  Surgeon: Serafina [place], MD;  Location: [facility];  Service: Vascular;  Laterality: Right;  . CHOLECYSTECTOMY    . COLONOSCOPY [name] 07/22/2014   Procedure: COLONOSCOPY;  Surgeon: [name], MD;  [facility];  Service: Endoscopy;  Laterality: [name];  1235-moved to [place] notified pt  . CORONARY ARTERY BYPASS GRAFT  2002   x6  . INGUINAL HERNIA REPAIR  2004   Left  . LOWER EXTREMITY ANGIOGRAPHY [name] 12/08/2017   Procedure: LOWER EXTREMITY ANGIOGRAPHY;  Surgeon: Elam Dutch, MD;  Location: [facility];  Service: Cardiovascular;  Laterality: [name];  . PERIPHERAL VASCULAR INTERVENTION Bilateral 12/08/2017   Procedure: PERIPHERAL VASCULAR INTERVENTION;  Surgeon: Elam Dutch, MD;  Location: [facility];  Service: Cardiovascular;  Laterality: Bilateral;  [place]    There were no vitals filed for this visit.  Subjective Assessment - 07/13/18 1015    Subjective  No falls. He is using cane near the counter.     Patient is accompained by:   Family member spouse, [name]    Pertinent History  R TFA, CAD, cardiac arrest, CABG x2, DM, HTN, peripheral neuropathy, bradycardia, glaucoma, PVD     Limitations  Lifting;Standing;Walking;House hold activities    Patient Stated Goals  To walk with prosthesis around house, yard & store, fishing    Currently in Pain?  No/denies         [facility] PT Assessment - 07/13/18 1015      Berg Balance Test   Sit to Stand  Able to stand  independently using hands with RW = 3    Standing Unsupported  Able to stand safely 2 minutes with RW = 4    Sitting with Back Unsupported but Feet Supported on Floor or Stool  Able to sit safely and securely 2 minutes    Stand to Sit  Controls descent by using hands with RW = 3    Transfers  Able to transfer safely, definite need of hands    Standing Unsupported with Eyes Closed  Able to stand 3 seconds with RW = 4    Standing Ubsupported with Feet Together  Needs help to attain position but able to stand for 30 seconds with feet together with RW = 3    From Standing, Reach Forward with Outstretched Arm  Can reach forward >5 cm safely (2") with RW = 4    From Standing Position, Pick up Object from Floor  Unable to try/needs assist to keep balance with RW = 3  From Standing Position, Turn to Look Behind Over each Shoulder  Needs assist to keep from losing balance and falling with RW = 4    Turn 360 Degrees  Needs assistance while turning with RW = 2    Standing Unsupported, Alternately Place Feet on Step/Stool  Needs assistance to keep from falling or unable to try with RW = 2    Standing Unsupported, One Foot in [employer] balance while stepping or standing with RW = 3    Standing on One Leg  Tries to lift leg/unable to hold 3 seconds but remains standing independently with RW = 4    Total Score  23    Berg comment:  Initial was 10/56 no device, tasks with RW initially 32/56 & today 46/56      Prosthetics Assessment - 07/13/18 [place] with  Skin check;Residual limb care;Care of non-amputated limb;Prosthetic cleaning;Ply sock cleaning;Correct ply sock adjustment;Proper wear schedule/adjustment;Proper weight-bearing schedule/adjustment    Donning prosthesis   Modified independent (Device/Increase time)    Doffing prosthesis   Modified independent (Device/Increase time)    Current prosthetic wear tolerance (days/week)   daily    Current prosthetic wear tolerance (#hours/day)   reports most of awake hours (>80%)    Current prosthetic weight-bearing tolerance (hours/day)   Pt tolerated 10 minutes standing consecutive time & intermittent >81mnutes of session without pain or limb discomfort.     Edema  no edema in residual limb    Residual limb condition   intact with no issues per pt                  [facility] - 07[facility] 1015      Self-Care   Self-Care  ADL's    ADL's  PT instructed in using prosthesis to enter/exit bathroom for bathing. Doffing & donning either on toilet seat neat tub or sitting on shower chair to remove prosthesis. When traveling, PT recommends informing hotel so can have a bench in his room. Pt & wife verbalized understanding.       Therapeutic Activites    Other Therapeutic Activities  Pt goal of fishing from pier addressed: PT demo, instructed in positioning with prosthesis set ~1-2" back to LLE, rail in front & RW placed behind to cast. Pt return demo understanding including casting without balance loss.       Prosthetics   Prosthetic Care Comments   PT instructed for traveling to make a list of all things needed with prosthesis including both liners, socks, etc so when packing for trip that all necessary items are available. PT set up appointment with prosthetist to get prosthesis laminated and he will set up 2nd appt later to cover prosthesis per pt request.                PT Short Term Goals - 06/17/18 1250      PT SHORT TERM GOAL  #1   Title  Patient reports prosthesis wear daily >8 hrs total /day without skin issues.     Baseline  06/15/18; goal met    Status  Achieved      PT SHORT TERM GOAL #2   Title  Patient stands without UE support for 1 minute with supervision, reaches 2" & looks to side with min guard.     Baseline  06/15/18; able to stand 1 min without UE support ,min-supervision needs UE support with reaching  and looking to side.     Status  Partially Met      PT SHORT TERM GOAL #3   Title  Patient ambulates 250' with RW & prosthesis with supervision.     Baseline  06/21/18; ambulated 220' with min gaurd; pt fatigued and required rest breaks     Status  Not Met      PT SHORT TERM GOAL #4   Title  Patient ambulates 65' using cane & counter support with minA.     Baseline  Met: 06/17/18.    Status  Achieved      PT SHORT TERM GOAL #5   Title  Patient negotiates stairs with 1 rail/cane, ramps & curbs with RW & prosthesis with supervision.      Baseline  06/15/18  ramps & curbs with RW min assist and stairs with cane min assist     Status  Not Met        PT Long Term Goals - 07/13/18 1121      PT LONG TERM GOAL #1   Title  Patient verbalizes & demonstrates proper prosthetic care to enable safe use of prosthesis. (All LTGs Target Date: 07/17/2018)    Baseline  MET 07/13/18    Time  12    Period  Weeks    Status  Achieved      PT LONG TERM GOAL #2   Title  Patient tolerates prosthesis wear >80% of awake hours without skin issues or limb pain to enable function throughout his day.     Baseline  MET 07/13/18    Time  12    Period  Weeks    Status  Achieved      PT LONG TERM GOAL #3   Title  Berg Balance >20/56 to reduce fall risk.     Baseline  MET 07/13/18  Gar Ponto  and Merrilee Jansky Tasks with RW improved from 32/56 to 46/56    Time  12    Period  Weeks    Status  Achieved      PT LONG TERM GOAL #4   Title  Patient ambulates 300' with LRAD & Prosthesis outdoors including grass modified independent for  community mobility.     Time  12    Period  Weeks    Status  Revised      PT LONG TERM GOAL #5   Title  Patient negotiates ramps, curbs & stairs with LRAD & prosthesis modified independent to enable community access.     Time  12    Period  Weeks    Status  On-going      PT LONG TERM GOAL #6   Title  Patient ambulates around furniture carrying cup of water with LRAD & prosthesis modified independent for household mobility.     Time  12    Period  Weeks    Status  On-going            Plan - 07/13/18 1122    Clinical Impression Statement  Patient met 3 of LTGs that were checked today with plans to check remaining 3 at next appt. Pt demonstrated understanding of fishing / casting to enable to return to his goal of fishing.     Rehab Potential  Good    PT Frequency  2x / week    PT Duration  12 weeks    PT Treatment/Interventions  ADLs/Self Care Home Management;DME Instruction;Gait training;Stair training;Canalith Repostioning;Functional mobility training;Therapeutic activities;Therapeutic exercise;Balance training;Neuromuscular re-education;Patient/family education;Prosthetic  Training;Vestibular    PT Next Visit Plan  assess remaining LTGs and discharge    Consulted and Agree with Plan of Care  Patient    Family Member Consulted  wife, Benjamine Mola       Patient will benefit from skilled therapeutic intervention in order to improve the following deficits and impairments:  Abnormal gait, Decreased activity tolerance, Decreased balance, Decreased endurance, Decreased knowledge of use of DME, Decreased mobility, Impaired flexibility, Decreased strength, Postural dysfunction, Prosthetic Dependency  Visit Diagnosis: Unsteadiness on feet  Other abnormalities of gait and mobility  Abnormal posture  Muscle weakness (generalized)     Problem List Patient Active Problem List   Diagnosis Date Noted  . Pain of right lower extremity   . Type 2 diabetes mellitus with peripheral  neuropathy (HCC)   . Benign essential HTN   . Coronary artery disease involving coronary bypass graft of native heart without angina pectoris   . Chronic combined systolic and diastolic congestive heart failure ([place])   . Leukocytosis   . Transaminitis   . Unilateral AKA, right ([place])   . Cardiac arrest ([place])   . Bradycardia 12/06/2017  . History of atherosclerotic cardiovascular disease 12/06/2017  . Diabetic foot infection ([place]) 11/28/2017  . Acute CHF (congestive heart failure) ([place]) 11/28/2017  . Rectal bleeding 10/04/2013  . Heme positive stool 10/04/2013  . Community acquired pneumonia 07/30/2013  . DM type 2 (diabetes mellitus, type 2) ([place]) 07/30/2013  . HTN (hypertension), benign 07/30/2013  . CAROTID BRUITS, BILATERAL 07/16/2010  . MITRAL VALVE PROLAPSE 08/22/2009  . DM 07/22/2009  . Atherosclerotic cardiovascular disease 07/22/2009  . 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 ERECTILE DYSFUNCTION 05/27/2008  . INGUINAL HERNIA, RIGHT 10/19/2007  . ANEMIA, NORMOCYTIC 10/09/2007  . Dyslipidemia 12/19/2006  . PERIPHERAL NEUROPATHY 12/19/2006  . Unspecified glaucoma 12/19/2006  . Essential hypertension 12/19/2006  . MYOCARDIAL INFARCTION, HX OF 12/19/2006  . PERIPHERAL VASCULAR DISEASE 12/19/2006    Jamey Reas PT, DPT 07/13/2018, 11:25 AM  [facility] [address] [place], [place], [address] Phone: [phone]   Fax:  [phone]  Name: Marcus Hendrix MRN: [id number] Date of Birth: 02-07-1941

## 2018-07-15 ENCOUNTER — Encounter: Payer: Self-pay | Admitting: Physical Therapy

## 2018-07-15 ENCOUNTER — Ambulatory Visit: Payer: Medicare Other | Admitting: Physical Therapy

## 2018-07-15 DIAGNOSIS — M6281 Muscle weakness (generalized): Secondary | ICD-10-CM

## 2018-07-15 DIAGNOSIS — R2681 Unsteadiness on feet: Secondary | ICD-10-CM

## 2018-07-15 DIAGNOSIS — R2689 Other abnormalities of gait and mobility: Secondary | ICD-10-CM

## 2018-07-15 DIAGNOSIS — R293 Abnormal posture: Secondary | ICD-10-CM

## 2018-07-16 NOTE — Therapy (Signed)
[facility] [address] [place], [place], [address] Phone: [phone]   Fax:  [phone]  Physical Therapy Treatment  Patient Details  Name: Marcus Hendrix MRN: [id number] Date of Birth: August 11, 1941 Referring Provider: Harold Barban, MD   Encounter Date: 07/15/2018  PT End of Session - 07/15/18 1139    Visit Number  20    Number of Visits  25    Date for PT Re-Evaluation  07/20/18    Authorization Type  [employer] Medicare - 10th visit PN    PT Start Time  1015    PT Stop Time  1100    PT Time Calculation (min)  45 min    Equipment Utilized During Treatment  Gait belt    Activity Tolerance  Patient tolerated treatment well    Behavior During Therapy  [facility] for tasks assessed/performed       Past Medical History:  Diagnosis Date  . Cerebrovascular disease   . Diabetes mellitus   . Diabetes mellitus ([place])   . ED (erectile dysfunction)   . Glaucoma, left eye   . History of atherosclerotic cardiovascular disease   . History of myocardial infarction   . Hyperlipidemia   . Hypertension   . Inguinal hernia    Right  . Normocytic anemia   . Peripheral neuropathy   . PVD (peripheral vascular disease) ([place])   . Tubulovillous adenoma of colon     Past Surgical History:  Procedure Laterality Date  . AMPUTATION Right 12/12/2017   Procedure: AMPUTATION ABOVE KNEE RIGHT;  Surgeon: Serafina [place], MD;  Location: [facility];  Service: Vascular;  Laterality: Right;  . CHOLECYSTECTOMY    . COLONOSCOPY [name] 07/22/2014   Procedure: COLONOSCOPY;  Surgeon: [name], MD;  [facility];  Service: Endoscopy;  Laterality: [name];  1235-moved to [place] notified pt  . CORONARY ARTERY BYPASS GRAFT  2002   x6  . INGUINAL HERNIA REPAIR  2004   Left  . LOWER EXTREMITY ANGIOGRAPHY [name] 12/08/2017   Procedure: LOWER EXTREMITY ANGIOGRAPHY;  Surgeon: Elam Dutch, MD;  Location: [facility];  Service:  Cardiovascular;  Laterality: [name];  . PERIPHERAL VASCULAR INTERVENTION Bilateral 12/08/2017   Procedure: PERIPHERAL VASCULAR INTERVENTION;  Surgeon: Elam Dutch, MD;  Location: [facility];  Service: Cardiovascular;  Laterality: Bilateral;  [place]    There were no vitals filed for this visit.  Subjective Assessment - 07/15/18 1015    Subjective  No falls. He feels PT has helped him.     Patient is accompained by:  Family member spouse, [name]    Pertinent History  R TFA, CAD, cardiac arrest, CABG x2, DM, HTN, peripheral neuropathy, bradycardia, glaucoma, PVD     Limitations  Lifting;Standing;Walking;House hold activities    Patient Stated Goals  To walk with prosthesis around house, yard & store, fishing    Currently in Pain?  No/denies         [facility] PT Assessment - 07/15/18 1015      Ambulation/Gait   Ambulation/Gait  Yes    Ambulation/Gait Assistance  6: Modified independent (Device/Increase time)    Assistive device  Rolling walker;Prosthesis    Ambulation Surface  Indoor;Level;Outdoor;Paved    Stairs  Yes    Stairs Assistance  6: Modified independent (Device/Increase time)    Stairs Assistance Details (indicate cue type and reason)  use of single rail: if right rail, sidestep up/down with BUEs on rail.  If  rail on Left, use rail & cane in contralateral UE.     Stair Management Technique  One rail Right;One rail Left;With cane;Step to pattern;Forwards    Number of Stairs  4 1x right rail, 1x left rail    Ramp  6: Modified independent (Device) RW & TFA prosthesis    Curb  6: Modified independent (Device/increase time) RW & TFA prosthesis    Gait Comments  using walker basket to transport water, etc.                    [facility] Adult PT Treatment/Exercise - 07/15/18 1015      Ambulation/Gait   Ambulation Distance (Feet)  200 Feet distance limited by LLE vascular pain / fatigue    Gait Pattern  Step-through pattern;Decreased stance time -  right;Antalgic;Abducted- right;Decreased step length - left               PT Short Term Goals - 06/17/18 1250      PT SHORT TERM GOAL #1   Title  Patient reports prosthesis wear daily >8 hrs total /day without skin issues.     Baseline  06/15/18; goal met    Status  Achieved      PT SHORT TERM GOAL #2   Title  Patient stands without UE support for 1 minute with supervision, reaches 2" & looks to side with min guard.     Baseline  06/15/18; able to stand 1 min without UE support ,min-supervision needs UE support with reaching and looking to side.     Status  Partially Met      PT SHORT TERM GOAL #3   Title  Patient ambulates 250' with RW & prosthesis with supervision.     Baseline  06/21/18; ambulated 220' with min gaurd; pt fatigued and required rest breaks     Status  Not Met      PT SHORT TERM GOAL #4   Title  Patient ambulates 1' using cane & counter support with minA.     Baseline  Met: 06/17/18.    Status  Achieved      PT SHORT TERM GOAL #5   Title  Patient negotiates stairs with 1 rail/cane, ramps & curbs with RW & prosthesis with supervision.      Baseline  06/15/18  ramps & curbs with RW min assist and stairs with cane min assist     Status  Not Met        PT Long Term Goals - 07/15/18 1640      PT LONG TERM GOAL #1   Title  Patient verbalizes & demonstrates proper prosthetic care to enable safe use of prosthesis. (All LTGs Target Date: 07/17/2018)    Baseline  MET 07/13/18    Time  12    Period  Weeks    Status  Achieved      PT LONG TERM GOAL #2   Title  Patient tolerates prosthesis wear >80% of awake hours without skin issues or limb pain to enable function throughout his day.     Baseline  MET 07/13/18    Time  12    Period  Weeks    Status  Achieved      PT LONG TERM GOAL #3   Title  Berg Balance >20/56 to reduce fall risk.     Baseline  MET 07/13/18  Gar Ponto  and Merrilee Jansky Tasks with RW improved from 32/56 to 46/56    Time  12  Period  Weeks     Status  Achieved      PT LONG TERM GOAL #4   Title  Patient ambulates 300' with LRAD & Prosthesis outdoors including grass modified independent for community mobility.     Baseline  Partially MET 07/15/2018  Patient ambulates 200' with RW & prosthesis modified independent. Distance is limited by RLE circulation.     Time  12    Period  Weeks    Status  Partially Met      PT LONG TERM GOAL #5   Title  Patient negotiates ramps, curbs & stairs with LRAD & prosthesis modified independent to enable community access.     Baseline  MET 07/15/2018    Time  12    Period  Weeks    Status  Achieved      PT LONG TERM GOAL #6   Title  Patient ambulates around furniture carrying cup of water with LRAD & prosthesis modified independent for household mobility.     Baseline  Partially MET 07/15/2018 using walker basket on RW    Time  12    Period  Weeks    Status  Partially Met            Plan - 07/15/18 1644    Clinical Impression Statement  Patient met or partially met all 6 LTGs. He is functioning with transfemoral prosthesis & rolling walker at community level. 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     Rehab Potential  Good    PT Frequency  2x / week    PT Duration  12 weeks    PT Treatment/Interventions  ADLs/Self Care Home Management;DME Instruction;Gait training;Stair training;Canalith Repostioning;Functional mobility training;Therapeutic activities;Therapeutic exercise;Balance training;Neuromuscular re-education;Patient/family education;Prosthetic Training;Vestibular    PT Next Visit Plan  discharge    Consulted and Agree with Plan of Care  Patient    Family Member Consulted  wife, Benjamine Mola       Patient will benefit from skilled therapeutic intervention in order to improve the following deficits and impairments:  Abnormal gait, Decreased activity tolerance, Decreased balance, Decreased endurance, Decreased  knowledge of use of DME, Decreased mobility, Impaired flexibility, Decreased strength, Postural dysfunction, Prosthetic Dependency  Visit Diagnosis: Unsteadiness on feet  Other abnormalities of gait and mobility  Abnormal posture  Muscle weakness (generalized)     Problem List Patient Active Problem List   Diagnosis Date Noted  . Pain of right lower extremity   . Type 2 diabetes mellitus with peripheral neuropathy (HCC)   . Benign essential HTN   . Coronary artery disease involving coronary bypass graft of native heart without angina pectoris   . Chronic combined systolic and diastolic congestive heart failure ([place])   . Leukocytosis   . Transaminitis   . Unilateral AKA, right ([place])   . Cardiac arrest ([place])   . Bradycardia 12/06/2017  . History of atherosclerotic cardiovascular disease 12/06/2017  . Diabetic foot infection ([place]) 11/28/2017  . Acute CHF (congestive heart failure) ([place]) 11/28/2017  . Rectal bleeding 10/04/2013  . Heme positive stool 10/04/2013  . Community acquired pneumonia 07/30/2013  . DM type 2 (diabetes mellitus, type 2) ([place]) 07/30/2013  . HTN (hypertension), benign 07/30/2013  . CAROTID BRUITS, BILATERAL 07/16/2010  . MITRAL VALVE PROLAPSE 08/22/2009  . DM 07/22/2009  . Atherosclerotic cardiovascular disease 07/22/2009  . 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 ERECTILE DYSFUNCTION 05/27/2008  . INGUINAL HERNIA, RIGHT 10/19/2007  .  ANEMIA, NORMOCYTIC 10/09/2007  . Dyslipidemia 12/19/2006  . PERIPHERAL NEUROPATHY 12/19/2006  . Unspecified glaucoma 12/19/2006  . Essential hypertension 12/19/2006  . MYOCARDIAL INFARCTION, HX OF 12/19/2006  . PERIPHERAL VASCULAR DISEASE 12/19/2006     PHYSICAL THERAPY DISCHARGE SUMMARY  Visits from Start of Care: 20  Current functional level related to goals / functional outcomes: PT Long Term Goals - 07/15/18 1640      PT LONG TERM GOAL #1   Title  Patient verbalizes & demonstrates proper prosthetic  care to enable safe use of prosthesis. (All LTGs Target Date: 07/17/2018)    Baseline  MET 07/13/18    Time  12    Period  Weeks    Status  Achieved      PT LONG TERM GOAL #2   Title  Patient tolerates prosthesis wear >80% of awake hours without skin issues or limb pain to enable function throughout his day.     Baseline  MET 07/13/18    Time  12    Period  Weeks    Status  Achieved      PT LONG TERM GOAL #3   Title  Berg Balance >20/56 to reduce fall risk.     Baseline  MET 07/13/18  Gar Ponto  and Merrilee Jansky Tasks with RW improved from 32/56 to 46/56    Time  12    Period  Weeks    Status  Achieved      PT LONG TERM GOAL #4   Title  Patient ambulates 300' with LRAD & Prosthesis outdoors including grass modified independent for community mobility.     Baseline  Partially MET 07/15/2018  Patient ambulates 200' with RW & prosthesis modified independent. Distance is limited by RLE circulation.     Time  12    Period  Weeks    Status  Partially Met      PT LONG TERM GOAL #5   Title  Patient negotiates ramps, curbs & stairs with LRAD & prosthesis modified independent to enable community access.     Baseline  MET 07/15/2018    Time  12    Period  Weeks    Status  Achieved      PT LONG TERM GOAL #6   Title  Patient ambulates around furniture carrying cup of water with LRAD & prosthesis modified independent for household mobility.     Baseline  Partially MET 07/15/2018 using walker basket on RW    Time  12    Period  Weeks    Status  Partially Met        Remaining deficits: Patient is dependent on walker for gait and limited distance by left leg circulation. Balance is dependent on walker.    Education / Equipment: Prosthetic care  Plan: Patient agrees to discharge.  Patient goals were partially met. Patient is being discharged due to meeting the stated rehab goals.  ?????           Nesta Scaturro PT, DPT 07/16/2018, 6:46 AM  [facility] [address] [place], [place], [address] Phone: [phone]   Fax:  [phone]  Name: Marcus Hendrix MRN: [id number] Date of Birth: 1941/01/04

## 2019-07-26 ENCOUNTER — Encounter (INDEPENDENT_AMBULATORY_CARE_PROVIDER_SITE_OTHER): Payer: Self-pay | Admitting: *Deleted

## 2019-08-17 IMAGING — CR DG FOOT COMPLETE 3+V*R*
1 series · 3 of 3 positions shown · non-contrast
Comparison: None.

CLINICAL DATA: Right foot pain and swelling.

EXAM:
RIGHT FOOT COMPLETE - 3+ VIEW

[Series 1: ap · 0.17mm/px · 3 of 3 slices shown]
[im 1/3]
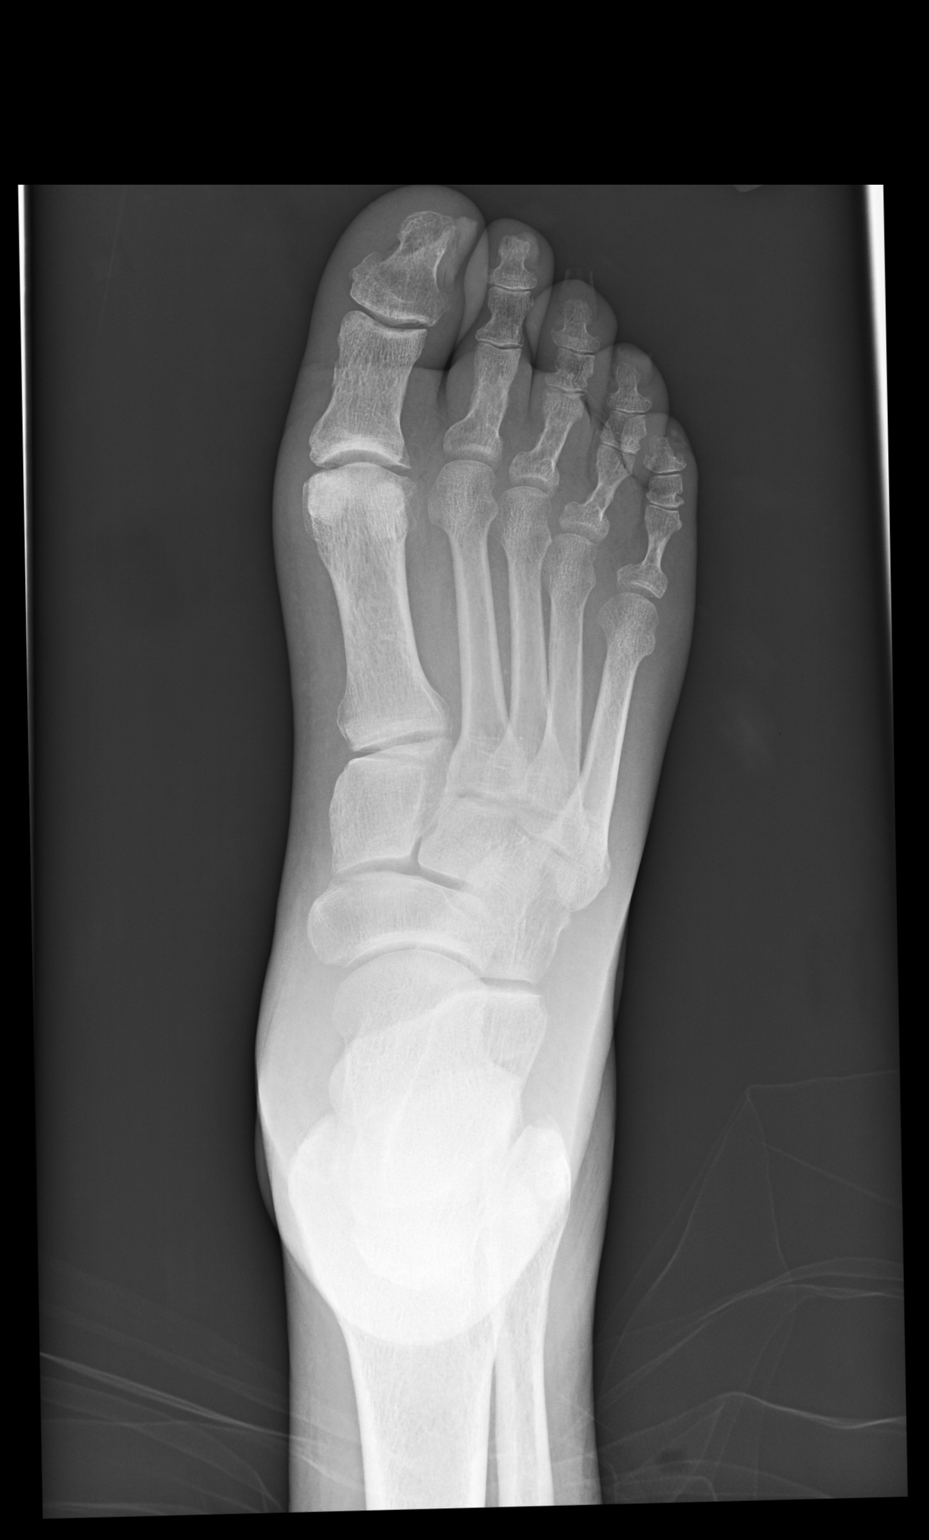
[im 2/3]
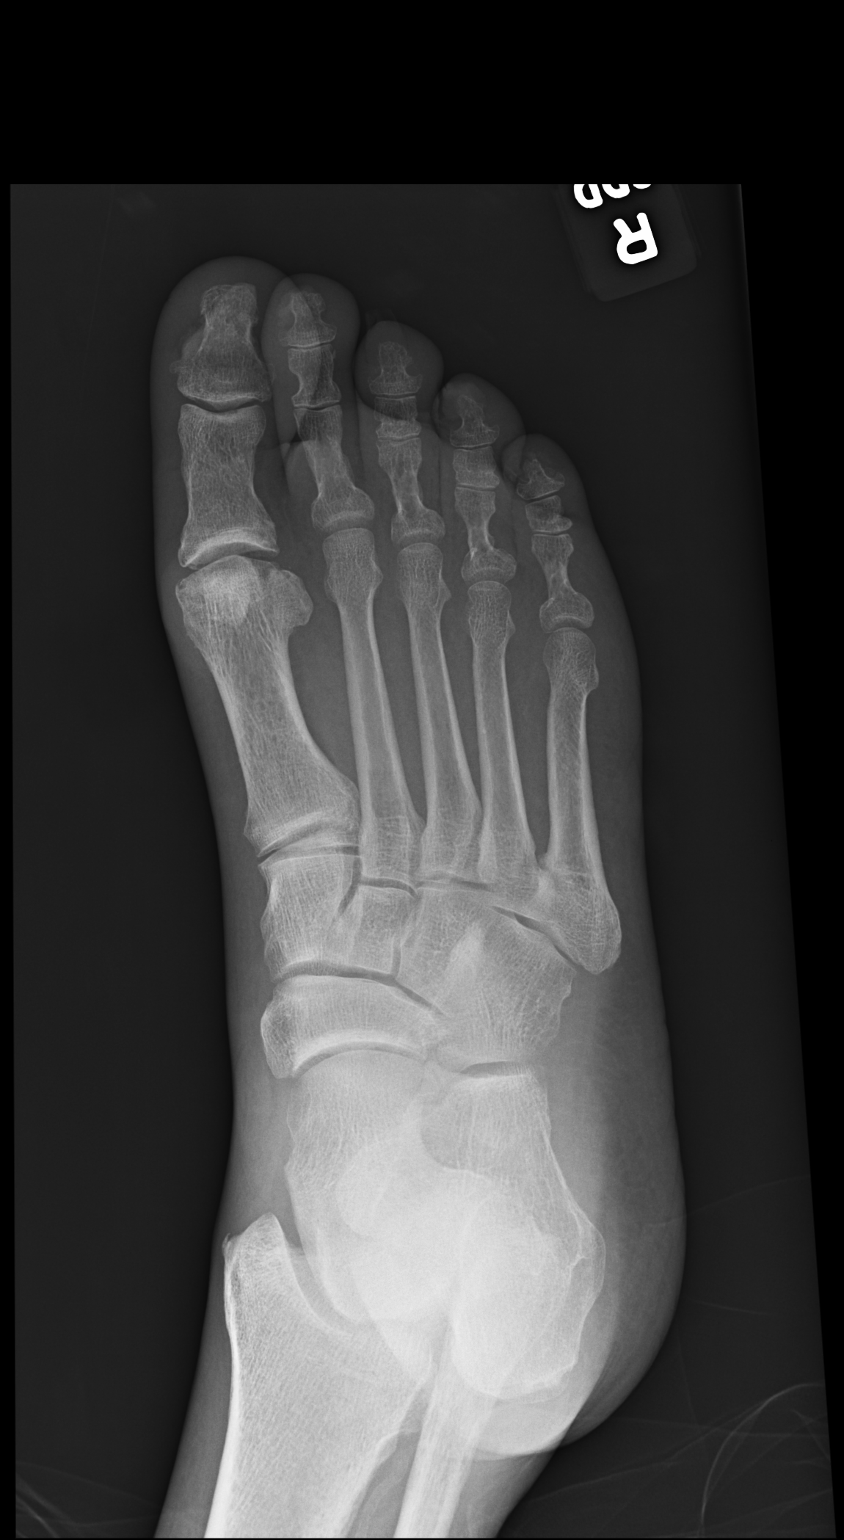
[im 3/3]
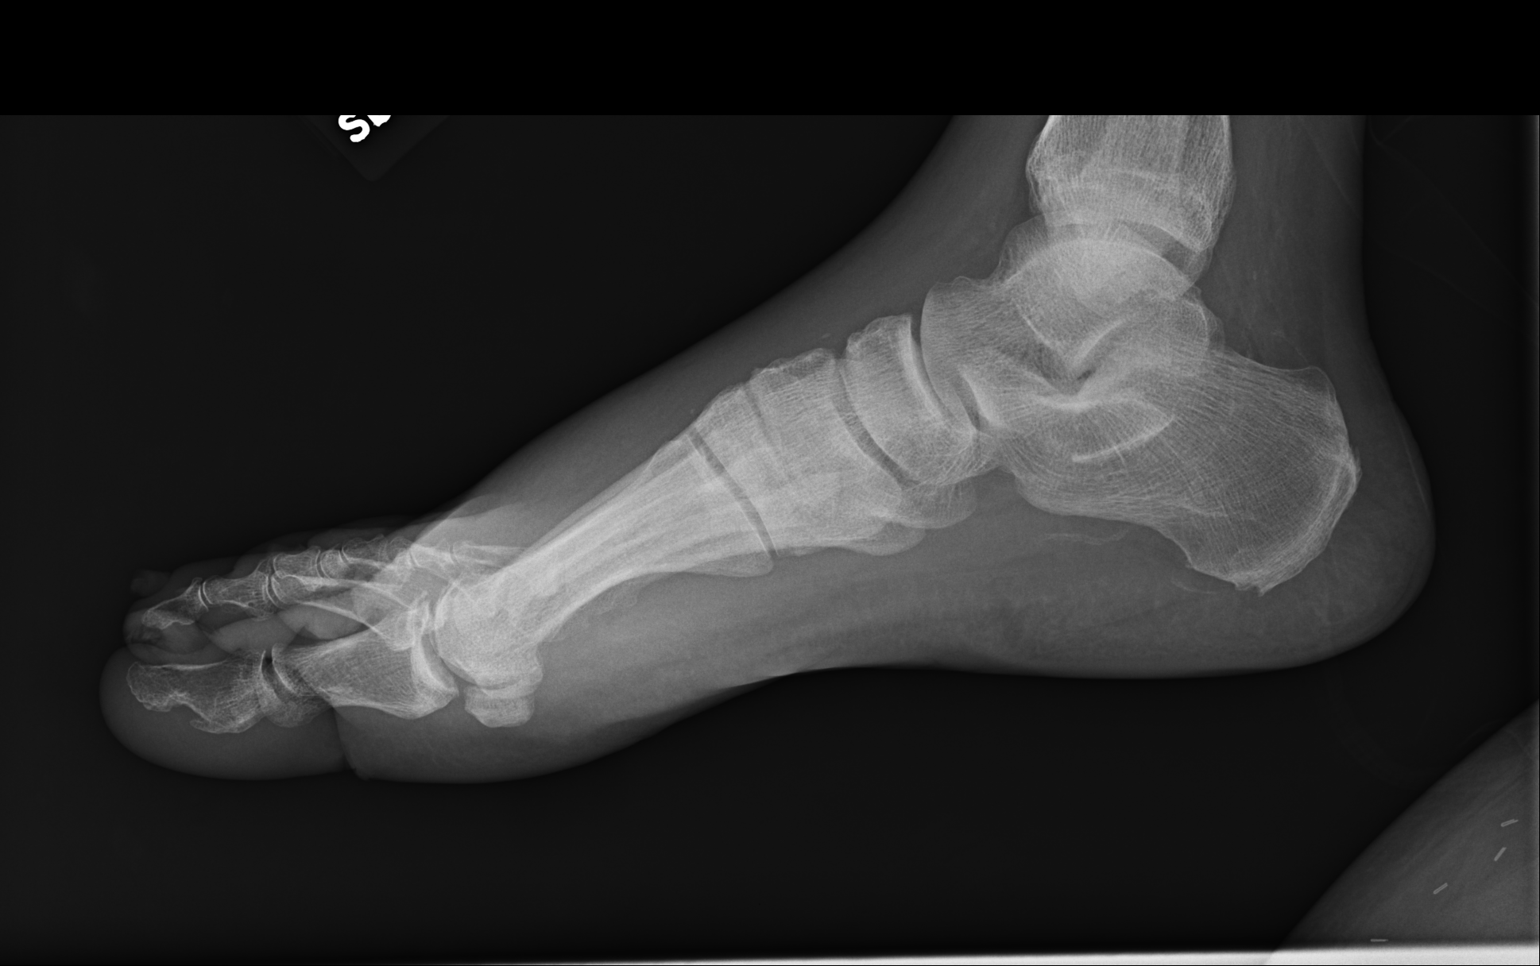

[3 of 3 positions shown; findings below may reference images not displayed]

FINDINGS: There is no evidence of fracture or dislocation. No focal bone
erosions. Mild hallux valgus deformity with degenerative changes at
the first MTP joint. Soft tissues are unremarkable.
IMPRESSION: No acute bone abnormalities.

## 2019-09-10 ENCOUNTER — Emergency Department (HOSPITAL_COMMUNITY)
Admission: EM | Admit: 2019-09-10 | Discharge: 2019-09-10 | Disposition: A | Discharge status: Home / Self Care | Payer: Medicare Other | Attending: Emergency Medicine | Admitting: Emergency Medicine

## 2019-09-10 ENCOUNTER — Emergency Department (HOSPITAL_COMMUNITY): Payer: Medicare Other

## 2019-09-10 ENCOUNTER — Other Ambulatory Visit: Payer: Self-pay

## 2019-09-10 ENCOUNTER — Encounter (HOSPITAL_COMMUNITY): Payer: Self-pay | Admitting: *Deleted

## 2019-09-10 DIAGNOSIS — Z794 Long term (current) use of insulin: Secondary | ICD-10-CM | POA: Diagnosis not present

## 2019-09-10 DIAGNOSIS — R059 Cough, unspecified: Secondary | ICD-10-CM

## 2019-09-10 DIAGNOSIS — Z7982 Long term (current) use of aspirin: Secondary | ICD-10-CM | POA: Insufficient documentation

## 2019-09-10 DIAGNOSIS — R05 Cough: Secondary | ICD-10-CM | POA: Diagnosis not present

## 2019-09-10 DIAGNOSIS — Z87891 Personal history of nicotine dependence: Secondary | ICD-10-CM | POA: Diagnosis not present

## 2019-09-10 DIAGNOSIS — R0602 Shortness of breath: Secondary | ICD-10-CM | POA: Insufficient documentation

## 2019-09-10 DIAGNOSIS — E119 Type 2 diabetes mellitus without complications: Secondary | ICD-10-CM | POA: Diagnosis not present

## 2019-09-10 DIAGNOSIS — I10 Essential (primary) hypertension: Secondary | ICD-10-CM | POA: Insufficient documentation

## 2019-09-10 DIAGNOSIS — Z79899 Other long term (current) drug therapy: Secondary | ICD-10-CM | POA: Diagnosis not present

## 2019-09-10 HISTORY — DX: Acquired absence of right leg below knee: Z89.511

## 2019-09-10 LAB — CBC WITH DIFFERENTIAL/PLATELET
Abs Immature Granulocytes: 0.04 10*3/uL (ref 0.00–0.07)
Basophils Absolute: 0 10*3/uL (ref 0.0–0.1)
Basophils Relative: 1 %
Eosinophils Absolute: 0.3 10*3/uL (ref 0.0–0.5)
Eosinophils Relative: 4 %
HCT: 37.6 % — ABNORMAL LOW (ref 39.0–52.0)
Hemoglobin: 11.5 g/dL — ABNORMAL LOW (ref 13.0–17.0)
Immature Granulocytes: 1 %
Lymphocytes Relative: 21 %
Lymphs Abs: 1.8 10*3/uL (ref 0.7–4.0)
MCH: 30.1 pg (ref 26.0–34.0)
MCHC: 30.6 g/dL (ref 30.0–36.0)
MCV: 98.4 fL (ref 80.0–100.0)
Monocytes Absolute: 0.5 10*3/uL (ref 0.1–1.0)
Monocytes Relative: 5 %
Neutro Abs: 6.1 10*3/uL (ref 1.7–7.7)
Neutrophils Relative %: 68 %
Platelets: 180 10*3/uL (ref 150–400)
RBC: 3.82 MIL/uL — ABNORMAL LOW (ref 4.22–5.81)
RDW: 13.2 % (ref 11.5–15.5)
WBC: 8.7 10*3/uL (ref 4.0–10.5)
nRBC: 0 % (ref 0.0–0.2)

## 2019-09-10 LAB — TROPONIN I (HIGH SENSITIVITY)
Troponin I (High Sensitivity): 8 ng/L (ref <18)
Troponin I (High Sensitivity): 9 ng/L (ref <18)

## 2019-09-10 LAB — COMPREHENSIVE METABOLIC PANEL
ALT: 22 U/L (ref 0–44)
AST: 27 U/L (ref 15–41)
Albumin: 4.2 g/dL (ref 3.5–5.0)
Alkaline Phosphatase: 118 U/L (ref 38–126)
Anion gap: 7 (ref 5–15)
BUN: 11 mg/dL (ref 8–23)
CO2: 25 mmol/L (ref 22–32)
Calcium: 8.7 mg/dL — ABNORMAL LOW (ref 8.9–10.3)
Chloride: 105 mmol/L (ref 98–111)
Creatinine, Ser: 0.79 mg/dL (ref 0.61–1.24)
GFR calc Af Amer: >60 mL/min (ref >60)
GFR calc non Af Amer: >60 mL/min (ref >60)
Glucose, Bld: 151 mg/dL — ABNORMAL HIGH (ref 70–99)
Potassium: 4 mmol/L (ref 3.5–5.1)
Sodium: 137 mmol/L (ref 135–145)
Total Bilirubin: 1.9 mg/dL — ABNORMAL HIGH (ref 0.3–1.2)
Total Protein: 7.3 g/dL (ref 6.5–8.1)

## 2019-09-10 LAB — BRAIN NATRIURETIC PEPTIDE: B Natriuretic Peptide: 380 pg/mL — ABNORMAL HIGH (ref 0.0–100.0)

## 2019-09-10 MED ORDER — ALBUTEROL SULFATE HFA 108 (90 BASE) MCG/ACT IN AERS
4.0000 | INHALATION_SPRAY | Freq: Once | RESPIRATORY_TRACT | Status: AC
  Administered 2019-09-10: 16:00:00 4 via RESPIRATORY_TRACT
  Filled 2019-09-10: qty 6.7

## 2019-09-10 NOTE — Discharge Instructions (Addendum)
Your x-ray today show some mild pleural effusions.  Your laboratory results were within normal limits.  We provided an albuterol inhaler for you to use to help with your shortness of breath.  You may also obtain some over-the-counter Mucinex DM to help with cough.  If you experience any chest pain, shortness of breath or worsening symptoms please return to the emergency department.

## 2019-09-10 NOTE — ED Triage Notes (Signed)
Pt c/o SOB, chest congestion, dyspnea with exertion x 1 week. Pt denies SOB at rest.

## 2019-09-10 NOTE — ED Provider Notes (Signed)
[facility] EMERGENCY DEPARTMENT Provider Note   CSN: [id number] Arrival date & time: 09/10/19  [id number]     History   Chief Complaint Chief Complaint  Patient presents with  . Shortness of Breath    HPI Marcus Hendrix is a 78 y.o. male.     78 y.o male with a PMH of DM, R BKA, HTN,CHF, MI presents to the ED with a chief complaint of shortness of breath and chest tightness x 1 week. Patient reports a tightness sensation to the middle of his chest with no radiation.  This is worse with exertion, reports he feels like "cannot cough up anything ".  He also endorses some shortness of breath, this is worse with ambulation, states "feel like I cannot breathe good ".  There is no alleviating factors although he has taken some Tylenol which has helped some with the discomfort.  He does not endorse a cough, denies any sore throat.  Patient also endorses wheezing throughout the night, has not attempted to use inhaler or any therapy. He denies any fever, sick contacts, chills, or abdominal pain.   The history is provided by the patient.  Shortness of Breath Severity:  Moderate Onset quality:  Gradual Duration:  1 week Timing:  Constant Progression:  Unchanged Chronicity:  Recurrent Context: activity   Relieved by:  NSAIDs Worsened by:  Movement and activity Associated symptoms: chest pain and wheezing   Associated symptoms: no abdominal pain, no cough, no fever, no headaches, no neck pain, no sore throat, no sputum production, no swollen glands and no vomiting     Past Medical History:  Diagnosis Date  . Cerebrovascular disease   . Diabetes mellitus   . Diabetes mellitus ([place])   . ED (erectile dysfunction)   . Glaucoma, left eye   . History of atherosclerotic cardiovascular disease   . History of myocardial infarction   . Hx of right BKA ([place])   . Hyperlipidemia   . Hypertension   . Inguinal hernia    Right  . Normocytic anemia   . Peripheral neuropathy   . PVD (peripheral  vascular disease) ([place])   . Tubulovillous adenoma of colon     Patient Active Problem List   Diagnosis Date Noted  . Pain of right lower extremity   . Type 2 diabetes mellitus with peripheral neuropathy (HCC)   . Benign essential HTN   . Coronary artery disease involving coronary bypass graft of native heart without angina pectoris   . Chronic combined systolic and diastolic congestive heart failure ([place])   . Leukocytosis   . Transaminitis   . Unilateral AKA, right ([place])   . Cardiac arrest ([place])   . Bradycardia 12/06/2017  . History of atherosclerotic cardiovascular disease 12/06/2017  . Diabetic foot infection ([place]) 11/28/2017  . Acute CHF (congestive heart failure) ([place]) 11/28/2017  . Rectal bleeding 10/04/2013  . Heme positive stool 10/04/2013  . Community acquired pneumonia 07/30/2013  . DM type 2 (diabetes mellitus, type 2) ([place]) 07/30/2013  . HTN (hypertension), benign 07/30/2013  . CAROTID BRUITS, BILATERAL 07/16/2010  . MITRAL VALVE PROLAPSE 08/22/2009  . DM 07/22/2009  . Atherosclerotic cardiovascular disease 07/22/2009  . 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 ERECTILE DYSFUNCTION 05/27/2008  . INGUINAL HERNIA, RIGHT 10/19/2007  . ANEMIA, NORMOCYTIC 10/09/2007  . Dyslipidemia 12/19/2006  . PERIPHERAL NEUROPATHY 12/19/2006  . Unspecified glaucoma 12/19/2006  . Essential hypertension 12/19/2006  . MYOCARDIAL INFARCTION, HX OF 12/19/2006  . PERIPHERAL VASCULAR DISEASE 12/19/2006  Past Surgical History:  Procedure Laterality Date  . AMPUTATION Right 12/12/2017   Procedure: AMPUTATION ABOVE KNEE RIGHT;  Surgeon: Serafina Mitchell, MD;  Location: [facility];  Service: Vascular;  Laterality: Right;  . CHOLECYSTECTOMY    . COLONOSCOPY [name] 07/22/2014   Procedure: COLONOSCOPY;  Surgeon: [name], MD;  Location[facility];  Service: Endoscopy;  Laterality: [name];  1235-moved to [place] notified pt  . CORONARY ARTERY BYPASS GRAFT  2002   x6  . INGUINAL HERNIA  REPAIR  2004   Left  . LOWER EXTREMITY ANGIOGRAPHY [name] 12/08/2017   Procedure: LOWER EXTREMITY ANGIOGRAPHY;  Surgeon: Elam Dutch, MD;  Location: [facility];  Service: Cardiovascular;  Laterality: [name];  . PERIPHERAL VASCULAR INTERVENTION Bilateral 12/08/2017   Procedure: PERIPHERAL VASCULAR INTERVENTION;  Surgeon: Elam Dutch, MD;  Location: [facility];  Service: Cardiovascular;  Laterality: Bilateral;  [place]        Home Medications    Prior to Admission medications   Medication Sig Start Date End Date Taking? Authorizing Provider  Amino Acids-Protein Hydrolys (FEEDING SUPPLEMENT, PRO-STAT SUGAR FREE 64,) LIQD Take 30 mLs by mouth 2 (two) times daily. 12/19/17  Yes Lavina Hamman, MD  amLODipine (NORVASC) 10 MG tablet Take 10 mg by mouth daily.     Yes [provider]  aspirin 81 MG tablet Take 81 mg by mouth daily.   Yes [provider]  atorvastatin (LIPITOR) 80 MG tablet TAKE (1) TABLET BY MOUTH ONCE DAILY. Patient taking differently: Take 80 mg by mouth daily.  01/16/18  Yes Arnoldo Lenis, MD  carvedilol (COREG) 12.5 MG tablet Take 1 tablet (12.5 mg total) by mouth 2 (two) times daily with a meal. 12/19/17  Yes Lavina Hamman, MD  furosemide (LASIX) 40 MG tablet Take 40 mg by mouth daily.     Yes [provider]  gabapentin (NEURONTIN) 100 MG capsule Take 2 capsules (200 mg total) by mouth 3 (three) times daily. 12/19/17  Yes Lavina Hamman, MD  insulin glargine (LANTUS) 100 UNIT/ML injection Inject 0.15 mLs (15 Units total) into the skin every morning. 12/19/17  Yes Lavina Hamman, MD  lisinopril (PRINIVIL,ZESTRIL) 20 MG tablet TAKE ONE TABLET BY MOUTH TWICE DAILY. 01/18/14  Yes Lendon Colonel, NP  metoprolol tartrate (LOPRESSOR) 100 MG tablet Take 1 tablet by mouth 2 (two) times daily. 11/14/17  Yes [provider]  polyethylene glycol powder (GLYCOLAX/MIRALAX) powder Take 17 g by mouth daily. 11/29/17  Yes Johnson,  Clanford L, MD  potassium chloride SA (K-DUR,KLOR-CON) 20 MEQ tablet Take 20 mEq by mouth daily.   Yes [provider]  timolol (TIMOPTIC) 0.25 % ophthalmic solution Place 1 drop into the left eye every morning.    Yes [provider]  vitamin B-12 (CYANOCOBALAMIN) 1000 MCG tablet Take 2,000 mcg by mouth daily.   Yes [provider]  cephALEXin (KEFLEX) 500 MG capsule Take 1 capsule by mouth 4 (four) times daily. 12/03/17   [provider]  guaiFENesin-dextromethorphan (ROBITUSSIN DM) 100-10 MG/5ML syrup Take 5 mLs by mouth every 4 (four) hours as needed for cough. 11/29/17   Johnson, Clanford L, MD  Hydrocortisone (GERHARDT'S BUTT CREAM) CREA Apply 1 application topically 3 (three) times daily. 12/19/17   Lavina Hamman, MD  nitroGLYCERIN (NITROSTAT) 0.4 MG SL tablet Place 0.4 mg under the tongue every 5 (five) minutes as needed for chest pain.  04/15/16   [provider]  oxyCODONE-acetaminophen (PERCOCET/ROXICET) 5-325 MG tablet Take 1 tablet by mouth every 6 (six) hours as needed for severe pain. 12/19/17   Lavina Hamman, MD    Family History Family History  Problem Relation Age of Onset  . Heart attack Mother   . Stroke Father   . Diabetes Sister   . HIV Brother   . Alcohol abuse Brother   . Diabetes Sister   . Heart attack Son     Social History Social History   Tobacco Use  . Smoking status: Former Smoker    Packs/day: 1.00    Years: 41.00    Pack years: 41.00    Types: Cigarettes    Start date: 02/06/1957    Quit date: 12/16/1997    Years since quitting: 21.7  . Smokeless tobacco: Current User    Types: Chew  . Tobacco comment: Chews tobacco occasionally  Substance Use Topics  . Alcohol use: No    Alcohol/week: 0.0 standard drinks  . Drug use: No     Allergies   Bidil [isosorb dinitrate-hydralazine]   Review of Systems Review of Systems  Constitutional: Negative for fever.  HENT: Negative for rhinorrhea, sinus pressure  and sore throat.   Eyes: Negative for redness.  Respiratory: Positive for shortness of breath and wheezing. Negative for cough and sputum production.   Cardiovascular: Positive for chest pain.  Gastrointestinal: Negative for abdominal pain and vomiting.  Genitourinary: Negative for flank pain.  Musculoskeletal: Negative for neck pain.  Skin: Negative for wound.  Neurological: Negative for headaches.     Physical Exam Updated Vital Signs BP (!) 189/66 (BP Location: Right Arm)   Pulse 63   Temp 98.3 F (36.8 C) (Oral)   Resp 18   Ht 6\' 2"  (1.88 m)   Wt 88.5 kg   SpO2 92%   BMI 25.04 kg/m   Physical Exam Vitals signs and nursing note reviewed.  Constitutional:      Appearance: He is well-developed. He is not ill-appearing.  HENT:     Head: Normocephalic and atraumatic.  Eyes:     General: No scleral icterus.    Pupils: Pupils are equal, round, and reactive to light.  Neck:     Musculoskeletal: Normal range of motion.  Cardiovascular:     Heart sounds: Normal heart sounds.  Pulmonary:     Effort: Pulmonary effort is normal.     Breath sounds: Examination of the right-middle field reveals wheezing. Examination of the right-lower field reveals wheezing. Decreased breath sounds and wheezing present.  Chest:     Chest wall: No tenderness.  Abdominal:     General: Bowel sounds are normal. There is no distension.     Palpations: Abdomen is soft.     Tenderness: There is no abdominal tenderness.  Musculoskeletal:        General: No tenderness or deformity.     Left lower leg: 1+ Edema present.  Feet:     Right foot:     Amputation: Right leg is amputated below knee.  Skin:    General: Skin is warm and dry.  Neurological:     Mental Status: He is alert and oriented to person, place, and time.      ED Treatments / Results  Labs (all labs ordered are listed, but only abnormal results are displayed) Labs Reviewed  CBC WITH DIFFERENTIAL/PLATELET - Abnormal; Notable  for the following components:      Result Value   RBC 3.82 (*)  Hemoglobin 11.5 (*)    HCT 37.6 (*)    All other components within normal limits  COMPREHENSIVE METABOLIC PANEL - Abnormal; Notable for the following components:   Glucose, Bld 151 (*)    Calcium 8.7 (*)    Total Bilirubin 1.9 (*)    All other components within normal limits  BRAIN NATRIURETIC PEPTIDE - Abnormal; Notable for the following components:   B Natriuretic Peptide 380.0 (*)    All other components within normal limits  TROPONIN I (HIGH SENSITIVITY)  TROPONIN I (HIGH SENSITIVITY)    EKG None  Radiology Dg Chest 2 View  Result Date: 09/10/2019 CLINICAL DATA:  Short of breath EXAM: CHEST - 2 VIEW COMPARISON:  12/25/2017 FINDINGS: Cardiac enlargement without heart failure or edema. Atherosclerotic aortic arch. Small bilateral pleural effusions and bibasilar atelectasis. No definite pneumonia. IMPRESSION: Small bilateral effusions.  Negative for edema Mild bibasilar atelectasis. Electronically Signed   By: Franchot Gallo M.D.   On: 09/10/2019 11:22    Procedures Procedures (including critical care time)  Medications Ordered in ED Medications  albuterol (VENTOLIN HFA) 108 (90 Base) MCG/ACT inhaler 4 puff (4 puffs Inhalation Given 09/10/19 1545)     Initial Impression / Assessment and Plan / ED Course  I have reviewed the triage vital signs and the nursing notes.  Pertinent labs & imaging results that were available during my care of the patient were reviewed by me and considered in my medical decision making (see chart for details).  Clinical Course as of Sep 09 1714  Fri Sep 10, 2019  1706 Troponin I (High Sensitivity): 9 [JS]    Clinical Course User Index [JS] Janeece Fitting, [place]     Patient with extensive past medical history including hypertension, MI, asthma presents to the ED with complaints of shortness of breath along with chest tightness x1 week.  Patient endorses increased work of breathing  worse with ambulation, reports tightness to the center of his chest this is worse with movement.  Has been taking some Tylenol without improvement in symptoms.  He does not have a cough, no sputum, denies any sick contacts.  Currently on blood thinners.  Reports he has not used an inhaler in the past.  During arrival patient's O2 saturation was 93% on room air, he denies any supplemental oxygen while at home. BC without any leukocytosis, hemoglobin slightly diminished.  CMP showed no electrolyte derangement, glucose is slightly elevated at 151 does have a previous history of diabetes.  LFTs are within normal limits.  BNP was 380 today, this is actually improved from his previous records.  X-ray showed: Small bilateral effusions. Negative for edema    Mild bibasilar atelectasis.   First troponin was 8, delta was 9, low suspicion for ACS as patient is a well-appearing, denies any pressure to his chest aside from chest tightness with coughing..  Patient was provided here with an albuterol inhaler.   5:14 PM patient was reassessed after using his inhaler, reports improvement in symptoms, will ambulate with pulse ox prior to disposition.  Of note, patient does have 2 previous visits to the ED satting at 92% on room air, some suspicion that he might benefit for supplemental oxygen while at home, discussed this with him in order to obtain an appointment with his primary care physician.  5:32 PM patient was ambulated by me, he currently ambulates with a walker at home.  Oxygen saturation in the mid 90s, and is consistent with his previous visits.  No tachycardia,  tachypnea.  Patient stable for discharge, return precautions discussed with niece at the bedside.  Portions of this note were generated with Lobbyist. Dictation errors may occur despite best attempts at proofreading.  Final Clinical Impressions(s) / ED Diagnoses   Final diagnoses:  Shortness of breath  Cough    ED Discharge  Orders    None       Janeece Fitting, PA-C 09/10/19 1733    Elnora Morrison, MD 09/13/19 [phone]

## 2020-01-22 ENCOUNTER — Other Ambulatory Visit: Payer: Self-pay

## 2020-01-22 ENCOUNTER — Ambulatory Visit: Payer: Medicare Other | Attending: Internal Medicine

## 2020-01-22 DIAGNOSIS — Z23 Encounter for immunization: Secondary | ICD-10-CM | POA: Insufficient documentation

## 2020-01-22 NOTE — Progress Notes (Signed)
   Covid-19 Vaccination Clinic  Name:  Marcus Hendrix    MRN: [id number] DOB: 08-27-1941  01/22/2020  Marcus Hendrix was observed post Covid-19 immunization for 15 minutes without incidence. He was provided with Vaccine Information Sheet and instruction to access the V-Safe system.   Marcus Hendrix was instructed to call 911 with any severe reactions post vaccine: [name] Difficulty breathing  . Swelling of your face and throat  . A fast heartbeat  . A bad rash all over your body  . Dizziness and weakness    Immunizations Administered    Name Date Dose VIS Date Route   Moderna COVID-19 Vaccine 01/22/2020 10:27 AM 0.5 mL 11/16/2019 Intramuscular   Manufacturer: Moderna   Lot: [id number]   [id number]

## 2020-02-22 ENCOUNTER — Ambulatory Visit: Payer: Medicare Other | Attending: Internal Medicine

## 2020-02-22 DIAGNOSIS — Z23 Encounter for immunization: Secondary | ICD-10-CM

## 2020-02-22 NOTE — Progress Notes (Signed)
   Covid-19 Vaccination Clinic  Name:  Marcus Hendrix    MRN: [id number] DOB: 1940-12-31  02/22/2020  Marcus Hendrix was observed post Covid-19 immunization for 30 minutes based on pre-vaccination screening without incident. He was provided with Vaccine Information Sheet and instruction to access the V-Safe system.   Marcus Hendrix was instructed to call 911 with any severe reactions post vaccine: [name] Difficulty breathing  . Swelling of face and throat  . A fast heartbeat  . A bad rash all over body  . Dizziness and weakness   Immunizations Administered    Name Date Dose VIS Date Route   Moderna COVID-19 Vaccine 02/22/2020 10:13 AM 0.5 mL 11/16/2019 Intramuscular   Manufacturer: Moderna   Lot: [id number]   [id number]

## 2020-08-29 ENCOUNTER — Other Ambulatory Visit: Payer: Self-pay

## 2020-08-29 DIAGNOSIS — I739 Peripheral vascular disease, unspecified: Secondary | ICD-10-CM

## 2020-09-11 ENCOUNTER — Other Ambulatory Visit: Payer: Self-pay

## 2020-09-11 ENCOUNTER — Ambulatory Visit (HOSPITAL_COMMUNITY)
Admission: RE | Admit: 2020-09-11 | Discharge: 2020-09-11 | Disposition: A | Discharge status: Home / Self Care | Payer: Medicare Other | Source: Ambulatory Visit | Attending: Family Medicine | Admitting: Family Medicine

## 2020-09-11 ENCOUNTER — Ambulatory Visit (INDEPENDENT_AMBULATORY_CARE_PROVIDER_SITE_OTHER): Payer: Medicare Other | Admitting: Physician Assistant

## 2020-09-11 VITALS — BP 141/58 | HR 56 | Temp 97.4°F | Resp 20 | Ht 74.0 in | Wt 182.1 lb

## 2020-09-11 DIAGNOSIS — I739 Peripheral vascular disease, unspecified: Secondary | ICD-10-CM

## 2020-09-11 NOTE — Progress Notes (Signed)
HISTORY AND PHYSICAL     CC:  follow up. Requesting Provider:  [name], MD  HPI: This is a 79 y.o. male who is here today for follow up for PAD.  He is status post right AKA by Dr. Trula Slade 11/2017.  He also has history of right iliac artery stenting.  He also has a known left SFA occlusion.    Pt was last seen in April 2019 and at that time, he was following up for his right AKA wound check.  He was doing well.  He was to follow up in one year with ABI.  His ABI in 2018 was 0.37.  The pt returns today for pain in his left foot with ingrown toenail.  He states that his left foot starting hurting about 2 weeks ago.  He states that it hurts worse at night and he has to hang is foot off the bed or get up to walk around to help with the pain.  He states before this started, he did not get cramping in his left leg, however, he does walk with a prosthesis on the right leg and walks slow due to this.  He denies any fever or chills.   He states that he does not really have any family to help him and it is just him.    The pt is on a statin for cholesterol management.    The pt is on an aspirin.    Other AC:  none The pt is on BB, ACEI, CCB for hypertension.  The pt does have diabetes. Tobacco hx:  former   Past Medical History:  Diagnosis Date  . Cerebrovascular disease   . Diabetes mellitus   . Diabetes mellitus ([place])   . ED (erectile dysfunction)   . Glaucoma, left eye   . History of atherosclerotic cardiovascular disease   . History of myocardial infarction   . Hx of right BKA ([place])   . Hyperlipidemia   . Hypertension   . Inguinal hernia    Right  . Normocytic anemia   . Peripheral neuropathy   . PVD (peripheral vascular disease) ([place])   . Tubulovillous adenoma of colon     Past Surgical History:  Procedure Laterality Date  . AMPUTATION Right 12/12/2017   Procedure: AMPUTATION ABOVE KNEE RIGHT;  Surgeon: Serafina Mitchell, MD;  Location: [facility];  Service: Vascular;   Laterality: Right;  . CHOLECYSTECTOMY    . COLONOSCOPY [name] 07/22/2014   Procedure: COLONOSCOPY;  Surgeon: [name], MD;  [facility];  Service: Endoscopy;  Laterality: [name];  [id number] to [place] notified pt  . CORONARY ARTERY BYPASS GRAFT  2002   x6  . INGUINAL HERNIA REPAIR  2004   Left  . LOWER EXTREMITY ANGIOGRAPHY [name] 12/08/2017   Procedure: LOWER EXTREMITY ANGIOGRAPHY;  Surgeon: Elam Dutch, MD;  Location: [facility];  Service: Cardiovascular;  Laterality: [name];  . PERIPHERAL VASCULAR INTERVENTION Bilateral 12/08/2017   Procedure: PERIPHERAL VASCULAR INTERVENTION;  Surgeon: Elam Dutch, MD;  Location: [facility];  Service: Cardiovascular;  Laterality: Bilateral;  [place]    Allergies  Allergen Reactions  . Bidil [Isosorb Dinitrate-Hydralazine]     PASSED OUT BUT NOT SURE IF THIS WAS THE CAUSE    Current Outpatient Medications  Medication Sig Dispense Refill  . Amino Acids-Protein Hydrolys (FEEDING SUPPLEMENT, PRO-STAT SUGAR FREE 64,) LIQD Take 30 mLs by mouth 2 (two) times daily. 900 mL 0  . amLODipine (  NORVASC) 10 MG tablet Take 10 mg by mouth daily.      [name] aspirin 81 MG tablet Take 81 mg by mouth daily.    [name] atorvastatin (LIPITOR) 80 MG tablet TAKE (1) TABLET BY MOUTH ONCE DAILY. (Patient taking differently: Take 80 mg by mouth daily. ) 90 tablet 0  . carvedilol (COREG) 12.5 MG tablet Take 1 tablet (12.5 mg total) by mouth 2 (two) times daily with a meal. 60 tablet 0  . cephALEXin (KEFLEX) 500 MG capsule Take 1 capsule by mouth 4 (four) times daily.  0  . doxazosin (CARDURA) 4 MG tablet SMARTSIG:1 Tablet(s) By Mouth Every Evening    . furosemide (LASIX) 40 MG tablet Take 40 mg by mouth daily.      [name] gabapentin (NEURONTIN) 100 MG capsule Take 2 capsules (200 mg total) by mouth 3 (three) times daily. 180 capsule 0  . guaiFENesin-dextromethorphan (ROBITUSSIN DM) 100-10 MG/5ML syrup Take 5 mLs by mouth every 4 (four) hours as needed for  cough. 118 mL 0  . Hydrocortisone (GERHARDT'S BUTT CREAM) CREA Apply 1 application topically 3 (three) times daily. 1 each 0  . insulin glargine (LANTUS) 100 UNIT/ML injection Inject 0.15 mLs (15 Units total) into the skin every morning. 10 mL 0  . lisinopril (PRINIVIL,ZESTRIL) 20 MG tablet TAKE ONE TABLET BY MOUTH TWICE DAILY. 90 tablet 3  . metoprolol tartrate (LOPRESSOR) 100 MG tablet Take 1 tablet by mouth 2 (two) times daily.  2  . nitroGLYCERIN (NITROSTAT) 0.4 MG SL tablet Place 0.4 mg under the tongue every 5 (five) minutes as needed for chest pain.   10  . oxyCODONE-acetaminophen (PERCOCET/ROXICET) 5-325 MG tablet Take 1 tablet by mouth every 6 (six) hours as needed for severe pain. 12 tablet 0  . polyethylene glycol powder (GLYCOLAX/MIRALAX) powder Take 17 g by mouth daily. 255 g 0  . potassium chloride SA (K-DUR,KLOR-CON) 20 MEQ tablet Take 20 mEq by mouth daily.    . timolol (TIMOPTIC) 0.25 % ophthalmic solution Place 1 drop into the left eye every morning.     . vitamin B-12 (CYANOCOBALAMIN) 1000 MCG tablet Take 2,000 mcg by mouth daily.    . timolol (TIMOPTIC) 0.5 % ophthalmic solution Place 1 drop into the left eye at bedtime.     No current facility-administered medications for this visit.    Family History  Problem Relation Age of Onset  . Heart attack Mother   . Stroke Father   . Diabetes Sister   . HIV Brother   . Alcohol abuse Brother   . Diabetes Sister   . Heart attack Son     Social History   Socioeconomic History  . Marital status: Widowed    Spouse name: Not on file  . Number of children: Not on file  . Years of education: Not on file  . Highest education level: Not on file  Occupational History  . Occupation: Retired Camera operator at [employer]  . Smoking status: Former Smoker    Packs/day: 1.00    Years: 41.00    Pack years: 41.00    Types: Cigarettes    Start date: 02/06/1957    Quit date: 12/16/1997    Years since quitting: 22.7  .  Smokeless tobacco: Current User    Types: Chew  . Tobacco comment: Chews tobacco occasionally  Vaping Use  . Vaping Use: Never used  Substance and Sexual Activity  . Alcohol use: No    Alcohol/week: 0.0 standard  drinks  . Drug use: No  . Sexual activity: Not on file  Other Topics Concern  . Not on file  Social History Narrative   Lives with wife Rudene Christians and dog Brand Males 4th grade, can read and write some   Social Determinants of Health   Financial Resource Strain:   . Difficulty of Paying Living Expenses: Not on file  Food Insecurity:   . Worried About Charity fundraiser in the Last Year: Not on file  . Ran Out of Food in the Last Year: Not on file  Transportation Needs:   . Lack of Transportation (Medical): Not on file  . Lack of Transportation (Non-Medical): Not on file  Physical Activity:   . Days of Exercise per Week: Not on file  . Minutes of Exercise per Session: Not on file  Stress:   . Feeling of Stress : Not on file  Social Connections:   . Frequency of Communication with Friends and Family: Not on file  . Frequency of Social Gatherings with Friends and Family: Not on file  . Attends Religious Services: Not on file  . Active Member of Clubs or Organizations: Not on file  . Attends Archivist Meetings: Not on file  . Marital Status: Not on file  Intimate Partner Violence:   . Fear of Current or Ex-Partner: Not on file  . Emotionally Abused: Not on file  . Physically Abused: Not on file  . Sexually Abused: Not on file     REVIEW OF SYSTEMS:   [X]  denotes positive finding, [ ]  denotes negative finding Cardiac  Comments:  Chest pain or chest pressure:    Shortness of breath upon exertion:        Vascular    Pain in calf, thigh, or hip brought on by ambulation:    Pain in feet at night that wakes you up from your sleep:  x       Pulmonary    Wheezing:         Neurologic    Hx stroke         Gastrointestinal    Blood in stool:       Vomited blood:         Genitourinary    Blood in urine:        Psychiatric    Major depression:         Hematologic    Bleeding problems:    Problems with blood clotting too easily:        Skin    Rashes or ulcers:        Constitutional    Fever or chills:      PHYSICAL EXAMINATION:  Today's Vitals   09/11/20 0913  BP: (!) 141/58  Pulse: (!) 56  Resp: 20  Temp: (!) 97.4 F (36.3 C)  TempSrc: Temporal  SpO2: 100%  Weight: 182 lb 1.6 oz (82.6 kg)  Height: 6\' 2"  (1.88 m)   Body mass index is 23.38 kg/m.   General:  WDWN in NAD; vital signs documented above Gait: slow with walker HENT: WNL, normocephalic Pulmonary: normal non-labored breathing , without wheezing Cardiac: regular HR Skin: without rashes Vascular Exam/Pulses:  Right Left  Radial 2+ (normal) trace  Femoral 1+ (weak) 1+ (weak)  Popliteal AKA Monophasic doppler  DP AKA absent  PT AKA absent   Extremities: with ischemic changes to the left foot    Musculoskeletal: no muscle wasting or atrophy  Neurologic: A&O  X 3;  No focal weakness or paresthesias are detected Psychiatric:  The pt has Normal affect.   Non-Invasive Vascular Imaging:   ABI's/TBI's on 09/11/2020: Right:  AKA  Left:  0 - Great toe pressure: absent  Previous ABI's/TBI's on 12/08/2017 Left:  0.37     ASSESSMENT/PLAN:: 79 y.o. male here for follow up for PAD now with painful ingrown toenail left great toe  Critical limb ischemia -pt started having rest pain in left foot about 2 weeks ago.  He states he has to hang his foot over the side of the bed or walk around to help his pain.  -discussed with pt that he is at risk for limb loss.  His ABI today is zero and I could not detect doppler signals in the left foot.  He did have a monophasic left popliteal doppler signal.  I discussed with him need for arteriogram with possible intervention given high risk for limb loss.  Also discussed that if this toe becomes infected, he could  also become septic and lose his life.   -after long discussion, pt does not want to proceed with arteriogram despite our conversation on loss of life or limb.  He states that his wife has passed away, he does not have family and it is just him.  He states that if he looses his left leg, he would not be able to get around and end up in a nursing home.  He states he wants to go home and pray about it and if it is the Lord's will to take him, he accepts that.   -I discussed with him that if podiatrist works on his ingrown toenail, that he is at high risk for non healing wounds and again limb loss.   -pt adamant that he does not want intervention despite risk of loss of life or limb.  He will call us if he changes his mind.     Leontine Locket, [facility] [phone]  Clinic MD:   Trula Slade

## 2020-09-14 NOTE — H&P (Addendum)
Surgical History & Physical  Patient Name: Marcus Hendrix DOB: 02-07-1940  Surgery: Cataract extraction with intraocular lens implant phacoemulsification; Right Eye  Surgeon: Baruch Goldmann MD Surgery Date:  09/29/2020 Pre-Op Date:  09/14/2020  HPI: A 60 Yr. old male patient is referred by [name] for cataract eval. 1. The patient complains of difficulty when recognizing people, which began many years ago. Both eyes are affected. The episode is gradual. The condition's severity increased since last visit. Symptoms occur when the patient is driving, inside, outside and reading. Pt stopped driving due to blurry vision in both eyes. This is negatively affecting his quality of life. HPI Completed by Dr. Baruch Goldmann  Medical History: Cataracts KG:[id number] non-prolif diabetic retinopathy, OU: Arcus, OS: Phacomorphic glaucoma, XY:[id number] Diabetes Elevated Cholesterol Heart Problem High Blood Pressure  Review of Systems Negative Allergic/Immunologic Negative Cardiovascular Negative Constitutional Negative Ear, Nose, Mouth & Throat Negative Endocrine Negative Eyes Negative Gastrointestinal Negative Genitourinary Negative Hemotologic/Lymphatic Negative Integumentary Negative Musculoskeletal Negative Neurological Negative Psychiatry Negative Respiratory  Social   Former smoker   Medication Timolol,  Aspirin, Cholesterol med., Fluid pill, Heart med, Hypertension med, Lantus insulin,   Sx/Procedures Sx. to help save vision x 15+ years,  Open Heart Bypass (twice), Leg Amputated L Below Knee,   Drug Allergies   NKDA  History & Physical: Heent:  Cataract, Right eye NECK: supple without bruits LUNGS: lungs clear to auscultation CV: regular rate and rhythm Abdomen: soft and non-tender  Impression & Plan: Assessment: 1.  COMBINED FORMS AGE RELATED CATARACT; Right Eye (H25.811) 2.  BLEPHARITIS; Right Upper Lid, Right Lower Lid, Left Upper Lid, Left Lower Lid (H01.001,  H01.002,H01.004,H01.005) 3.  DERMATOCHALASIS, no surgery; Right Upper Lid, Left Upper Lid (H02.831, H02.834) 4.  Pinguecula; Both Eyes (H11.153) 5.  BLINDNESS LEFT EYE CATEGORY 5, NORMAL VISION RIGHT EYE (H54.42A5)  Plan: 1.  Cataract accounts for the patient's decreased vision. This visual impairment is not correctable with a tolerable change in glasses or contact lenses. Cataract surgery with an implantation of a new lens should significantly improve the visual and functional status of the patient. Discussed all risks, benefits, alternatives, and potential complications. Discussed the procedures and recovery. Patient desires to have surgery. A-scan ordered and performed today for intra-ocular lens calculations. The surgery will be performed in order to improve vision for driving, reading, and for eye examinations. Recommend phacoemulsification with intra-ocular lens. Recommend Dextenza for post-operative pain and inflammation. Right Eye. Dilates well - shugarcaine by protocol. 2.  Begin/continue lid scrubs. 3.  Asymptomatic, recommend observation for now. Findings, prognosis and treatment options reviewed. 4.  Observe; Artificial tears as needed for irritation. 5.  Monocular precautions discussed, including wearing shatterproof lenses.

## 2020-09-27 ENCOUNTER — Other Ambulatory Visit (HOSPITAL_COMMUNITY): Payer: Medicare Other

## 2020-09-27 ENCOUNTER — Encounter (HOSPITAL_COMMUNITY): Payer: Medicare Other

## 2020-11-07 NOTE — H&P (Signed)
Surgical History & Physical  Patient Name: Marcus Hendrix DOB: 02-07-1940  Surgery: Cataract extraction with intraocular lens implant phacoemulsification; Right Eye  Surgeon: Baruch Goldmann MD Surgery Date:  11/27/2020 Pre-Op Date:  11/07/2020  HPI: A 81 Yr. old male patient is referred by [name] for cataract eval. 1. The patient complains of difficulty when recognizing people, which began many years ago. Both eyes are affected. The episode is gradual. The condition's severity increased since last visit. Symptoms occur when the patient is driving, inside, outside and reading. Pt stopped driving due to blurry vision in both eyes. This is negatively affecting his quality of life. HPI Completed by Dr. Baruch Goldmann  Medical History: Cataracts MA:[id number] non-prolif diabetic retinopathy, OU: Arcus, OS: Phacomorphic glaucoma, GY:[id number] Diabetes Elevated Cholesterol Heart Problem High Blood Pressure  Review of Systems Negative Allergic/Immunologic Negative Cardiovascular Negative Constitutional Negative Ear, Nose, Mouth & Throat Negative Endocrine Negative Eyes Negative Gastrointestinal Negative Genitourinary Negative Hemotologic/Lymphatic Negative Integumentary Negative Musculoskeletal Negative Neurological Negative Psychiatry Negative Respiratory  Social   Former smoker   Medication Timolol,  Aspirin, Cholesterol med., Fluid pill, Heart med, Hypertension med, Lantus insulin,   Sx/Procedures Sx. to help save vision x 15+ years,  Open Heart Bypass (twice), Leg Amputated L Below Knee,   Drug Allergies   NKDA  History & Physical: Heent:  Cataract, Right eye NECK: supple without bruits LUNGS: lungs clear to auscultation CV: regular rate and rhythm Abdomen: soft and non-tender  Impression & Plan: Assessment: 1.  COMBINED FORMS AGE RELATED CATARACT; Right Eye (H25.811) 2.  BLEPHARITIS; Right Upper Lid, Right Lower Lid, Left Upper Lid, Left Lower Lid (H01.001,  H01.002,H01.004,H01.005) 3.  DERMATOCHALASIS, no surgery; Right Upper Lid, Left Upper Lid (H02.831, H02.834) 4.  Pinguecula; Both Eyes (H11.153) 5.  BLINDNESS LEFT EYE CATEGORY 5, NORMAL VISION RIGHT EYE (H54.42A5)  Plan: 1.  Cataract accounts for the patient's decreased vision. This visual impairment is not correctable with a tolerable change in glasses or contact lenses. Cataract surgery with an implantation of a new lens should significantly improve the visual and functional status of the patient. Discussed all risks, benefits, alternatives, and potential complications. Discussed the procedures and recovery. Patient desires to have surgery. A-scan ordered and performed today for intra-ocular lens calculations. The surgery will be performed in order to improve vision for driving, reading, and for eye examinations. Recommend phacoemulsification with intra-ocular lens. Recommend Dextenza for post-operative pain and inflammation. Right Eye. Dilates well - shugarcaine by protocol. 2.  Begin/continue lid scrubs. 3.  Asymptomatic, recommend observation for now. Findings, prognosis and treatment options reviewed. 4.  Observe; Artificial tears as needed for irritation. 5.  Monocular precautions discussed, including wearing shatterproof lenses.

## 2020-11-17 ENCOUNTER — Telehealth: Payer: Self-pay

## 2020-11-17 NOTE — Telephone Encounter (Signed)
Spoke to [place] from Dr. Vickey Sages office regarding Dr. Karie Kirks wanting pt to be seen by MD for possible limb loss/L toe cellulitis. Pt is in agreement for possible intervention and wanted to be seen here again. He has been made an MD appt on Monday; pt is aware and verbalized understanding.

## 2020-11-20 ENCOUNTER — Other Ambulatory Visit: Payer: Self-pay

## 2020-11-20 ENCOUNTER — Ambulatory Visit (INDEPENDENT_AMBULATORY_CARE_PROVIDER_SITE_OTHER): Payer: Medicare Other | Admitting: Surgery

## 2020-11-20 ENCOUNTER — Encounter: Payer: Self-pay | Admitting: Surgery

## 2020-11-20 VITALS — BP 157/80 | HR 59 | Temp 97.8°F | Resp 20 | Ht 74.0 in | Wt 185.0 lb

## 2020-11-20 DIAGNOSIS — I739 Peripheral vascular disease, unspecified: Secondary | ICD-10-CM

## 2020-11-20 DIAGNOSIS — I7025 Atherosclerosis of native arteries of other extremities with ulceration: Secondary | ICD-10-CM

## 2020-11-20 NOTE — Progress Notes (Signed)
Vascular and Vein Specialist of   Patient name: Marcus Hendrix MRN: [id number] DOB: 09-17-1941 Sex: male   REASON FOR VISIT:    Left foot wound  HISOTRY OF PRESENT ILLNESS:    Marcus Hendrix is a 79 y.o. male who is status post bilateral iliac artery stenting by Dr. Oneida Alar in 2018.  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  He now has wounds on his left leg that have been present for approximately 2 to 3 months.  He has been going to the wound center.  He has a significant amount of pain   Patient suffers from diabetes.  He has history of coronary artery disease, status post MI.  He is medically managed for hypertension with an ACE inhibitor.  He takes a statin for hypercholesterolemia.  He is a former smoker.  PAST MEDICAL HISTORY:   Past Medical History:  Diagnosis Date  . Cerebrovascular disease   . Diabetes mellitus   . Diabetes mellitus ([place])   . ED (erectile dysfunction)   . Glaucoma, left eye   . History of atherosclerotic cardiovascular disease   . History of myocardial infarction   . Hx of right BKA ([place])   . Hyperlipidemia   . Hypertension   . Inguinal hernia    Right  . Normocytic anemia   . Peripheral neuropathy   . PVD (peripheral vascular disease) ([place])   . Tubulovillous adenoma of colon      FAMILY HISTORY:   Family History  Problem Relation Age of Onset  . Heart attack Mother   . Stroke Father   . Diabetes Sister   . HIV Brother   . Alcohol abuse Brother   . Diabetes Sister   . Heart attack Son     SOCIAL HISTORY:   Social History   Tobacco Use  . Smoking status: Former Smoker    Packs/day: 1.00    Years: 41.00    Pack years: 41.00    Types: Cigarettes    Start date: 02/06/1957    Quit date: 12/16/1997    Years since quitting: 22.9  . Smokeless tobacco: Current User    Types: Chew  . Tobacco comment: Chews tobacco occasionally  Substance Use Topics  . Alcohol use: No     Alcohol/week: 0.0 standard drinks     ALLERGIES:   Allergies  Allergen Reactions  . Bidil [Isosorb Dinitrate-Hydralazine]     PASSED OUT BUT NOT SURE IF THIS WAS THE CAUSE     CURRENT MEDICATIONS:   Current Outpatient Medications  Medication Sig Dispense Refill  . Amino Acids-Protein Hydrolys (FEEDING SUPPLEMENT, PRO-STAT SUGAR FREE 64,) LIQD Take 30 mLs by mouth 2 (two) times daily. 900 mL 0  . amLODipine (NORVASC) 10 MG tablet Take 10 mg by mouth daily.      [name] aspirin 81 MG tablet Take 81 mg by mouth daily.    [name] atorvastatin (LIPITOR) 80 MG tablet TAKE (1) TABLET BY MOUTH ONCE DAILY. (Patient taking differently: Take 80 mg by mouth daily. ) 90 tablet 0  . carvedilol (COREG) 12.5 MG tablet Take 1 tablet (12.5 mg total) by mouth 2 (two) times daily with a meal. 60 tablet 0  . cephALEXin (KEFLEX) 500 MG capsule Take 1 capsule by mouth 4 (four) times daily.   0  . doxazosin (CARDURA) 4 MG tablet SMARTSIG:1 Tablet(s) By Mouth Every Evening    . furosemide (LASIX) 40 MG tablet Take 40 mg by mouth daily.      [name]  gabapentin (NEURONTIN) 100 MG capsule Take 2 capsules (200 mg total) by mouth 3 (three) times daily. 180 capsule 0  . guaiFENesin-dextromethorphan (ROBITUSSIN DM) 100-10 MG/5ML syrup Take 5 mLs by mouth every 4 (four) hours as needed for cough. 118 mL 0  . Hydrocortisone (GERHARDT'S BUTT CREAM) CREA Apply 1 application topically 3 (three) times daily. 1 each 0  . insulin glargine (LANTUS) 100 UNIT/ML injection Inject 0.15 mLs (15 Units total) into the skin every morning. 10 mL 0  . lisinopril (PRINIVIL,ZESTRIL) 20 MG tablet TAKE ONE TABLET BY MOUTH TWICE DAILY. 90 tablet 3  . metoprolol tartrate (LOPRESSOR) 100 MG tablet Take 1 tablet by mouth 2 (two) times daily.  2  . nitroGLYCERIN (NITROSTAT) 0.4 MG SL tablet Place 0.4 mg under the tongue every 5 (five) minutes as needed for chest pain.   10  . oxyCODONE-acetaminophen (PERCOCET/ROXICET) 5-325 MG tablet Take 1 tablet by mouth  every 6 (six) hours as needed for severe pain. 12 tablet 0  . polyethylene glycol powder (GLYCOLAX/MIRALAX) powder Take 17 g by mouth daily. 255 g 0  . potassium chloride SA (K-DUR,KLOR-CON) 20 MEQ tablet Take 20 mEq by mouth daily.    . timolol (TIMOPTIC) 0.25 % ophthalmic solution Place 1 drop into the left eye every morning.     . timolol (TIMOPTIC) 0.5 % ophthalmic solution Place 1 drop into the left eye at bedtime.    . vitamin B-12 (CYANOCOBALAMIN) 1000 MCG tablet Take 1,000 mcg by mouth daily.      No current facility-administered medications for this visit.    REVIEW OF SYSTEMS:   [X]  denotes positive finding, [ ]  denotes negative finding Cardiac  Comments:  Chest pain or chest pressure:    Shortness of breath upon exertion:    Short of breath when lying flat:    Irregular heart rhythm:        Vascular    Pain in calf, thigh, or hip brought on by ambulation:    Pain in feet at night that wakes you up from your sleep:  x   Blood clot in your veins:    Leg swelling:         Pulmonary    Oxygen at home:    Productive cough:     Wheezing:         Neurologic    Sudden weakness in arms or legs:     Sudden numbness in arms or legs:     Sudden onset of difficulty speaking or slurred speech:    Temporary loss of vision in one eye:     Problems with dizziness:         Gastrointestinal    Blood in stool:     Vomited blood:         Genitourinary    Burning when urinating:     Blood in urine:        Psychiatric    Major depression:         Hematologic    Bleeding problems:    Problems with blood clotting too easily:        Skin    Rashes or ulcers: x       Constitutional    Fever or chills:      PHYSICAL EXAM:   Vitals:   11/20/20 1427  BP: (!) 157/80  Pulse: (!) 59  Resp: 20  Temp: 97.8 F (36.6 C)  SpO2: 93%  Weight: 185 lb (83.9 kg)  Height: 6'  2" (1.88 m)    GENERAL: The patient is a well-nourished male, in no acute distress. The vital signs are  documented above. CARDIAC: There is a regular rate and rhythm.  VASCULAR: Nonpalpable left pedal pulse PULMONARY: Non-labored respirations ABDOMEN: Soft and non-tender with normal pitched bowel sounds.  MUSCULOSKELETAL: There are no major deformities or cyanosis. NEUROLOGIC: No focal weakness or paresthesias are detected. SKIN: See photo below PSYCHIATRIC: The patient has a normal affect.      STUDIES:   I have reviewed the following:             absent       +---------+------------------+-----+--------+-------+  PTA               absent       +---------+------------------+-----+--------+-------+  PERO               absent       +---------+------------------+-----+--------+-------+  Great Toe            Absent       +---------+------------------+-----+--------+-------+  No detectable pedal artery waveforms or great toe PPG waveform.    MEDICAL ISSUES:   Diabetic left foot infection: By ultrasound, the patient does not have blood flow to his left foot.  I discussed with him that he is at high risk for amputation.  I offered to proceed with angiography to see if he has any options for revascularization in an attempt at limb salvage.  I will try to arrange this in the near future via a right femoral approach.  All his questions were answered.    Leia Alf, MD, FACS [facility] [phone] Pager [phone]

## 2020-11-20 NOTE — Patient Instructions (Signed)
Marcus Hendrix  11/20/2020     @PREFPERIOPPHARMACY @   Your procedure is scheduled on 11/27/2020.  Report to [facility] at  0730 A.M.  Call this number if you have problems the morning of surgery:  [phone]   Remember:  Do not eat or drink after midnight.                       Take these medicines the morning of surgery with A SIP OF WATER  Amlodipine, carvedilol, gabapentin, metoprolol, oxucodone(if needed). DO NOT take any medications for diabetes the morning of your procedure.    Do not wear jewelry, make-up or nail polish.  Do not wear lotions, powders, or perfumes. Please wear deodorant and brush your teeth.  Do not shave 48 hours prior to surgery.  Men may shave face and neck.  Do not bring valuables to the hospital.  [facility] is not responsible for any belongings or valuables.  Contacts, dentures or bridgework may not be worn into surgery.  Leave your suitcase in the car.  After surgery it may be brought to your room.  For patients admitted to the hospital, discharge time will be determined by your treatment team.  Patients discharged the day of surgery will not be allowed to drive home.   Name and phone number of your driver:   family Special instructions:  DO NOT smoke the morning of your procedure.  Please read over the following fact sheets that you were given. Anesthesia Post-op Instructions and Care and Recovery After Surgery      Cataract Surgery, Care After This sheet gives you information about how to care for yourself after your procedure. Your health care provider may also give you more specific instructions. If you have problems or questions, contact your health care provider. What can I expect after the procedure? After the procedure, it is common to have:  Itching.  Discomfort.  Fluid discharge.  Sensitivity to light and to touch.  Bruising in or around the eye.  Mild blurred vision. Follow these instructions at home: Eye  care   Do not touch or rub your eyes.  Protect your eyes as told by your health care provider. You may be told to wear a protective eye shield or sunglasses.  Do not put a contact lens into the affected eye or eyes until your health care provider approves.  Keep the area around your eye clean and dry: ? Avoid swimming. ? Do not allow water to hit you directly in the face while showering. ? Keep soap and shampoo out of your eyes.  Check your eye every day for signs of infection. Watch for: ? Redness, swelling, or pain. ? Fluid, blood, or pus. ? Warmth. ? A bad smell. ? Vision that is getting worse. ? Sensitivity that is getting worse. Activity  Do not drive for 24 hours if you were given a sedative during your procedure.  Avoid strenuous activities, such as playing contact sports, for as long as told by your health care provider.  Do not drive or use heavy machinery until your health care provider approves.  Do not bend or lift heavy objects. Bending increases pressure in the eye. You can walk, climb stairs, and do light household chores.  Ask your health care provider when you can return to work. If you work in a dusty environment, you may be advised to wear protective eyewear for a period of time. General instructions  Take or apply over-the-counter and prescription medicines only as told by your health care provider. This includes eye drops.  Keep all follow-up visits as told by your health care provider. This is important. Contact a health care provider if:  You have increased bruising around your eye.  You have pain that is not helped with medicine.  You have a fever.  You have redness, swelling, or pain in your eye.  You have fluid, blood, or pus coming from your incision.  Your vision gets worse.  Your sensitivity to light gets worse. Get help right away if:  You have sudden loss of vision.  You see flashes of light or spots (floaters).  You have severe  eye pain.  You develop nausea or vomiting. Summary  After your procedure, it is common to have itching, discomfort, bruising, fluid discharge, or sensitivity to light.  Follow instructions from your health care provider about caring for your eye after the procedure.  Do not rub your eye after the procedure. You may need to wear eye protection or sunglasses. Do not wear contact lenses. Keep the area around your eye clean and dry.  Avoid activities that require a lot of effort. These include playing sports and lifting heavy objects.  Contact a health care provider if you have increased bruising, pain that does not go away, or a fever. Get help right away if you suddenly lose your vision, see flashes of light or spots, or have severe pain in the eye. This information is not intended to replace advice given to you by your health care provider. Make sure you discuss any questions you have with your health care provider. Document Revised: 09/28/2019 Document Reviewed: 06/01/2018 Elsevier Patient Education  [place].

## 2020-11-23 ENCOUNTER — Emergency Department (HOSPITAL_COMMUNITY)
Admission: EM | Admit: 2020-11-23 | Discharge: 2020-12-16 | Disposition: E | Payer: Medicare Other | Attending: Emergency Medicine | Admitting: Emergency Medicine

## 2020-11-23 DIAGNOSIS — Z79899 Other long term (current) drug therapy: Secondary | ICD-10-CM | POA: Diagnosis not present

## 2020-11-23 DIAGNOSIS — I469 Cardiac arrest, cause unspecified: Secondary | ICD-10-CM | POA: Insufficient documentation

## 2020-11-23 DIAGNOSIS — Z794 Long term (current) use of insulin: Secondary | ICD-10-CM | POA: Diagnosis not present

## 2020-11-23 DIAGNOSIS — I110 Hypertensive heart disease with heart failure: Secondary | ICD-10-CM | POA: Diagnosis not present

## 2020-11-23 DIAGNOSIS — Z87891 Personal history of nicotine dependence: Secondary | ICD-10-CM | POA: Insufficient documentation

## 2020-11-23 DIAGNOSIS — E1140 Type 2 diabetes mellitus with diabetic neuropathy, unspecified: Secondary | ICD-10-CM | POA: Insufficient documentation

## 2020-11-23 DIAGNOSIS — Z951 Presence of aortocoronary bypass graft: Secondary | ICD-10-CM | POA: Insufficient documentation

## 2020-11-23 DIAGNOSIS — I5042 Chronic combined systolic (congestive) and diastolic (congestive) heart failure: Secondary | ICD-10-CM | POA: Diagnosis not present

## 2020-11-23 DIAGNOSIS — I2510 Atherosclerotic heart disease of native coronary artery without angina pectoris: Secondary | ICD-10-CM | POA: Insufficient documentation

## 2020-11-23 DIAGNOSIS — Z7982 Long term (current) use of aspirin: Secondary | ICD-10-CM | POA: Insufficient documentation

## 2020-11-23 LAB — CBG MONITORING, ED: Glucose-Capillary: 111 mg/dL — ABNORMAL HIGH (ref 70–99)

## 2020-11-24 ENCOUNTER — Encounter (HOSPITAL_COMMUNITY): Payer: Self-pay

## 2020-11-24 ENCOUNTER — Other Ambulatory Visit (HOSPITAL_COMMUNITY): Payer: Medicare Other

## 2020-11-24 ENCOUNTER — Encounter (HOSPITAL_COMMUNITY): Admission: RE | Admit: 2020-11-24 | Payer: Medicare Other | Source: Ambulatory Visit

## 2020-11-24 MED FILL — Medication: Qty: 1 | Status: AC

## 2020-11-27 ENCOUNTER — Ambulatory Visit (HOSPITAL_COMMUNITY): Admission: RE | Admit: 2020-11-27 | Payer: Medicare Other | Source: Home / Self Care | Admitting: Ophthalmology

## 2020-11-27 SURGERY — PHACOEMULSIFICATION, CATARACT, WITH IOL INSERTION
Anesthesia: Monitor Anesthesia Care | Laterality: Right

## 2020-12-04 ENCOUNTER — Other Ambulatory Visit (HOSPITAL_COMMUNITY): Payer: Medicare Other

## 2020-12-05 ENCOUNTER — Ambulatory Visit (HOSPITAL_COMMUNITY): Admit: 2020-12-05 | Payer: Medicare Other | Admitting: Surgery

## 2020-12-05 SURGERY — ABDOMINAL AORTOGRAM W/LOWER EXTREMITY
Laterality: Left

## 2020-12-16 NOTE — ED Notes (Signed)
Pt taken to [facility].

## 2020-12-16 NOTE — ED Triage Notes (Addendum)
Called to front by clerical staff stating pt in car and passed out. Pt found to be slumped over to right side. Sweaty. Pt is in aystole and not breathing. Pt pulled from car and placed on stretcher. CPR initiated. Pt taken to room with DrZammit. Pt was with nephew and they had gone to grocery store and nephew stated he passed out

## 2020-12-16 NOTE — ED Notes (Signed)
Epi administered via ET tube. Remains in asystole

## 2020-12-16 NOTE — ED Notes (Signed)
3rd shock adm.at 120 j.  No pulse Cpr  restarted

## 2020-12-16 NOTE — ED Provider Notes (Signed)
Procedure Name: Intubation Date/Time: December 20, 2020 10:38 AM Performed by: Deliah Boston, PA-C Pre-anesthesia Checklist: Patient identified, Emergency Drugs available, Suction available, Patient being monitored and Timeout performed Oxygen Delivery Method: Ambu bag Induction Type: Cricoid Pressure applied Ventilation: Two handed mask ventilation required Laryngoscope Size: Glidescope and 4 Grade View: Grade II Tube size: 7.5 mm Number of attempts: 1 Airway Equipment and Method: Rigid stylet and Video-laryngoscopy Placement Confirmation: ETT inserted through vocal cords under direct vision,  Positive ETCO2 and Breath sounds checked- equal and bilateral Secured at: 25 cm Tube secured with: ETT holder Dental Injury: Teeth and Oropharynx as per pre-operative assessment  Difficulty Due To: Difficulty was anticipated and Difficult Airway- due to large tongue Comments: Supervision by Dr. Barbie Haggis, Camillia Herter, PA-C 12-20-20 1042    Milton Ferguson, MD 11/24/20 1026

## 2020-12-16 NOTE — ED Provider Notes (Signed)
[facility] Provider Note   CSN: [id number] Arrival date & time: December 17, 2020  1021     History Chief Complaint  Patient presents with  . Cardiac Arrest    Marcus Hendrix is a 80 y.o. male.  Patient was in the car with his nephew after shopping and fell over he was not breathing.  His nephew brought him to the emergency department.  He was pulled out of the car and was unresponsive not breathing and no heartbeat  The history is provided by medical records and a relative.  Cardiac Arrest Witnessed by:  Friend Incident location:  En route to the ED Time since incident:  6 minutes Time before BLS initiated:  > 5 minutes Time before ALS initiated:  5-8 minutes Condition upon EMS arrival:  Apneic Pulse:  Absent Initial cardiac rhythm per EMS:  Asystole Treatments prior to arrival:  ACLS protocol Medications given prior to ED:  Epinephrine Airway:  Intubation in ED      Past Medical History:  Diagnosis Date  . Cerebrovascular disease   . Diabetes mellitus   . Diabetes mellitus ([place])   . ED (erectile dysfunction)   . Glaucoma, left eye   . History of atherosclerotic cardiovascular disease   . History of myocardial infarction   . Hx of right BKA ([place])   . Hyperlipidemia   . Hypertension   . Inguinal hernia    Right  . Normocytic anemia   . Peripheral neuropathy   . PVD (peripheral vascular disease) ([place])   . Tubulovillous adenoma of colon     Patient Active Problem List   Diagnosis Date Noted  . Pain of right lower extremity   . Type 2 diabetes mellitus with peripheral neuropathy (HCC)   . Benign essential HTN   . Coronary artery disease involving coronary bypass graft of native heart without angina pectoris   . Chronic combined systolic and diastolic congestive heart failure ([place])   . Leukocytosis   . Transaminitis   . Unilateral AKA, right ([place])   . Cardiac arrest ([place])   . Bradycardia 12/06/2017  . History of atherosclerotic  cardiovascular disease 12/06/2017  . Diabetic foot infection ([place]) 11/28/2017  . Acute CHF (congestive heart failure) ([place]) 11/28/2017  . Rectal bleeding 10/04/2013  . Heme positive stool 10/04/2013  . Community acquired pneumonia 07/30/2013  . DM type 2 (diabetes mellitus, type 2) ([place]) 07/30/2013  . HTN (hypertension), benign 07/30/2013  . CAROTID BRUITS, BILATERAL 07/16/2010  . MITRAL VALVE PROLAPSE 08/22/2009  . DM 07/22/2009  . Atherosclerotic cardiovascular disease 07/22/2009  . 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 ERECTILE DYSFUNCTION 05/27/2008  . INGUINAL HERNIA, RIGHT 10/19/2007  . ANEMIA, NORMOCYTIC 10/09/2007  . Dyslipidemia 12/19/2006  . PERIPHERAL NEUROPATHY 12/19/2006  . Unspecified glaucoma 12/19/2006  . Essential hypertension 12/19/2006  . MYOCARDIAL INFARCTION, HX OF 12/19/2006  . PERIPHERAL VASCULAR DISEASE 12/19/2006    Past Surgical History:  Procedure Laterality Date  . AMPUTATION Right 12/12/2017   Procedure: AMPUTATION ABOVE KNEE RIGHT;  Surgeon: Serafina Mitchell, MD;  Location: [facility];  Service: Vascular;  Laterality: Right;  . CHOLECYSTECTOMY    . COLONOSCOPY [name] 07/22/2014   Procedure: COLONOSCOPY;  Surgeon: [name], MD;  [facility];  Service: Endoscopy;  Laterality: [name];  1235-moved to [place] notified pt  . CORONARY ARTERY BYPASS GRAFT  2002   x6  . INGUINAL HERNIA REPAIR  2004   Left  . LOWER EXTREMITY ANGIOGRAPHY [name] 12/08/2017  Procedure: LOWER EXTREMITY ANGIOGRAPHY;  Surgeon: Elam Dutch, MD;  Location: [facility];  Service: Cardiovascular;  Laterality: [name];  . PERIPHERAL VASCULAR INTERVENTION Bilateral 12/08/2017   Procedure: PERIPHERAL VASCULAR INTERVENTION;  Surgeon: Elam Dutch, MD;  Location: [facility];  Service: Cardiovascular;  Laterality: Bilateral;  [place]       Family History  Problem Relation Age of Onset  . Heart attack Mother   . Stroke Father   . Diabetes Sister   .  HIV Brother   . Alcohol abuse Brother   . Diabetes Sister   . Heart attack Son     Social History   Tobacco Use  . Smoking status: Former Smoker    Packs/day: 1.00    Years: 41.00    Pack years: 41.00    Types: Cigarettes    Start date: 02/06/1957    Quit date: 12/16/1997    Years since quitting: 22.9  . Smokeless tobacco: Current User    Types: Chew  . Tobacco comment: Chews tobacco occasionally  Vaping Use  . Vaping Use: Never used  Substance Use Topics  . Alcohol use: No    Alcohol/week: 0.0 standard drinks  . Drug use: No    Home Medications Prior to Admission medications   Medication Sig Start Date End Date Taking? Authorizing Provider  Amino Acids-Protein Hydrolys (FEEDING SUPPLEMENT, PRO-STAT SUGAR FREE 64,) LIQD Take 30 mLs by mouth 2 (two) times daily. 12/19/17   Lavina Hamman, MD  amLODipine (NORVASC) 10 MG tablet Take 10 mg by mouth daily.      [provider]  aspirin 81 MG tablet Take 81 mg by mouth daily.    [provider]  atorvastatin (LIPITOR) 80 MG tablet TAKE (1) TABLET BY MOUTH ONCE DAILY. Patient taking differently: Take 80 mg by mouth daily.  01/16/18   Arnoldo Lenis, MD  carvedilol (COREG) 12.5 MG tablet Take 1 tablet (12.5 mg total) by mouth 2 (two) times daily with a meal. 12/19/17   Lavina Hamman, MD  cephALEXin (KEFLEX) 500 MG capsule Take 1 capsule by mouth 4 (four) times daily.  12/03/17   [provider]  doxazosin (CARDURA) 4 MG tablet Take 4 mg by mouth daily.  09/06/20   [provider]  furosemide (LASIX) 40 MG tablet Take 40 mg by mouth daily.      [provider]  gabapentin (NEURONTIN) 100 MG capsule Take 2 capsules (200 mg total) by mouth 3 (three) times daily. 12/19/17   Lavina Hamman, MD  guaiFENesin-dextromethorphan (ROBITUSSIN DM) 100-10 MG/5ML syrup Take 5 mLs by mouth every 4 (four) hours as needed for cough. 11/29/17   Johnson, Clanford L, MD  Hydrocortisone (GERHARDT'S BUTT CREAM) CREA  Apply 1 application topically 3 (three) times daily. 12/19/17   Lavina Hamman, MD  insulin glargine (LANTUS) 100 UNIT/ML injection Inject 0.15 mLs (15 Units total) into the skin every morning. 12/19/17   Lavina Hamman, MD  lisinopril (PRINIVIL,ZESTRIL) 20 MG tablet TAKE ONE TABLET BY MOUTH TWICE DAILY. 01/18/14   Lendon Colonel, NP  metoprolol tartrate (LOPRESSOR) 100 MG tablet Take 1 tablet by mouth 2 (two) times daily. 11/14/17   [provider]  nitroGLYCERIN (NITROSTAT) 0.4 MG SL tablet Place 0.4 mg under the tongue every 5 (five) minutes as needed for chest pain.  04/15/16   [provider]  oxyCODONE-acetaminophen (PERCOCET/ROXICET) 5-325 MG tablet Take 1 tablet by mouth every 6 (six) hours as  needed for severe pain. 12/19/17   Lavina Hamman, MD  polyethylene glycol powder (GLYCOLAX/MIRALAX) powder Take 17 g by mouth daily. 11/29/17   Johnson, Clanford L, MD  potassium chloride SA (K-DUR,KLOR-CON) 20 MEQ tablet Take 20 mEq by mouth daily.    [provider]  timolol (TIMOPTIC) 0.25 % ophthalmic solution Place 1 drop into the left eye every morning.     [provider]  timolol (TIMOPTIC) 0.5 % ophthalmic solution Place 1 drop into the left eye at bedtime. 07/28/20   [provider]  vitamin B-12 (CYANOCOBALAMIN) 1000 MCG tablet Take 1,000 mcg by mouth daily.     [provider]    Allergies    Bidil [isosorb dinitrate-hydralazine]  Review of Systems   Review of Systems  Unable to perform ROS: Acuity of condition    Physical Exam Updated Vital Signs There were no vitals taken for this visit.  Physical Exam Vitals and nursing note reviewed.  Constitutional:      Appearance: He is well-developed. He is ill-appearing.  HENT:     Head: Normocephalic.     Ears:     Comments: Patient did not have any heartbeat    Nose: Nose normal.  Eyes:     General: No scleral icterus.    Extraocular Movements: EOM normal.      Conjunctiva/sclera: Conjunctivae normal.  Neck:     Thyroid: No thyromegaly.  Cardiovascular:     Comments: Patient is an ventricular fib Pulmonary:     Breath sounds: No stridor. No wheezing or rales.     Comments: Patient was being bagged Chest:     Chest wall: No tenderness.  Abdominal:     General: There is no distension.     Tenderness: There is no abdominal tenderness. There is no rebound.  Musculoskeletal:        General: No swelling or edema.     Cervical back: Neck supple.  Lymphadenopathy:     Cervical: No cervical adenopathy.  Skin:    Findings: No erythema or rash.  Neurological:     Motor: No abnormal muscle tone.     Coordination: Coordination normal.     Comments: Patient unresponsive  Psychiatric:        Mood and Affect: Mood and affect normal.     Comments: Patient unresponsive    ,edcr CRITICAL CARE Performed by: Milton Ferguson Total critical care time:35 minutes Critical care time was exclusive of separately billable procedures and treating other patients. Critical care was necessary to treat or prevent imminent or life-threatening deterioration. Critical care was time spent personally by me on the following activities: development of treatment plan with patient and/or surrogate as well as nursing, discussions with consultants, evaluation of patient's response to treatment, examination of patient, obtaining history from patient or surrogate, ordering and performing treatments and interventions, ordering and review of laboratory studies, ordering and review of radiographic studies, pulse oximetry and re-evaluation of patient's condition.  ED Results / Procedures / Treatments   Labs (all labs ordered are listed, but only abnormal results are displayed) Labs Reviewed  CBG MONITORING, ED - Abnormal; Notable for the following components:      Result Value   Glucose-Capillary 111 (*)    All other components within normal limits    EKG None  Radiology No  results found.  Procedures Procedures (including critical care time)  Medications Ordered in ED Medications - No data to display  ED Course  I have reviewed  the triage vital signs and the nursing notes.  Pertinent labs & imaging results that were available during my care of the patient were reviewed by me and considered in my medical decision making (see chart for details). Patient was unresponsive without a heartbeat when he arrived.  He was in V. fib.  Patient was shocked approximately 4 times and given epinephrine but never regained pulse back he did get into a slow PEA rate that did not last.  Patient was intubated and we continued ACLS without results.  He was pronounced dead at 10:36 AM.  I spoke with his physician Dr. Karie Kirks and he will take care of the death of significant.  The patient had significant coronary artery disease with bypass surgery and congestive heart failure   MDM Rules/Calculators/A&P                          Cardiac arrest with death. Final Clinical Impression(s) / ED Diagnoses Final diagnoses:  None    Rx / DC Orders ED Discharge Orders    None       Milton Ferguson, MD 2020/12/19 1255

## 2020-12-16 NOTE — ED Notes (Signed)
[place] donor notifed

## 2020-12-16 NOTE — ED Notes (Signed)
Dr Dewayne Hatch gave order to stop CPR. Time of death 23

## 2020-12-16 NOTE — ED Notes (Signed)
No pulse noted Remains v fib Nurses attempting IV all sites blowing. CPR resumed

## 2020-12-16 NOTE — ED Notes (Signed)
Pt remains in vfib 2nd shock delivered at 120 j No change in rhythm Cpr restarted

## 2020-12-16 NOTE — ED Notes (Signed)
Pt hooked up to zoll . Noted to be in v fib. Shock delivered at 120 J CPR restated .

## 2020-12-16 NOTE — ED Notes (Signed)
Pt shocked at 120j No pulse, in asystole Pt intubated at this time

## 2020-12-16 DEATH — deceased
